# Patient Record
Sex: Male | Born: 1935 | Race: White | Hispanic: No | Marital: Single | Smoking: Former smoker
Health system: Southern US, Community
[De-identification: ages and names within clinical notes are randomized; demographics above are authoritative.]

## PROBLEM LIST (undated history)

## (undated) DIAGNOSIS — N529 Male erectile dysfunction, unspecified: Secondary | ICD-10-CM

## (undated) DIAGNOSIS — N189 Chronic kidney disease, unspecified: Secondary | ICD-10-CM

## (undated) DIAGNOSIS — I340 Nonrheumatic mitral (valve) insufficiency: Secondary | ICD-10-CM

## (undated) DIAGNOSIS — R972 Elevated prostate specific antigen [PSA]: Secondary | ICD-10-CM

## (undated) DIAGNOSIS — N289 Disorder of kidney and ureter, unspecified: Secondary | ICD-10-CM

## (undated) DIAGNOSIS — M549 Dorsalgia, unspecified: Secondary | ICD-10-CM

## (undated) DIAGNOSIS — I2510 Atherosclerotic heart disease of native coronary artery without angina pectoris: Secondary | ICD-10-CM

## (undated) DIAGNOSIS — I4891 Unspecified atrial fibrillation: Secondary | ICD-10-CM

## (undated) DIAGNOSIS — Z9889 Other specified postprocedural states: Secondary | ICD-10-CM

## (undated) DIAGNOSIS — M1990 Unspecified osteoarthritis, unspecified site: Secondary | ICD-10-CM

## (undated) DIAGNOSIS — I499 Cardiac arrhythmia, unspecified: Secondary | ICD-10-CM

## (undated) DIAGNOSIS — E291 Testicular hypofunction: Secondary | ICD-10-CM

## (undated) DIAGNOSIS — Z8679 Personal history of other diseases of the circulatory system: Secondary | ICD-10-CM

## (undated) DIAGNOSIS — I10 Essential (primary) hypertension: Secondary | ICD-10-CM

## (undated) DIAGNOSIS — R0600 Dyspnea, unspecified: Secondary | ICD-10-CM

## (undated) DIAGNOSIS — R400 Somnolence: Secondary | ICD-10-CM

## (undated) DIAGNOSIS — G629 Polyneuropathy, unspecified: Secondary | ICD-10-CM

## (undated) DIAGNOSIS — I071 Rheumatic tricuspid insufficiency: Secondary | ICD-10-CM

## (undated) DIAGNOSIS — R0602 Shortness of breath: Secondary | ICD-10-CM

## (undated) DIAGNOSIS — G8929 Other chronic pain: Secondary | ICD-10-CM

## (undated) DIAGNOSIS — I050 Rheumatic mitral stenosis: Secondary | ICD-10-CM

## (undated) DIAGNOSIS — K219 Gastro-esophageal reflux disease without esophagitis: Secondary | ICD-10-CM

## (undated) DIAGNOSIS — E785 Hyperlipidemia, unspecified: Secondary | ICD-10-CM

## (undated) DIAGNOSIS — I6529 Occlusion and stenosis of unspecified carotid artery: Secondary | ICD-10-CM

## (undated) DIAGNOSIS — J309 Allergic rhinitis, unspecified: Secondary | ICD-10-CM

## (undated) DIAGNOSIS — I517 Cardiomegaly: Secondary | ICD-10-CM

## (undated) DIAGNOSIS — J189 Pneumonia, unspecified organism: Secondary | ICD-10-CM

## (undated) DIAGNOSIS — G4700 Insomnia, unspecified: Secondary | ICD-10-CM

## (undated) DIAGNOSIS — H9190 Unspecified hearing loss, unspecified ear: Secondary | ICD-10-CM

## (undated) HISTORY — DX: Occlusion and stenosis of unspecified carotid artery: I65.29

## (undated) HISTORY — PX: HERNIA REPAIR: SHX51

## (undated) HISTORY — PX: CARDIAC CATHETERIZATION: SHX172

## (undated) HISTORY — DX: Somnolence: R40.0

## (undated) HISTORY — DX: Gastro-esophageal reflux disease without esophagitis: K21.9

## (undated) HISTORY — DX: Unspecified hearing loss, unspecified ear: H91.90

## (undated) HISTORY — DX: Rheumatic tricuspid insufficiency: I07.1

## (undated) HISTORY — DX: Male erectile dysfunction, unspecified: N52.9

## (undated) HISTORY — DX: Chronic kidney disease, unspecified: N18.9

## (undated) HISTORY — DX: Essential (primary) hypertension: I10

## (undated) HISTORY — DX: Cardiomegaly: I51.7

## (undated) HISTORY — DX: Dorsalgia, unspecified: M54.9

## (undated) HISTORY — DX: Elevated prostate specific antigen (PSA): R97.20

## (undated) HISTORY — DX: Nonrheumatic mitral (valve) insufficiency: I34.0

## (undated) HISTORY — DX: Other chronic pain: G89.29

## (undated) HISTORY — PX: LUMBAR SPINE SURGERY: SHX701

## (undated) HISTORY — PX: TONSILLECTOMY: SUR1361

## (undated) HISTORY — DX: Insomnia, unspecified: G47.00

## (undated) HISTORY — DX: Shortness of breath: R06.02

## (undated) HISTORY — DX: Unspecified atrial fibrillation: I48.91

## (undated) HISTORY — DX: Hyperlipidemia, unspecified: E78.5

## (undated) HISTORY — DX: Rheumatic mitral stenosis: I05.0

## (undated) HISTORY — DX: Testicular hypofunction: E29.1

## (undated) HISTORY — DX: Allergic rhinitis, unspecified: J30.9

---

## 1999-09-06 ENCOUNTER — Ambulatory Visit (HOSPITAL_COMMUNITY): Admission: RE | Admit: 1999-09-06 | Discharge: 1999-09-06 | Payer: Self-pay | Admitting: Gastroenterology

## 2002-07-11 ENCOUNTER — Ambulatory Visit (HOSPITAL_COMMUNITY): Admission: RE | Admit: 2002-07-11 | Discharge: 2002-07-11 | Payer: Self-pay | Admitting: Gastroenterology

## 2002-07-11 ENCOUNTER — Encounter: Payer: Self-pay | Admitting: Gastroenterology

## 2007-04-19 ENCOUNTER — Encounter: Admission: RE | Admit: 2007-04-19 | Discharge: 2007-04-19 | Payer: Self-pay | Admitting: Orthopaedic Surgery

## 2007-05-04 ENCOUNTER — Encounter: Admission: RE | Admit: 2007-05-04 | Discharge: 2007-05-04 | Payer: Self-pay | Admitting: Orthopaedic Surgery

## 2007-06-18 ENCOUNTER — Encounter: Admission: RE | Admit: 2007-06-18 | Discharge: 2007-06-18 | Payer: Self-pay | Admitting: Orthopaedic Surgery

## 2007-07-25 ENCOUNTER — Encounter: Admission: RE | Admit: 2007-07-25 | Discharge: 2007-07-25 | Payer: Self-pay | Admitting: Orthopaedic Surgery

## 2007-11-05 ENCOUNTER — Encounter: Admission: RE | Admit: 2007-11-05 | Discharge: 2007-11-05 | Payer: Self-pay | Admitting: Orthopaedic Surgery

## 2008-02-06 ENCOUNTER — Encounter: Admission: RE | Admit: 2008-02-06 | Discharge: 2008-02-06 | Payer: Self-pay | Admitting: Orthopaedic Surgery

## 2009-09-22 ENCOUNTER — Encounter: Payer: Self-pay | Admitting: Internal Medicine

## 2009-10-08 ENCOUNTER — Encounter: Payer: Self-pay | Admitting: Internal Medicine

## 2009-11-09 LAB — PULMONARY FUNCTION TEST

## 2009-12-30 ENCOUNTER — Encounter: Payer: Self-pay | Admitting: Internal Medicine

## 2010-01-22 ENCOUNTER — Encounter: Payer: Self-pay | Admitting: Internal Medicine

## 2010-01-29 ENCOUNTER — Encounter: Payer: Self-pay | Admitting: Internal Medicine

## 2010-02-02 ENCOUNTER — Encounter: Payer: Self-pay | Admitting: Internal Medicine

## 2010-02-09 ENCOUNTER — Encounter: Payer: Self-pay | Admitting: Internal Medicine

## 2010-02-17 ENCOUNTER — Ambulatory Visit: Payer: Self-pay | Admitting: Internal Medicine

## 2010-02-17 DIAGNOSIS — R609 Edema, unspecified: Secondary | ICD-10-CM

## 2010-02-17 DIAGNOSIS — I4891 Unspecified atrial fibrillation: Secondary | ICD-10-CM

## 2010-02-17 HISTORY — DX: Unspecified atrial fibrillation: I48.91

## 2010-02-23 ENCOUNTER — Telehealth: Payer: Self-pay | Admitting: Internal Medicine

## 2010-02-26 ENCOUNTER — Ambulatory Visit: Payer: Self-pay | Admitting: Internal Medicine

## 2010-02-26 ENCOUNTER — Ambulatory Visit (HOSPITAL_COMMUNITY): Admission: RE | Admit: 2010-02-26 | Discharge: 2010-02-26 | Payer: Self-pay | Admitting: Internal Medicine

## 2010-02-26 HISTORY — PX: CARDIOVERSION: SHX1299

## 2010-03-12 ENCOUNTER — Telehealth: Payer: Self-pay | Admitting: Internal Medicine

## 2010-04-13 ENCOUNTER — Ambulatory Visit: Payer: Self-pay | Admitting: Internal Medicine

## 2010-04-13 DIAGNOSIS — I440 Atrioventricular block, first degree: Secondary | ICD-10-CM

## 2010-10-28 NOTE — Assessment & Plan Note (Signed)
Summary: appt @ 4:30/nep/afib/jml   Visit Type:  Initial Consult Referring Provider:  Ruthann Cancer Primary Provider:  Beatriz Stallion  CC:  AFIB.  History of Present Illness: [name] this is a request of MIke Duran because of atrial fibrillation.  this is identified in December. He underwent Myoview scan and echo which were   normal.  with eEF 55% Blood was also obtained and that was also presumably normal. He was started on flecainide and Coumadin. He did not revert. He is now referred for consideration of cardioversion.  He has palpitations sometimes fast. There is accompanying shortness of breath. He was intolerant of it initially started beta blocker undertaken presumably for rate control. This is as with profound shortness of breath. This improved with the discontinuation initiation of Cardizem. However, this is a complicated by edema.  The patient spent a long time fussing about his medications.  Current Medications (verified): 1)  Coumadin 6 Mg Tabs (Warfarin Sodium) .... Uad 2)  Flecainide Acetate 50 Mg Tabs (Flecainide Acetate) .... 2 Tabs Two Times A Day 3)  Cardizem Cd 180 Mg Xr24h-Cap (Diltiazem Hcl Coated Beads) .... Once Daily 4)  Simvastatin 40 Mg Tabs (Simvastatin) .... At Bedtime 5)  Prilosec 20 Mg Cpdr (Omeprazole) .... Take One Capsule Two Times A Day 6)  Proscar 5 Mg Tabs (Finasteride) .... Take One Tablet Once Daily  Allergies (verified): No Known Drug Allergies  Past History:  Past Medical History: Last updated: 02/16/2010 atrial fibrillation CKD SOB erectile dysfunction left ventricular hypertrophy hypertension GERD Hyperlipidemia  Past Surgical History: hernia x2  Family History: Negative FH of Diabetes, Hypertension, or Coronary Artery Disease  Review of Systems       full review of systems was negative apart from a history of present illness and past medical history x glasses, hard of hearing,   Vital Signs:  Patient  profile:   76 year old male Height:      74 inches Weight:      250 pounds BMI:     32.21 Pulse rate:   77 / minute BP sitting:   145 / 93  (left arm) Cuff size:   regular  Vitals Entered By: Lubertha Basque, CNA (Feb 17, 2010 4:37 PM)  Physical Exam  General:  The patient was alert and oriented appearing his stated age in no acute distress.Neck veins were flat, carotids were brisk. Lungs were clear. Heart sounds were irregular with 2/6 murmurs no gallops. Abdomen was soft with active bowel sounds. There is no clubbing cyanosis or edema.affect normal skin warm and dry    EKG  Procedure date:  02/17/2010  Findings:      atiral fibrilaltion 77 LAD/ rsr   Impression & Recommendations:  Problem # 1:  ATRIAL FIBRILLATION (ICD-427.31) the patient has atrial fibrillation that has been persistent since December. Evaluation included an echo and a Myoview which were apparently normal. The patient has symptoms of shortness of breath. The plan is to undertake cardioversion with support from flecainide in hopes of restoring sinus rhythm. We have reviewed the benefits and the risks of cardioversion as well as antiarrhythmic drug therapy.  He has a CHADS VASC score of 2( age.-one, hypertension-one)  long-term Coumadin is appropriate Pradaxa would certainly be a reasonable consideration.  The following medications were removed from the medication list:    Carvedilol 6.25 Mg Tabs (Carvedilol) .Marland Kitchen... Take one tablet two times a day His updated medication list for this problem includes:  Coumadin 6 Mg Tabs (Warfarin sodium) ..... Uad    Flecainide Acetate 50 Mg Tabs (Flecainide acetate) .Marland Kitchen... 2 tabs two times a day  Problem # 2:  EDEMA (ICD-782.3) Edema may well be related to Cardizem. He probably did not tolerate a beta blocker in the past but alternative beta blocker may be a better alternative to Cardizem given the associated side effects. For right now however we will continue him on it.

## 2010-10-28 NOTE — Progress Notes (Signed)
Summary: QUESTIONS REGARDING MEDICATION  Phone Note Call from Patient Call back at Home Phone [phone]   Caller: Patient [phone] Reason for Call: Talk to Nurse Summary of Call: PT HAS QUESTIONS REGARDING FLECAINIDE 50MG  PT TAKING TWO IN AM, TWO AT NIGHT, WANTS TO GO BACK TO ONE IN AM, AND ONE AT NIGHT Initial call taken by: Lorenda Hatchet,  March 12, 2010 11:42 AM  Follow-up for Phone Call        spoke with pt he will continue 100mg  two times a day.  I also set him up for a [place] with Dr Dominic Pea, RN, BSN  March 12, 2010 12:32 PM    New/Updated Medications: FLECAINIDE ACETATE 100 MG TABS (FLECAINIDE ACETATE) one by mouth two times a day Prescriptions: FLECAINIDE ACETATE 100 MG TABS (FLECAINIDE ACETATE) one by mouth two times a day  #60 x 6   Entered by:   Janan Halter, RN, BSN   Authorized by:   Nikki Dom, MD, [facility]   Signed by:   Janan Halter, RN, BSN on 03/12/2010   Method used:   Electronically to        [facility]. [id number]* (retail)       [address]       [place], [place]  [address]       Ph: [phone] or [phone]       Fax: [phone]   RxID:   [id number]

## 2010-10-28 NOTE — Letter (Signed)
Summary: [facility] Cardiology Office Visit Note  [facility] Cardiology Office Visit Note   Imported By: Sallee Provencal 03/20/2010 09:42:43  _____________________________________________________________________  External Attachment:    Type:   Image     Comment:   External Document

## 2010-10-28 NOTE — Progress Notes (Signed)
Summary: set up cardioversion  Phone Note Call from Patient Call back at [place]: Patient Reason for Call: Talk to Nurse, Talk to Doctor Summary of Call: pt rtning call from last week to set up cardioversion. please call cell number it is the only number he can hear you on Initial call taken by: Shelda Pal,  Feb 23, 2010 9:33 AM  Follow-up for Phone Call        DCCV scheduled for 02-26-10 at [facility].  Instructions reviewed with patient. Chanetta Marshall RN BSN  Feb 23, 2010 4:10 PM

## 2010-10-28 NOTE — Progress Notes (Signed)
Summary: [facility] Med List   Imported By: Sallee Provencal 03/20/2010 09:49:15  _____________________________________________________________________  External Attachment:    Type:   Image     Comment:   External Document

## 2010-10-28 NOTE — Assessment & Plan Note (Signed)
Summary: appt @ 10:45/eph/per kelly   Visit Type:  Follow-up Referring Provider:  Ruthann Cancer Primary Provider:  [place]   History of Present Illness: [name] is seen in followup for atrial fibrillation in the state of normal left ventricular function and a nonischemic Myoview who underwent cardioversion supported by flecainide in May. He has been holding sinus rhythm.  He however continues to complain of feeling lousy. He dates this back to the onset of the medications that he is taking specifically the calcium blockers, statins, and warfarin.  He also describes a vague fleeting chest pain which he uses a fist to describe. These episodes last only seconds. They're unrelated to exertion.  He also has a headache over the last couple of days.  Current Medications (verified): 1)  Coumadin 6 Mg Tabs (Warfarin Sodium) .... Uad 2)  Flecainide Acetate 50 Mg Tabs (Flecainide Acetate) .... 2 Tabs Two Times A Day 3)  Cardizem Cd 180 Mg Xr24h-Cap (Diltiazem Hcl Coated Beads) .... Once Daily 4)  Simvastatin 40 Mg Tabs (Simvastatin) .... At Bedtime 5)  Prilosec 20 Mg Cpdr (Omeprazole) .... Take One Capsule Two Times A Day 6)  Proscar 5 Mg Tabs (Finasteride) .... Take One Tablet Once Daily 7)  Flecainide Acetate 100 Mg Tabs (Flecainide Acetate) .... One By Mouth Two Times A Day  Allergies (verified): No Known Drug Allergies  Past History:  Past Medical History: Last updated: 02/16/2010 atrial fibrillation CKD SOB erectile dysfunction left ventricular hypertrophy hypertension GERD Hyperlipidemia  Past Surgical History: Last updated: 02/17/2010 hernia x2  Family History: Last updated: 02/17/2010 Negative FH of Diabetes, Hypertension, or Coronary Artery Disease  Social History: Last updated: 02/16/2010 Tobacco Use - Former. -remote hx Alcohol Use - yes-occasional  Vital Signs:  Patient profile:   76 year old male Height:      74 inches Weight:       244 pounds BMI:     31.44 Pulse rate:   72 / minute Pulse rhythm:   regular BP sitting:   128 / 79  (left arm) Cuff size:   large  Vitals Entered By: Doug Sou CMA (April 13, 2010 10:03 AM)  Physical Exam  General:  The patient was alert and oriented in no acute distress. HEENT Normal.  Neck veins were flat, carotids were brisk.  Lungs were clear.  Heart sounds were regular without murmurs or gallops.  Abdomen was soft with active bowel sounds. There is no clubbing cyanosis or edema. Skin Warm and dry \\par   Impression & Recommendations:  Problem # 1:  FATIGUE (ICD-780.79) the patient overall feels pretty lousy. He dates this back to his medications. We will begin by eliminating his Cardizem and his statin and see how he does. We'll put him on a different rate controlling agent specifically verapamil. He'll let us know how he feels. The potential for the contributor is the fact that he has noted first degree AV block which may be have been made worse by the fleck at night. We don't have a previous electrocardiograms no what is PR interval is. We'll need to keep a track on this. I suspect that this is not a major contributor  Problem # 2:  AV BLOCK, 1ST DEGREE (ICD-426.11) as noted above this may be a consequence of the fleck tonight. Will have to follow it over time. The following medications were removed from the medication list:    Flecainide Acetate 50 Mg Tabs (Flecainide acetate) .Marland Kitchen... 2 tabs two times  a day His updated medication list for this problem includes:    Coumadin 6 Mg Tabs (Warfarin sodium) ..... Uad    Verapamil Hcl Cr 180 Mg Xr24h-cap (Verapamil hcl) .Marland Kitchen... Take one capsule by mouth daily    Flecainide Acetate 100 Mg Tabs (Flecainide acetate) ..... One by mouth two times a day    Aspirin 81 Mg Tabs (Aspirin)  Problem # 3:  ATRIAL FIBRILLATION (ICD-427.31) he is holding sinus rhythm on his fleck tonight. We'll continue it The following medications were removed  from the medication list:    Flecainide Acetate 50 Mg Tabs (Flecainide acetate) .Marland Kitchen... 2 tabs two times a day His updated medication list for this problem includes:    Coumadin 6 Mg Tabs (Warfarin sodium) ..... Uad    Flecainide Acetate 100 Mg Tabs (Flecainide acetate) ..... One by mouth two times a day    Aspirin 81 Mg Tabs (Aspirin)  Problem # 4:  HEADACHE (ICD-784.0) his headache may be a consequence of the flecainide. He is advised to let us know if it persists His updated medication list for this problem includes:    Aspirin 81 Mg Tabs (Aspirin)  Patient Instructions: 1)  Your physician has recommended you make the following change in your medication: Stop Simvastatin, Decrease Prilosec to 1 capsule daily. 2)  Your physician wants you to follow-up in: 6 months with Dr Caryl Comes.  You will receive a reminder letter in the mail two months in advance. If you don't receive a letter, please call our office to schedule the follow-up appointment. 3)  Follow up with Roque Cash as scheduled. Prescriptions: VERAPAMIL HCL CR 180 MG XR24H-CAP (VERAPAMIL HCL) Take one capsule by mouth daily  #30 x 11   Entered by:   Advertising account executive BSN   Authorized by:   Nikki Dom, MD, [facility]   Signed by:   Chanetta Marshall RN BSN on 04/13/2010   Method used:   Electronically to        [facility]. [id number]* (retail)       [address]       [place], [place]  [address]       Ph: [phone] or [phone]       Fax: [phone]   RxID:   [id number]   Appended Document: [facility]     Allergies: No Known Drug Allergies   EKG  Procedure date:  04/13/2010  Findings:      sinus rhythm at 72 Intervals 0.25/0.12/0.43 Axis is leftward at - the Nonspecific ST-T changes nextline R. prime in V1

## 2010-12-13 LAB — BASIC METABOLIC PANEL
BUN: 19 mg/dL (ref 6–23)
Chloride: 106 mEq/L (ref 96–112)
Creatinine, Ser: 1.62 mg/dL — ABNORMAL HIGH (ref 0.4–1.5)
Glucose, Bld: 91 mg/dL (ref 70–99)

## 2010-12-13 LAB — CBC
MCHC: 34.8 g/dL (ref 30.0–36.0)
MCV: 94.6 fL (ref 78.0–100.0)
Platelets: 102 10*3/uL — ABNORMAL LOW (ref 150–400)
RDW: 14.3 % (ref 11.5–15.5)
WBC: 7.7 10*3/uL (ref 4.0–10.5)

## 2010-12-13 LAB — PROTIME-INR: Prothrombin Time: 25.7 seconds — ABNORMAL HIGH (ref 11.6–15.2)

## 2011-02-11 ENCOUNTER — Other Ambulatory Visit: Payer: Self-pay | Admitting: Internal Medicine

## 2011-10-05 ENCOUNTER — Encounter: Payer: Self-pay | Admitting: Cardiovascular Disease

## 2011-10-06 ENCOUNTER — Encounter: Payer: Self-pay | Admitting: Cardiovascular Disease

## 2011-10-19 ENCOUNTER — Encounter: Payer: Self-pay | Admitting: *Deleted

## 2011-10-21 ENCOUNTER — Encounter: Payer: Self-pay | Admitting: Cardiovascular Disease

## 2011-10-21 ENCOUNTER — Ambulatory Visit (INDEPENDENT_AMBULATORY_CARE_PROVIDER_SITE_OTHER): Payer: Self-pay | Admitting: Cardiovascular Disease

## 2011-10-21 VITALS — BP 140/96 | HR 63 | Ht 74.5 in | Wt 242.0 lb

## 2011-10-21 DIAGNOSIS — I4891 Unspecified atrial fibrillation: Secondary | ICD-10-CM

## 2011-10-21 DIAGNOSIS — R0602 Shortness of breath: Secondary | ICD-10-CM

## 2011-10-21 LAB — BASIC METABOLIC PANEL
BUN: 26 mg/dL — ABNORMAL HIGH (ref 6–23)
Calcium: 9.1 mg/dL (ref 8.4–10.5)
Creatinine, Ser: 1.6 mg/dL — ABNORMAL HIGH (ref 0.4–1.5)
GFR: 44.48 mL/min — ABNORMAL LOW (ref >60.00)
Glucose, Bld: 93 mg/dL (ref 70–99)
Sodium: 139 mEq/L (ref 135–145)

## 2011-10-21 LAB — CBC WITH DIFFERENTIAL/PLATELET
Basophils Absolute: 0 10*3/uL (ref 0.0–0.1)
Eosinophils Absolute: 0.2 10*3/uL (ref 0.0–0.7)
Lymphocytes Relative: 17.5 % (ref 12.0–46.0)
MCHC: 33.9 g/dL (ref 30.0–36.0)
Monocytes Relative: 8.3 % (ref 3.0–12.0)
Neutrophils Relative %: 72 % (ref 43.0–77.0)
Platelets: 132 10*3/uL — ABNORMAL LOW (ref 150.0–400.0)
RDW: 13.7 % (ref 11.5–14.6)

## 2011-10-21 LAB — PROTIME-INR: Prothrombin Time: 21 s — ABNORMAL HIGH (ref 10.2–12.4)

## 2011-10-21 NOTE — Progress Notes (Signed)
HPI:  This is a 77 year old gentleman referred for evaluation of an abnormal echocardiogram and for consideration of cardiac catheterization. The patient has been seen in our practice for paroxysmal atrial fibrillation. He has previously been maintained on flecainide and warfarin. He is now taking Xarelto for anticoagulation.   The patient has been evident great deal of difficulty with shortness of breath, profound fatigue, abdominal fullness, and episodic chest pain. He becomes very dyspneic with bending forward. He has previously been active but has progressive dyspnea with exertion over the last 6 months. He can walk at a slow pace but is no longer able to walk up an incline or a brisk pace. He has had episodes of left-sided chest pain intermittently related to exertion but sometimes at rest. He denies leg edema but feels abdominal swelling.  The patient has undergone evaluation at [facility] with an echocardiogram showing four-chamber cardiac enlargement with an ejection fraction of 55%. There was mild hypokinesis of the apex and anteroapical extending into the lateral apical segment. There was also notation of moderate to severe left ventricular hypertrophy with at least moderate diastolic dysfunction. There was no pericardial effusion noted in the aortic root size appeared normal. There was moderate mitral regurgitation and moderate tricuspid regurgitation with report of probable elevation of his pulmonary pressures. The patient also underwent a nuclear stress test showing no ischemia. The test was reported as "subjectively positive with 2/10 chest pain."  Outpatient Encounter Prescriptions as of 10/21/2011  Medication Sig Dispense Refill  . aspirin 81 MG tablet Take 81 mg by mouth daily.      . carvedilol (COREG) 3.125 MG tablet Take 3.125 mg by mouth 2 (two) times daily with a meal.       . flecainide (TAMBOCOR) 100 MG tablet       . NITROSTAT 0.4 MG SL tablet Place 0.4 mg under the  tongue every 5 (five) minutes as needed.       Marland Kitchen omeprazole (PRILOSEC) 20 MG capsule Take 20 mg by mouth 2 (two) times daily.       . Rivaroxaban (XARELTO) 20 MG TABS Take 10 mg by mouth daily.      . Tamsulosin HCl (FLOMAX) 0.4 MG CAPS 0.4 mg at bedtime.       . verapamil (COVERA HS) 180 MG (CO) 24 hr tablet Take 120 mg by mouth at bedtime.       Marland Kitchen DISCONTD: flecainide (TAMBOCOR) 100 MG tablet TAKE 1 TABLET BY MOUTH TWICE A DAY  60 tablet  1  . DISCONTD: finasteride (PROSCAR) 5 MG tablet Take 5 mg by mouth daily.      Marland Kitchen DISCONTD: fish oil-omega-3 fatty acids 1000 MG capsule Take 1 g by mouth daily.      Marland Kitchen DISCONTD: warfarin (COUMADIN) 6 MG tablet Take 6 mg by mouth daily.        Review of patient's allergies indicates no known allergies.  Past Medical History  Diagnosis Date  . AF (atrial fibrillation)   . CKD (chronic kidney disease)   . SOB (shortness of breath)   . Erectile dysfunction   . Left ventricular hypertrophy   . HTN (hypertension)   . GERD (gastroesophageal reflux disease)   . Hyperlipidemia     No past surgical history on file.  History   Social History  . Marital Status: Married    Spouse Name: N/A    Number of Children: N/A  . Years of Education: N/A   Occupational History  .  Not on file.   Social History Main Topics  . Smoking status: Never Smoker   . Smokeless tobacco: Not on file  . Alcohol Use: Not on file  . Drug Use: Not on file  . Sexually Active: Not on file   Other Topics Concern  . Not on file   Social History Narrative  . No narrative on file    Family History  Problem Relation Age of Onset  . Diabetes    . Hypertension    . Coronary artery disease      ROS: General: no fevers/chills/night sweats, positive for generalized fatigue Eyes: no blurry vision, diplopia, or amaurosis ENT: no sore throat or hearing loss Resp: no cough, wheezing, or hemoptysis CV: no edema, positive for palpitations GI: no abdominal pain, nausea,  vomiting, diarrhea, or constipation. Positive for abdominal swelling GU: no dysuria, frequency, or hematuria Skin: no rash Neuro: no headache, numbness, tingling, or weakness of extremities Musculoskeletal: no joint pain or swelling Heme: no bleeding, DVT, or easy bruising Endo: no polydipsia or polyuria  BP 140/96  Pulse 63  Ht 6' 2.5" (1.892 m)  Wt 109.77 kg (242 lb)  BMI 30.66 kg/m2  PHYSICAL EXAM: Pt is alert and oriented, WD, WN, overweight male in no distress. HEENT: normal Neck: JVP normal. Carotid upstrokes normal without bruits. No thyromegaly. Lungs: equal expansion, clear bilaterally CV: Apex is discrete and nondisplaced, RRR without murmur or gallop Abd: soft, NT, +BS, no bruit, no hepatosplenomegaly, abdominal distention noted  Back: no CVA tenderness Ext: no C/C/E        Femoral pulses 2+= without bruits        DP/PT pulses intact and = Skin: warm and dry without rash Neuro: CNII-XII intact             Strength intact = bilaterally  EKG:  Normal sinus rhythm with first degree AV block 63 beats per minute, LVH with QRS widening, ST and T wave abnormality consider anterior ischemia.  ASSESSMENT AND PLAN:

## 2011-10-21 NOTE — Assessment & Plan Note (Signed)
I reviewed the patient's data, specifically his echocardiogram and nuclear stress test results. He has marked limitation in physical activity related to dyspnea and fatigue. He does have abdominal fullness on exam and elevation of his pulmonary pressures by echocardiogram.  Differential diagnosis includes coronary heart disease, hypertensive heart disease, and pericardial constriction. I have recommended a right and left heart catheterization for definitive evaluation. I reviewed the risks, indication, and alternatives to cardiac catheterization and possible PCI and the patient understands and agrees to proceed.

## 2011-10-21 NOTE — Patient Instructions (Signed)
Your physician has requested that you have a cardiac catheterization. Cardiac catheterization is used to diagnose and/or treat various heart conditions. Doctors may recommend this procedure for a number of different reasons. The most common reason is to evaluate chest pain. Chest pain can be a symptom of coronary artery disease (CAD), and cardiac catheterization can show whether plaque is narrowing or blocking your heart's arteries. This procedure is also used to evaluate the valves, as well as measure the blood flow and oxygen levels in different parts of your heart. For further information please visit [facility]. Please follow instruction sheet, as given.  Your physician recommends that you have lab work today: BMP, CBC, PT/INR

## 2011-10-21 NOTE — Assessment & Plan Note (Signed)
The patient is maintaining sinus rhythm on flecainide. He is anticoagulated with Xarelto. I have asked him to hold Xarelto 2 days before his cardiac catheterization.

## 2011-10-26 ENCOUNTER — Other Ambulatory Visit: Payer: Self-pay | Admitting: Cardiovascular Disease

## 2011-10-27 ENCOUNTER — Inpatient Hospital Stay (HOSPITAL_BASED_OUTPATIENT_CLINIC_OR_DEPARTMENT_OTHER)
Admission: RE | Admit: 2011-10-27 | Discharge: 2011-10-27 | Disposition: A | Discharge status: Home / Self Care | Payer: Medicare PPO | Source: Ambulatory Visit | Attending: Cardiovascular Disease | Admitting: Cardiovascular Disease

## 2011-10-27 ENCOUNTER — Encounter (HOSPITAL_BASED_OUTPATIENT_CLINIC_OR_DEPARTMENT_OTHER)
Admission: RE | Disposition: A | Discharge status: Home / Self Care | Payer: Self-pay | Source: Ambulatory Visit | Attending: Cardiovascular Disease

## 2011-10-27 DIAGNOSIS — R0989 Other specified symptoms and signs involving the circulatory and respiratory systems: Secondary | ICD-10-CM | POA: Insufficient documentation

## 2011-10-27 DIAGNOSIS — R0609 Other forms of dyspnea: Secondary | ICD-10-CM | POA: Insufficient documentation

## 2011-10-27 DIAGNOSIS — I2510 Atherosclerotic heart disease of native coronary artery without angina pectoris: Secondary | ICD-10-CM | POA: Insufficient documentation

## 2011-10-27 LAB — POCT I-STAT 3, VENOUS BLOOD GAS (G3P V)
O2 Saturation: 64 %
pCO2, Ven: 40.8 mmHg — ABNORMAL LOW (ref 45.0–50.0)
pH, Ven: 7.391 — ABNORMAL HIGH (ref 7.250–7.300)

## 2011-10-27 LAB — POCT I-STAT 3, ART BLOOD GAS (G3+)
Bicarbonate: 23.5 mEq/L (ref 20.0–24.0)
TCO2: 25 mmol/L (ref 0–100)
pCO2 arterial: 35.6 mmHg (ref 35.0–45.0)
pH, Arterial: 7.428 (ref 7.350–7.450)

## 2011-10-27 SURGERY — JV LEFT AND RIGHT HEART CATHETERIZATION WITH CORONARY ANGIOGRAM
Anesthesia: Moderate Sedation

## 2011-10-27 MED ORDER — ACETAMINOPHEN 325 MG PO TABS: 650.0000 mg | ORAL_TABLET | ORAL | Status: DC | PRN

## 2011-10-27 MED ORDER — ASPIRIN 81 MG PO CHEW
324.0000 mg | CHEWABLE_TABLET | ORAL | Status: AC
  Administered 2011-10-27: 324 mg via ORAL

## 2011-10-27 MED ORDER — ONDANSETRON HCL 4 MG/2ML IJ SOLN: 4.0000 mg | Freq: Four times a day (QID) | INTRAMUSCULAR | Status: DC | PRN

## 2011-10-27 MED ORDER — SODIUM CHLORIDE 0.9 % IV SOLN: 1.0000 mL/kg/h | INTRAVENOUS | Status: DC

## 2011-10-27 MED ORDER — SODIUM CHLORIDE 0.9 % IJ SOLN: 3.0000 mL | Freq: Two times a day (BID) | INTRAMUSCULAR | Status: DC

## 2011-10-27 MED ORDER — SODIUM CHLORIDE 0.9 % IV SOLN: 250.0000 mL | INTRAVENOUS | Status: DC | PRN

## 2011-10-27 MED ORDER — SODIUM CHLORIDE 0.9 % IV SOLN
INTRAVENOUS | Status: DC
  Administered 2011-10-27: 11:00:00 via INTRAVENOUS

## 2011-10-27 MED ORDER — SODIUM CHLORIDE 0.9 % IJ SOLN: 3.0000 mL | INTRAMUSCULAR | Status: DC | PRN

## 2011-10-27 NOTE — Interval H&P Note (Signed)
History and Physical Interval Note:  10/27/2011 12:22 PM  Daniel Mayo  has presented today for surgery, with the diagnosis of SOB  The various methods of treatment have been discussed with the patient and family. After consideration of risks, benefits and other options for treatment, the patient has consented to  Procedure(s): JV LEFT AND RIGHT HEART CATHETERIZATION WITH CORONARY ANGIOGRAM as a surgical intervention .  The patients' history has been reviewed, patient examined, no change in status, stable for surgery.  I have reviewed the patients' chart and labs.  Questions were answered to the patient's satisfaction.     Sherren Mocha

## 2011-10-27 NOTE — H&P (View-Only) (Signed)
HPI:  This is a 77 year old gentleman referred for evaluation of an abnormal echocardiogram and for consideration of cardiac catheterization. The patient has been seen in our practice for paroxysmal atrial fibrillation. He has previously been maintained on flecainide and warfarin. He is now taking Xarelto for anticoagulation.   The patient has been evident great deal of difficulty with shortness of breath, profound fatigue, abdominal fullness, and episodic chest pain. He becomes very dyspneic with bending forward. He has previously been active but has progressive dyspnea with exertion over the last 6 months. He can walk at a slow pace but is no longer able to walk up an incline or a brisk pace. He has had episodes of left-sided chest pain intermittently related to exertion but sometimes at rest. He denies leg edema but feels abdominal swelling.  The patient has undergone evaluation at [facility] with an echocardiogram showing four-chamber cardiac enlargement with an ejection fraction of 55%. There was mild hypokinesis of the apex and anteroapical extending into the lateral apical segment. There was also notation of moderate to severe left ventricular hypertrophy with at least moderate diastolic dysfunction. There was no pericardial effusion noted in the aortic root size appeared normal. There was moderate mitral regurgitation and moderate tricuspid regurgitation with report of probable elevation of his pulmonary pressures. The patient also underwent a nuclear stress test showing no ischemia. The test was reported as "subjectively positive with 2/10 chest pain."  Outpatient Encounter Prescriptions as of 10/21/2011  Medication Sig Dispense Refill  . aspirin 81 MG tablet Take 81 mg by mouth daily.      . carvedilol (COREG) 3.125 MG tablet Take 3.125 mg by mouth 2 (two) times daily with a meal.       . flecainide (TAMBOCOR) 100 MG tablet       . NITROSTAT 0.4 MG SL tablet Place 0.4 mg under the  tongue every 5 (five) minutes as needed.       Marland Kitchen omeprazole (PRILOSEC) 20 MG capsule Take 20 mg by mouth 2 (two) times daily.       . Rivaroxaban (XARELTO) 20 MG TABS Take 10 mg by mouth daily.      . Tamsulosin HCl (FLOMAX) 0.4 MG CAPS 0.4 mg at bedtime.       . verapamil (COVERA HS) 180 MG (CO) 24 hr tablet Take 120 mg by mouth at bedtime.       Marland Kitchen DISCONTD: flecainide (TAMBOCOR) 100 MG tablet TAKE 1 TABLET BY MOUTH TWICE A DAY  60 tablet  1  . DISCONTD: finasteride (PROSCAR) 5 MG tablet Take 5 mg by mouth daily.      Marland Kitchen DISCONTD: fish oil-omega-3 fatty acids 1000 MG capsule Take 1 g by mouth daily.      Marland Kitchen DISCONTD: warfarin (COUMADIN) 6 MG tablet Take 6 mg by mouth daily.        Review of patient's allergies indicates no known allergies.  Past Medical History  Diagnosis Date  . AF (atrial fibrillation)   . CKD (chronic kidney disease)   . SOB (shortness of breath)   . Erectile dysfunction   . Left ventricular hypertrophy   . HTN (hypertension)   . GERD (gastroesophageal reflux disease)   . Hyperlipidemia     No past surgical history on file.  History   Social History  . Marital Status: Married    Spouse Name: N/A    Number of Children: N/A  . Years of Education: N/A   Occupational History  .  Not on file.   Social History Main Topics  . Smoking status: Never Smoker   . Smokeless tobacco: Not on file  . Alcohol Use: Not on file  . Drug Use: Not on file  . Sexually Active: Not on file   Other Topics Concern  . Not on file   Social History Narrative  . No narrative on file    Family History  Problem Relation Age of Onset  . Diabetes    . Hypertension    . Coronary artery disease      ROS: General: no fevers/chills/night sweats, positive for generalized fatigue Eyes: no blurry vision, diplopia, or amaurosis ENT: no sore throat or hearing loss Resp: no cough, wheezing, or hemoptysis CV: no edema, positive for palpitations GI: no abdominal pain, nausea,  vomiting, diarrhea, or constipation. Positive for abdominal swelling GU: no dysuria, frequency, or hematuria Skin: no rash Neuro: no headache, numbness, tingling, or weakness of extremities Musculoskeletal: no joint pain or swelling Heme: no bleeding, DVT, or easy bruising Endo: no polydipsia or polyuria  BP 140/96  Pulse 63  Ht 6' 2.5" (1.892 m)  Wt 109.77 kg (242 lb)  BMI 30.66 kg/m2  PHYSICAL EXAM: Pt is alert and oriented, WD, WN, overweight male in no distress. HEENT: normal Neck: JVP normal. Carotid upstrokes normal without bruits. No thyromegaly. Lungs: equal expansion, clear bilaterally CV: Apex is discrete and nondisplaced, RRR without murmur or gallop Abd: soft, NT, +BS, no bruit, no hepatosplenomegaly, abdominal distention noted  Back: no CVA tenderness Ext: no C/C/E        Femoral pulses 2+= without bruits        DP/PT pulses intact and = Skin: warm and dry without rash Neuro: CNII-XII intact             Strength intact = bilaterally  EKG:  Normal sinus rhythm with first degree AV block 63 beats per minute, LVH with QRS widening, ST and T wave abnormality consider anterior ischemia.  ASSESSMENT AND PLAN:

## 2011-10-27 NOTE — Progress Notes (Signed)
Bedrest begins @ 1300.  Dr. Burt Knack in to discuss results with patient and friend.

## 2011-10-27 NOTE — Op Note (Signed)
Cardiac Catheterization Procedure Note  Name: Daniel Mayo MRN: [id number] DOB: [birth year]  Procedure: Right Heart Cath, Left Heart Cath, Selective Coronary Angiography, LV angiography  Indication: Abnormal echo and stress test, evaluation of dyspnea.   Procedural Details: The right groin was prepped, draped, and anesthetized with 1% lidocaine. Using the modified Seldinger technique a 4 French sheath was placed in the right femoral artery and a 6 French sheath was placed in the right femoral vein. A multipurpose catheter was used for the right heart catheterization. Standard protocol was followed for recording of right heart pressures and sampling of oxygen saturations. Fick cardiac output was calculated. Standard Judkins catheters were used for selective coronary angiography. Left ventriculography was deferred because of renal insufficiency. There were no immediate procedural complications. The patient was transferred to the post catheterization recovery area for further monitoring.  Procedural Findings: Hemodynamics RA 6 RV 27/10 PA 25/11 mean 17 PCWP 17 LV 142/18 AO 140/76 mean 102  Oxygen saturations: PA 64 AO 96  Cardiac Output (Fick) 4.5  Cardiac Index (Fick) 1.9   Coronary angiography: Coronary dominance: right  Left mainstem: The left main is widely patent in its proximal segment. The midportion of the left main tapers with a 30-40% stenosis and calcification along the roof of the left main.  Left anterior descending (LAD): There is heavy calcification of the proximal LAD. The vessel has minor diffuse disease with 30-40% stenoses. There is a large first diagonal branch with no significant stenosis. The vessel is widely patent.  Left circumflex (LCx): The left circumflex is patent. There is no significant obstructive disease. The first obtuse marginal branch is patent and is small in caliber. There is a moderate caliber posterolateral branch off of the distal AV  groove circumflex with no significant stenosis.  Right coronary artery (RCA): The right coronary artery is nondominant. It supplies 2 branches to the RV margin. There is no high-grade obstruction present.  Final Conclusions:   1. Diffuse nonobstructive coronary artery disease 2. Essentially normal right heart hemodynamics  Recommendations: I do not see a cardiac cause of the patient's dyspnea. He will be started back on Xarelto tomorrow for chronic anticoagulation for his paroxysmal atrial fibrillation.  Sherren Mocha 10/27/2011, 12:24 PM

## 2011-10-27 NOTE — Progress Notes (Signed)
Discharge instructions completed, ambulated to bathroom without bleeding from right groin site.  Discharged to home via wheelchair with significant other.

## 2011-10-29 ENCOUNTER — Emergency Department (HOSPITAL_COMMUNITY)
Admission: EM | Admit: 2011-10-29 | Discharge: 2011-10-29 | Disposition: A | Discharge status: Home / Self Care | Payer: Medicare PPO | Attending: Emergency Medicine | Admitting: Emergency Medicine

## 2011-10-29 ENCOUNTER — Encounter (HOSPITAL_COMMUNITY): Payer: Self-pay | Admitting: *Deleted

## 2011-10-29 DIAGNOSIS — G8918 Other acute postprocedural pain: Secondary | ICD-10-CM | POA: Insufficient documentation

## 2011-10-29 DIAGNOSIS — S301XXA Contusion of abdominal wall, initial encounter: Secondary | ICD-10-CM | POA: Insufficient documentation

## 2011-10-29 DIAGNOSIS — I4891 Unspecified atrial fibrillation: Secondary | ICD-10-CM | POA: Insufficient documentation

## 2011-10-29 DIAGNOSIS — F430 Acute stress reaction: Secondary | ICD-10-CM | POA: Insufficient documentation

## 2011-10-29 DIAGNOSIS — I2510 Atherosclerotic heart disease of native coronary artery without angina pectoris: Secondary | ICD-10-CM | POA: Insufficient documentation

## 2011-10-29 DIAGNOSIS — E785 Hyperlipidemia, unspecified: Secondary | ICD-10-CM | POA: Insufficient documentation

## 2011-10-29 DIAGNOSIS — Y840 Cardiac catheterization as the cause of abnormal reaction of the patient, or of later complication, without mention of misadventure at the time of the procedure: Secondary | ICD-10-CM | POA: Insufficient documentation

## 2011-10-29 DIAGNOSIS — N189 Chronic kidney disease, unspecified: Secondary | ICD-10-CM | POA: Insufficient documentation

## 2011-10-29 HISTORY — DX: Atherosclerotic heart disease of native coronary artery without angina pectoris: I25.10

## 2011-10-29 HISTORY — DX: Disorder of kidney and ureter, unspecified: N28.9

## 2011-10-29 NOTE — ED Provider Notes (Signed)
Medical screening examination/treatment/procedure(s) were performed by non-physician practitioner and as supervising physician I was immediately available for consultation/collaboration.  Threasa Beards, MD 10/29/11 [phone]

## 2011-10-29 NOTE — ED Provider Notes (Signed)
History     CSN: [id number]  Arrival date & time 10/29/11  0113   First MD Initiated Contact with Patient 10/29/11 0157      Chief Complaint  Patient presents with  . groin bruise     (Consider location/radiation/quality/duration/timing/severity/associated sxs/prior treatment) HPI Comments: Daniel Mayo had a cardiac catheterization in the right groin 2 days ago when he took the dressing off in the shower tonight.  He noticed some bruising above the site was told to report to the emergency room for any bruising; EMS to transport him is not having any pain is not having any pain or tenderness in his testicle, nor flank pain.  Is not weak, dizzy, feeling short of breath or tachycardic on exertion  The history is provided by the patient.    Past Medical History  Diagnosis Date  . AF (atrial fibrillation)   . CKD (chronic kidney disease)   . SOB (shortness of breath)   . Erectile dysfunction   . Left ventricular hypertrophy   . GERD (gastroesophageal reflux disease)   . Hyperlipidemia   . Coronary artery disease   . Renal disorder     History reviewed. No pertinent past surgical history.  Family History  Problem Relation Age of Onset  . Diabetes    . Hypertension    . Coronary artery disease      History  Substance Use Topics  . Smoking status: Never Smoker   . Smokeless tobacco: Not on file  . Alcohol Use: No      Review of Systems  Constitutional: Negative for fever.  Cardiovascular: Negative for chest pain, palpitations and leg swelling.  Skin: Positive for wound.    Allergies  Review of patient's allergies indicates no known allergies.  Home Medications   Current Outpatient Rx  Name Route Sig Dispense Refill  . CARVEDILOL 3.125 MG PO TABS Oral Take 3.125 mg by mouth 2 (two) times daily with a meal.     . FLECAINIDE ACETATE 100 MG PO TABS Oral Take 100 mg by mouth at bedtime.     Marland Kitchen NITROSTAT 0.4 MG SL SUBL Sublingual Place 0.4 mg under the tongue every 5  (five) minutes as needed.     Marland Kitchen OMEPRAZOLE 20 MG PO CPDR Oral Take 20 mg by mouth 2 (two) times daily.     Marland Kitchen RIVAROXABAN 20 MG PO TABS Oral Take 10 mg by mouth daily.    Marland Kitchen TAMSULOSIN HCL 0.4 MG PO CAPS  0.4 mg at bedtime.     Marland Kitchen VERAPAMIL HCL ER (CO) 180 MG PO TB24 Oral Take 120 mg by mouth at bedtime.       BP 169/95  Pulse 67  Temp(Src) 97.4 F (36.3 C) (Oral)  Resp 18  SpO2 96%  Physical Exam  Constitutional: He is oriented to person, place, and time. He appears well-developed and well-nourished.  HENT:  Head: Normocephalic.  Eyes: Pupils are equal, round, and reactive to light.  Neck: Normal range of motion.  Cardiovascular: Normal rate.   Pulmonary/Chest: Effort normal.  Genitourinary: Testes normal.  Musculoskeletal: Normal range of motion.  Neurological: He is alert and oriented to person, place, and time.  Skin: Skin is warm.  Psychiatric: He has a normal mood and affect.    ED Course  Procedures (including critical care time)  Labs Reviewed - No data to display No results found.   No diagnosis found.    MDM  Localized bruising, post cardiac catheterization with out pseudoaneurysm.  Flank pain, testicular pain, weakness, dizziness, shortness of breath, tachycardia Will contact with our cardiology to report findings and nitroglycerin.  Patient has adequate followup Patient states he is tired of waiting and will follow up with his cardiologist himself       Garald Balding, NP 10/29/11 0310

## 2011-10-29 NOTE — ED Notes (Signed)
The pt is alert no other complaints. No pain.

## 2011-10-29 NOTE — ED Notes (Signed)
The pt had a cardiac cath yesterday.  For the past 2 hours he has had  Bruises to the rt groin.  No pain and this is the fist bruise he has had

## 2013-07-12 DIAGNOSIS — I340 Nonrheumatic mitral (valve) insufficiency: Secondary | ICD-10-CM | POA: Insufficient documentation

## 2013-07-12 HISTORY — DX: Nonrheumatic mitral (valve) insufficiency: I34.0

## 2013-07-15 ENCOUNTER — Institutional Professional Consult (permissible substitution) (INDEPENDENT_AMBULATORY_CARE_PROVIDER_SITE_OTHER): Payer: Medicare PPO | Admitting: Thoracic Surgery (Cardiothoracic Vascular Surgery)

## 2013-07-15 ENCOUNTER — Encounter: Payer: Self-pay | Admitting: Thoracic Surgery (Cardiothoracic Vascular Surgery)

## 2013-07-15 VITALS — BP 142/84 | HR 72 | Resp 20 | Ht 74.0 in | Wt 235.0 lb

## 2013-07-15 DIAGNOSIS — I10 Essential (primary) hypertension: Secondary | ICD-10-CM | POA: Insufficient documentation

## 2013-07-15 DIAGNOSIS — G8929 Other chronic pain: Secondary | ICD-10-CM | POA: Insufficient documentation

## 2013-07-15 DIAGNOSIS — I059 Rheumatic mitral valve disease, unspecified: Secondary | ICD-10-CM

## 2013-07-15 DIAGNOSIS — N189 Chronic kidney disease, unspecified: Secondary | ICD-10-CM | POA: Insufficient documentation

## 2013-07-15 DIAGNOSIS — M549 Dorsalgia, unspecified: Secondary | ICD-10-CM

## 2013-07-15 DIAGNOSIS — I4891 Unspecified atrial fibrillation: Secondary | ICD-10-CM

## 2013-07-15 DIAGNOSIS — I340 Nonrheumatic mitral (valve) insufficiency: Secondary | ICD-10-CM | POA: Insufficient documentation

## 2013-07-15 DIAGNOSIS — E785 Hyperlipidemia, unspecified: Secondary | ICD-10-CM

## 2013-07-15 NOTE — H&P (Addendum)
[facility]       [place] [address]             [phone]     CARDIOTHORACIC SURGERY CONSULTATION REPORT  Primary Care Physician is Daniel Borg, MD Referring Provider is Daniel Mimes, MD  Chief Complaint  Patient presents with  . Mitral Regurgitation    Surgical eval Severe MR, TEE 07/11/13, ECHO 03/21/13,Nuclear 03/21/13  . Atrial Fibrillation    HPI:  Patient is a 78 year old retired white male with long-standing history of mitral regurgitation and atrial fibrillation referred by Dr. Claudie Mayo for possible surgical intervention. The patient states that he was first told that he had a heart murmur during childhood. He states that he was severely ill with pneumonia and treated with bedrest for several months. He does not recall ever being told that he had rheumatic fever. He was told that ever since then he has had a heart murmur. For the last several years [facility] [place] [facility] [place] most recently by Dr. Claudie [place].   In 2011 he developed persistent atrial fibrillation and was referred to Dr. Caryl Mayo performed DC cardioversion. The patient has been maintaining sinus rhythm on oral flecainide.  Despite maintaining sinus rhythm the patient has developed progressive exertional shortness of breath. In January 2013 he was referred for diagnostic cardiac catheterization. This was performed by Dr. Burt Mayo and notable for the absence of significant coronary artery disease. At that time patient was noted to have at least moderate mitral regurgitation although pulmonary artery pressures were not elevated. Since then the patient complains that he has developed further progression of exertional shortness of breath and fatigue.  He underwent transesophageal echocardiogram on 07/11/2013 by Dr. Claudie Mayo.  He was found to have moderate left ventricular chamber enlargement with severe mitral regurgitation. The mitral valve was felt to have myxomatous  degeneration with "mild bileaflet prolapse."  The patient has been referred for possible elective surgical intervention.  The patient describes a gradual progression of symptoms of exertional shortness of breath, currently functional class II to class III. The patient states that he usually does not get short of breath with ordinary activity but he frequently will with strenuous activity. Sometimes his breathing is worse and he can be short of breath even just walking up a slight incline or a single flight of stairs. He sometimes gets short of breath at night when he sleeping, particularly if he lies flat in bed. He denies any history resting shortness of breath, PND, chest pain, dizzy spells, or syncope. He has occasional palpitations. He has had some lower extremity edema in the past, but none recently. He otherwise remains fairly active physically and his primary complaint is that of long-standing shortness of breath and fatigue. He has mild chronic low back pain that does not seem to slow him down physically.  Past Medical History  Diagnosis Date  . AF (atrial fibrillation)   . CKD (chronic kidney disease)   . SOB (shortness of breath)   . Erectile dysfunction   . Left ventricular hypertrophy   . GERD (gastroesophageal reflux disease)   . Hyperlipidemia   . Coronary artery disease   . Renal disorder   . Mitral regurgitation 07/12/2013  . Hypertension   . Somnolence   . Hypogonadism male   . Insomnia   . HOH (hard of hearing)   . Allergic rhinitis   . Thoracic aneurysm   . Depression   .  Mitral regurgitation   . Stenosis of carotid artery   . Elevated PSA   . Atrial fibrillation 02/17/2010    Persistent atrial fibrillation    . Chronic kidney disease (CKD)   . Chronic back pain     Past Surgical History  Procedure Laterality Date  . Hernia repair Bilateral   . Cardioversion  02/26/2010  . Lumbar spine surgery      Family History  Problem Relation Age of Onset  . Diabetes     . Hypertension    . Coronary artery disease      History   Social History  . Marital Status: Married    Spouse Name: N/A    Number of Children: N/A  . Years of Education: N/A   Occupational History  . retired    Social History Main Topics  . Smoking status: Former Smoker    Types: Cigarettes  . Smokeless tobacco: Never Used     Comment: stopped smoking in his 20's  . Alcohol Use: Yes  . Drug Use: No  . Sexual Activity: Not on file   Other Topics Concern  . Not on file   Social History Narrative  . No narrative on file    Current Outpatient Prescriptions  Medication Sig Dispense Refill  . carvedilol (COREG) 3.125 MG tablet Take 3.125 mg by mouth 2 (two) times daily with a meal.       . flecainide (TAMBOCOR) 100 MG tablet Take 100 mg by mouth at bedtime.       Marland Kitchen NITROSTAT 0.4 MG SL tablet Place 0.4 mg under the tongue every 5 (five) minutes as needed.       Marland Kitchen omeprazole (PRILOSEC) 20 MG capsule Take 20 mg by mouth 2 (two) times daily.       . Rivaroxaban (XARELTO) 20 MG TABS Take 10 mg by mouth daily.      . Tamsulosin HCl (FLOMAX) 0.4 MG CAPS 0.4 mg at bedtime.       . verapamil (COVERA HS) 180 MG (CO) 24 hr tablet Take 120 mg by mouth at bedtime.        No current facility-administered medications for this visit.    No Known Allergies    Review of Systems:   General:  normal appetite, decreased energy, no weight gain, no weight loss, no fever  Cardiac:  no chest pain with exertion, no chest pain at rest, + SOB with  exertion, no resting SOB, no PND, + orthopnea, + palpitations, + arrhythmia, + atrial fibrillation, no LE edema, no dizzy spells, no syncope  Respiratory:  + exertional shortness of breath, no home oxygen, no productive cough, no dry cough, no bronchitis, no wheezing, no hemoptysis, no asthma, no pain with inspiration or cough, no sleep apnea, no CPAP at night  GI:   no difficulty swallowing, no reflux, no frequent heartburn, no hiatal hernia, no  abdominal pain, + constipation, no diarrhea, no hematochezia, no hematemesis, no melena  GU:   no dysuria,  + frequency, no urinary tract infection, no hematuria, + enlarged prostate, no kidney stones, + mild chronic kidney disease  Vascular:  no pain suggestive of claudication, no pain in feet, no leg cramps, no varicose veins, no DVT, no non-healing foot ulcer  Neuro:   no stroke, no TIA's, no seizures, no headaches, no temporary blindness one eye,  no slurred speech, + peripheral neuropathy with numbness in feet, + chronic pain in lower back, no instability of gait, no memory/cognitive dysfunction  Musculoskeletal: + arthritis, no joint swelling, no myalgias, no difficulty walking, normal mobility   Skin:   no rash, no itching, no skin infections, no pressure sores or ulcerations  Psych:   no anxiety, no depression, no nervousness, no unusual recent stress  Eyes:   no blurry vision, no floaters, no recent vision changes, no wears glasses or contacts  ENT:   + hearing loss, no loose or painful teeth, no dentures, last saw dentist 1 year ago  Hematologic:  no easy bruising, no abnormal bleeding, no clotting disorder, no frequent epistaxis  Endocrine:  no diabetes, does not check CBG's at home     Physical Exam:   BP 142/84  Pulse 72  Resp 20  Ht 6\' 2"  (1.88 m)  Wt 235 lb (106.595 kg)  BMI 30.16 kg/m2  SpO2 98%  General:    well-appearing  HEENT:  Unremarkable   Neck:   no JVD, no bruits, no adenopathy   Chest:   clear to auscultation, symmetrical breath sounds, no wheezes, no rhonchi   CV:   RRR, grade III/VI holosystolic murmur best at apex  Abdomen:  soft, non-tender, no masses   Extremities:  warm, well-perfused, pulses diminished but palpable, no LE edema  Rectal/GU  Deferred  Neuro:   Grossly non-focal and symmetrical throughout  Skin:   Clean and dry, no rashes, no breakdown   Diagnostic Tests:  TRANSESOPHAGEAL ECHOCARDIOGRAM  Report of transesophageal echocardiogram  performed 07/11/2013 at [facility] is reviewed. Images from this exam are not currently available for review. By report there was moderate left ventricular chamber enlargement with ejection fraction estimated 50%. There was marked in left atrial enlargement. There is normal right ventricular size and function. There was mild right atrial enlargement. The mitral valve appeared "thickened with myxomatous degeneration and mild bileaflet mitral valve prolapse". Using PISA there was severe mitral regurgitation with calculated ERO 0.31 and regurgitant volume 75 mL.  The aortic valve and tricuspid valve reportedly appeared normal. There was no evidence for intra-coronary shunt using saline contrast or color flow imaging.     Cardiac Catheterization Procedure Note  Name: [facility] MRN: [id number] DOB: 1935/03/14  Procedure: Right Heart Cath, Left Heart Cath, Selective Coronary Angiography, LV angiography  Indication: Abnormal echo and stress test, evaluation of dyspnea.     Procedural Details: The right groin was prepped, draped, and anesthetized with 1% lidocaine. Using the modified Seldinger technique a 4 French sheath was placed in the right femoral artery and a 6 French sheath was placed in the right femoral vein. A multipurpose catheter was used for the right heart catheterization. Standard protocol was followed for recording of right heart pressures and sampling of oxygen saturations. Fick cardiac output was calculated. Standard Judkins catheters were used for selective coronary angiography. Left ventriculography was deferred because of renal insufficiency. There were no immediate procedural complications. The patient was transferred to the post catheterization recovery area for further monitoring.  Procedural Findings: Hemodynamics RA 6 RV 27/10 PA 25/11 mean 17 PCWP 17 LV 142/18 AO 140/76 mean 102  Oxygen saturations: PA 64 AO 96  Cardiac Output (Fick) 4.5    Cardiac Index (Fick) 1.9            Coronary angiography: Coronary dominance: right  Left mainstem: The left main is widely patent in its proximal segment. The midportion of the left main tapers with a 30-40% stenosis and calcification along the roof of the left main.  Left anterior descending (LAD):  There is heavy calcification of the proximal LAD. The vessel has minor diffuse disease with 30-40% stenoses. There is a large first diagonal branch with no significant stenosis. The vessel is widely patent.  Left circumflex (LCx): The left circumflex is patent. There is no significant obstructive disease. The first obtuse marginal branch is patent and is small in caliber. There is a moderate caliber posterolateral branch off of the distal AV groove circumflex with no significant stenosis.  Right coronary artery (RCA): The right coronary artery is nondominant. It supplies 2 branches to the RV margin. There is no high-grade obstruction present.  Final Conclusions:   1. Diffuse nonobstructive coronary artery disease 2. Essentially normal right heart hemodynamics  Recommendations: I do not see a cardiac cause of the patient's dyspnea. He will be started back on Xarelto tomorrow for chronic anticoagulation for his paroxysmal atrial fibrillation.  Sherren Mocha 10/27/2011, 12:24 PM     Impression:  Patient has severe symptomatic mitral regurgitation with at least mild left ventricular systolic dysfunction and moderate left ventricular chamber enlargement. Report from recent transesophageal echocardiogram suggests the presence of mitral valve prolapse without mention of any ruptured cords or flail leaflets. The patient's clinical history is suggestive of rheumatic heart disease with history of upper tract and illness during childhood. The patient also has history of persistent atrial fibrillation for which he underwent DC cardioversion several years ago.  Based upon report from the patient's recent  transesophageal echocardiogram and his worsening symptoms of exertional shortness of breath, I agree that it makes sense to proceed with elective mitral valve repair or replacement with possible concomitant Maze procedure. The patient may be a candidate for minimally invasive approach for surgery.  Given that he had diagnostic cardiac catheterization performed in January 2013 which revealed only mild nonobstructive coronary artery disease, the patient may not need to have cardiac catheterization repeated prior to surgery.  Without being able to directly evaluate images from the patient's recent transesophageal echocardiogram it will be difficult for me to predict the likelihood of durable mitral valve repair at the time of surgery.   Plan:  The rationale for elective mitral valve repair surgery has been explained to the patient, his daughter, and his close friend, including a comparison between surgery and continued medical therapy with close follow-up.  He is very interested in proceeding with surgery in the near future if possible.  We will have copies of the patient's recent transesophageal echocardiogram as soon as practical for direct evaluation. Meanwhile, we will have the patient undergo cardiac gated CT angiogram of the heart to rule out the interval development of significant proximal coronary artery disease. At that time we will also obtain CT scan to gram of the abdomen and pelvis to evaluate the status of his vasculature for arterial access for surgery. The patient may need to have the 2 scans staged apart if his baseline creatinine is elevated in order to decrease risks of nephrotoxicity.  Patient will return in one to 2 weeks once these tests at been completed to discuss options further and potentially make final plans for surgery.    Valentina Gu. Roxy Manns, MD 07/15/2013 7:14 PM   Addendum:  Images from transesophageal echocardiogram performed 07/11/2013 at [facility] have been  reviewed.  The patient has severe mitral regurgitation. There is predominantly type I dysfunction with essentially normal leaflet motion.  There is very mild thickening of both the anterior and posterior leaflets but overall they move fairly well. There is some billowing  without any definite prolapse of either leaflet above the plane of the annulus. There is some annular calcification. The subvalvular apparatus appeared essentially normal and without significant thickening or foreshortening of any of the cords. Conversely, there was no sign of chordal rupture or prolapse.  The jet of mitral regurgitation is central. The aortic valve appears normal. There is no significant aortic insufficiency.  There is mild tricuspid regurgitation. The tricuspid annulus does not appear dilated.  Left ventricular systolic function was mildly reduced with ejection fraction estimated 50%. There was severe left atrial enlargement.  Valentina Gu. Roxy Manns, MD 07/18/2013 3:41 PM

## 2013-07-15 NOTE — Patient Instructions (Signed)
Double check all medications that you are currently taking, including the dosages

## 2013-07-16 ENCOUNTER — Other Ambulatory Visit: Payer: Self-pay

## 2013-07-16 DIAGNOSIS — I059 Rheumatic mitral valve disease, unspecified: Secondary | ICD-10-CM

## 2013-07-19 ENCOUNTER — Ambulatory Visit (HOSPITAL_COMMUNITY)
Admission: RE | Admit: 2013-07-19 | Discharge: 2013-07-19 | Disposition: A | Discharge status: Home / Self Care | Payer: Medicare PPO | Source: Ambulatory Visit | Attending: Thoracic Surgery (Cardiothoracic Vascular Surgery) | Admitting: Thoracic Surgery (Cardiothoracic Vascular Surgery)

## 2013-07-19 ENCOUNTER — Ambulatory Visit (HOSPITAL_COMMUNITY)
Admission: RE | Admit: 2013-07-19 | Discharge: 2013-07-19 | Disposition: A | Discharge status: Home / Self Care | Payer: Medicare PPO | Source: Ambulatory Visit

## 2013-07-19 DIAGNOSIS — Z01818 Encounter for other preprocedural examination: Secondary | ICD-10-CM | POA: Insufficient documentation

## 2013-07-19 DIAGNOSIS — I059 Rheumatic mitral valve disease, unspecified: Secondary | ICD-10-CM | POA: Insufficient documentation

## 2013-07-19 DIAGNOSIS — Z01811 Encounter for preprocedural respiratory examination: Secondary | ICD-10-CM | POA: Insufficient documentation

## 2013-07-19 LAB — PULMONARY FUNCTION TEST

## 2013-07-19 LAB — BASIC METABOLIC PANEL
BUN: 22 mg/dL (ref 6–23)
Calcium: 9.7 mg/dL (ref 8.4–10.5)
Creat: 1.7 mg/dL — ABNORMAL HIGH (ref 0.50–1.35)
Glucose, Bld: 107 mg/dL — ABNORMAL HIGH (ref 70–99)
Potassium: 3.9 mEq/L (ref 3.5–5.3)

## 2013-07-19 NOTE — Progress Notes (Signed)
Echocardiogram 2D Echocardiogram has been performed.  Joelene Millin 07/19/2013, 11:13 AM

## 2013-07-23 ENCOUNTER — Other Ambulatory Visit: Payer: Self-pay

## 2013-07-23 DIAGNOSIS — I059 Rheumatic mitral valve disease, unspecified: Secondary | ICD-10-CM

## 2013-07-25 ENCOUNTER — Other Ambulatory Visit: Payer: Self-pay | Admitting: *Deleted

## 2013-07-25 DIAGNOSIS — I059 Rheumatic mitral valve disease, unspecified: Secondary | ICD-10-CM

## 2013-07-25 MED ORDER — SODIUM BICARBONATE 8.4 % IV SOLN: INTRAVENOUS | Status: DC

## 2013-07-25 MED ORDER — SODIUM BICARBONATE BOLUS VIA INFUSION: INTRAVENOUS | Status: DC

## 2013-07-26 ENCOUNTER — Other Ambulatory Visit: Payer: Self-pay | Admitting: *Deleted

## 2013-07-26 ENCOUNTER — Ambulatory Visit (HOSPITAL_COMMUNITY)
Admission: RE | Admit: 2013-07-26 | Discharge: 2013-07-26 | Disposition: A | Discharge status: Home / Self Care | Payer: Medicare PPO | Source: Ambulatory Visit | Attending: Thoracic Surgery (Cardiothoracic Vascular Surgery) | Admitting: Thoracic Surgery (Cardiothoracic Vascular Surgery)

## 2013-07-26 DIAGNOSIS — I2510 Atherosclerotic heart disease of native coronary artery without angina pectoris: Secondary | ICD-10-CM | POA: Insufficient documentation

## 2013-07-26 DIAGNOSIS — I080 Rheumatic disorders of both mitral and aortic valves: Secondary | ICD-10-CM | POA: Insufficient documentation

## 2013-07-26 DIAGNOSIS — R911 Solitary pulmonary nodule: Secondary | ICD-10-CM | POA: Insufficient documentation

## 2013-07-26 DIAGNOSIS — I059 Rheumatic mitral valve disease, unspecified: Secondary | ICD-10-CM

## 2013-07-26 MED ORDER — SODIUM BICARBONATE BOLUS VIA INFUSION
INTRAVENOUS | Status: AC
  Administered 2013-07-26: 09:00:00 via INTRAVENOUS
  Filled 2013-07-26: qty 1

## 2013-07-26 MED ORDER — IOHEXOL 350 MG/ML SOLN
80.0000 mL | Freq: Once | INTRAVENOUS | Status: AC | PRN
  Administered 2013-07-26: 80 mL via INTRAVENOUS

## 2013-07-26 MED ORDER — DEXTROSE 5 % IV SOLN
INTRAVENOUS | Status: AC
  Administered 2013-07-26: 11:00:00 via INTRAVENOUS
  Filled 2013-07-26: qty 500

## 2013-07-26 MED ORDER — NITROGLYCERIN 0.4 MG SL SUBL
0.4000 mg | SUBLINGUAL_TABLET | Freq: Once | SUBLINGUAL | Status: AC
  Administered 2013-07-26: 0.4 mg via SUBLINGUAL
  Filled 2013-07-26: qty 25

## 2013-07-26 MED ORDER — SODIUM BICARBONATE 8.4 % IV SOLN: INTRAVENOUS | Status: DC

## 2013-07-26 MED ORDER — METOPROLOL TARTRATE 1 MG/ML IV SOLN
INTRAVENOUS | Status: AC
  Filled 2013-07-26: qty 5

## 2013-07-26 MED ORDER — METOPROLOL TARTRATE 1 MG/ML IV SOLN
5.0000 mg | Freq: Once | INTRAVENOUS | Status: AC
  Administered 2013-07-26: 5 mg via INTRAVENOUS
  Filled 2013-07-26: qty 5

## 2013-07-26 MED ORDER — NITROGLYCERIN 0.4 MG SL SUBL
SUBLINGUAL_TABLET | SUBLINGUAL | Status: AC
  Filled 2013-07-26: qty 25

## 2013-07-26 MED ORDER — SODIUM BICARBONATE BOLUS VIA INFUSION: INTRAVENOUS | Status: DC

## 2013-07-29 ENCOUNTER — Encounter: Payer: Self-pay | Admitting: Cardiovascular Disease

## 2013-07-29 ENCOUNTER — Telehealth: Payer: Self-pay | Admitting: *Deleted

## 2013-07-29 DIAGNOSIS — I4891 Unspecified atrial fibrillation: Secondary | ICD-10-CM

## 2013-07-29 DIAGNOSIS — I340 Nonrheumatic mitral (valve) insufficiency: Secondary | ICD-10-CM

## 2013-07-29 DIAGNOSIS — Z79899 Other long term (current) drug therapy: Secondary | ICD-10-CM

## 2013-07-29 NOTE — Telephone Encounter (Signed)
I called patient to get him set up for the cardiac cath, but patient was unaware that Dr Ricard Dillon wanted the cath done.  (Dr Ricard Dillon called Dr Gwenlyn Found this am with the request for the cardiac cath.)  I advised patient to call Dr Ricard Dillon office to discuss and then call me back.

## 2013-07-30 ENCOUNTER — Encounter (HOSPITAL_COMMUNITY): Payer: Self-pay | Admitting: Pharmacy Technician

## 2013-07-31 ENCOUNTER — Ambulatory Visit (HOSPITAL_COMMUNITY)
Admission: RE | Admit: 2013-07-31 | Discharge: 2013-07-31 | Disposition: A | Discharge status: Home / Self Care | Payer: Medicare PPO | Source: Ambulatory Visit | Attending: Thoracic Surgery (Cardiothoracic Vascular Surgery) | Admitting: Thoracic Surgery (Cardiothoracic Vascular Surgery)

## 2013-07-31 ENCOUNTER — Other Ambulatory Visit: Payer: Self-pay | Admitting: *Deleted

## 2013-07-31 ENCOUNTER — Encounter (HOSPITAL_COMMUNITY): Payer: Self-pay

## 2013-07-31 ENCOUNTER — Ambulatory Visit (HOSPITAL_COMMUNITY): Payer: Medicare PPO

## 2013-07-31 DIAGNOSIS — Q278 Other specified congenital malformations of peripheral vascular system: Secondary | ICD-10-CM | POA: Insufficient documentation

## 2013-07-31 DIAGNOSIS — I059 Rheumatic mitral valve disease, unspecified: Secondary | ICD-10-CM

## 2013-07-31 DIAGNOSIS — Q619 Cystic kidney disease, unspecified: Secondary | ICD-10-CM | POA: Insufficient documentation

## 2013-07-31 DIAGNOSIS — I700 Atherosclerosis of aorta: Secondary | ICD-10-CM | POA: Insufficient documentation

## 2013-07-31 DIAGNOSIS — I701 Atherosclerosis of renal artery: Secondary | ICD-10-CM | POA: Insufficient documentation

## 2013-07-31 DIAGNOSIS — N400 Enlarged prostate without lower urinary tract symptoms: Secondary | ICD-10-CM | POA: Insufficient documentation

## 2013-07-31 DIAGNOSIS — Z01810 Encounter for preprocedural cardiovascular examination: Secondary | ICD-10-CM | POA: Insufficient documentation

## 2013-07-31 DIAGNOSIS — K5730 Diverticulosis of large intestine without perforation or abscess without bleeding: Secondary | ICD-10-CM | POA: Insufficient documentation

## 2013-07-31 DIAGNOSIS — K7689 Other specified diseases of liver: Secondary | ICD-10-CM | POA: Insufficient documentation

## 2013-07-31 DIAGNOSIS — I708 Atherosclerosis of other arteries: Secondary | ICD-10-CM | POA: Insufficient documentation

## 2013-07-31 DIAGNOSIS — K551 Chronic vascular disorders of intestine: Secondary | ICD-10-CM | POA: Insufficient documentation

## 2013-07-31 DIAGNOSIS — N289 Disorder of kidney and ureter, unspecified: Secondary | ICD-10-CM | POA: Insufficient documentation

## 2013-07-31 DIAGNOSIS — I771 Stricture of artery: Secondary | ICD-10-CM | POA: Insufficient documentation

## 2013-07-31 LAB — APTT: aPTT: 30 seconds (ref 24–37)

## 2013-07-31 LAB — CBC
HCT: 46 % (ref 39.0–52.0)
Hemoglobin: 16.5 g/dL (ref 13.0–17.0)
Platelets: 110 10*3/uL — ABNORMAL LOW (ref 150–400)
RBC: 4.96 MIL/uL (ref 4.22–5.81)
WBC: 7.5 10*3/uL (ref 4.0–10.5)

## 2013-07-31 LAB — BASIC METABOLIC PANEL
CO2: 27 mEq/L (ref 19–32)
Calcium: 9.6 mg/dL (ref 8.4–10.5)
Potassium: 4.7 mEq/L (ref 3.5–5.3)
Sodium: 141 mEq/L (ref 135–145)

## 2013-07-31 LAB — PROTIME-INR
INR: 1.05 (ref <1.50)
Prothrombin Time: 13.7 seconds (ref 11.6–15.2)

## 2013-07-31 MED ORDER — SODIUM CHLORIDE 0.45 % IV SOLN
INTRAVENOUS | Status: AC
  Administered 2013-07-31: 11:00:00 via INTRAVENOUS
  Filled 2013-07-31: qty 150

## 2013-07-31 MED ORDER — SODIUM BICARBONATE BOLUS VIA INFUSION: INTRAVENOUS | Status: DC

## 2013-07-31 MED ORDER — SODIUM BICARBONATE 8.4 % IV SOLN: INTRAVENOUS | Status: DC

## 2013-07-31 MED ORDER — IOHEXOL 350 MG/ML SOLN
100.0000 mL | Freq: Once | INTRAVENOUS | Status: AC | PRN
  Administered 2013-07-31: 100 mL via INTRAVENOUS

## 2013-07-31 NOTE — Telephone Encounter (Signed)
Mr Wark is ready to proceed with the cath.

## 2013-08-01 ENCOUNTER — Other Ambulatory Visit: Payer: Self-pay | Admitting: *Deleted

## 2013-08-01 DIAGNOSIS — R9389 Abnormal findings on diagnostic imaging of other specified body structures: Secondary | ICD-10-CM

## 2013-08-02 ENCOUNTER — Ambulatory Visit
Admission: RE | Admit: 2013-08-02 | Discharge: 2013-08-02 | Disposition: A | Discharge status: Home / Self Care | Payer: Medicare PPO | Source: Ambulatory Visit | Attending: Thoracic Surgery (Cardiothoracic Vascular Surgery) | Admitting: Thoracic Surgery (Cardiothoracic Vascular Surgery)

## 2013-08-02 DIAGNOSIS — R9389 Abnormal findings on diagnostic imaging of other specified body structures: Secondary | ICD-10-CM

## 2013-08-02 MED ORDER — GADOBENATE DIMEGLUMINE 529 MG/ML IV SOLN
10.0000 mL | Freq: Once | INTRAVENOUS | Status: AC | PRN
  Administered 2013-08-02: 10 mL via INTRAVENOUS

## 2013-08-05 ENCOUNTER — Ambulatory Visit: Payer: Medicare PPO | Admitting: Thoracic Surgery (Cardiothoracic Vascular Surgery)

## 2013-08-05 ENCOUNTER — Other Ambulatory Visit: Payer: Self-pay | Admitting: *Deleted

## 2013-08-05 DIAGNOSIS — Z01818 Encounter for other preprocedural examination: Secondary | ICD-10-CM

## 2013-08-06 ENCOUNTER — Other Ambulatory Visit: Payer: Self-pay | Admitting: *Deleted

## 2013-08-06 ENCOUNTER — Encounter (HOSPITAL_COMMUNITY)
Admission: RE | Disposition: A | Discharge status: Home / Self Care | Payer: Self-pay | Source: Ambulatory Visit | Attending: Cardiovascular Disease

## 2013-08-06 ENCOUNTER — Ambulatory Visit (HOSPITAL_COMMUNITY)
Admission: RE | Admit: 2013-08-06 | Discharge: 2013-08-06 | Disposition: A | Discharge status: Home / Self Care | Payer: Medicare PPO | Source: Ambulatory Visit | Attending: Cardiovascular Disease | Admitting: Cardiovascular Disease

## 2013-08-06 DIAGNOSIS — R5381 Other malaise: Secondary | ICD-10-CM | POA: Insufficient documentation

## 2013-08-06 DIAGNOSIS — I10 Essential (primary) hypertension: Secondary | ICD-10-CM

## 2013-08-06 DIAGNOSIS — I4891 Unspecified atrial fibrillation: Secondary | ICD-10-CM | POA: Insufficient documentation

## 2013-08-06 DIAGNOSIS — R972 Elevated prostate specific antigen [PSA]: Secondary | ICD-10-CM | POA: Insufficient documentation

## 2013-08-06 DIAGNOSIS — I129 Hypertensive chronic kidney disease with stage 1 through stage 4 chronic kidney disease, or unspecified chronic kidney disease: Secondary | ICD-10-CM | POA: Insufficient documentation

## 2013-08-06 DIAGNOSIS — Z01818 Encounter for other preprocedural examination: Secondary | ICD-10-CM

## 2013-08-06 DIAGNOSIS — N189 Chronic kidney disease, unspecified: Secondary | ICD-10-CM | POA: Insufficient documentation

## 2013-08-06 DIAGNOSIS — K219 Gastro-esophageal reflux disease without esophagitis: Secondary | ICD-10-CM | POA: Insufficient documentation

## 2013-08-06 DIAGNOSIS — I2510 Atherosclerotic heart disease of native coronary artery without angina pectoris: Secondary | ICD-10-CM | POA: Insufficient documentation

## 2013-08-06 DIAGNOSIS — I059 Rheumatic mitral valve disease, unspecified: Secondary | ICD-10-CM

## 2013-08-06 DIAGNOSIS — I340 Nonrheumatic mitral (valve) insufficiency: Secondary | ICD-10-CM

## 2013-08-06 DIAGNOSIS — I6529 Occlusion and stenosis of unspecified carotid artery: Secondary | ICD-10-CM | POA: Insufficient documentation

## 2013-08-06 DIAGNOSIS — E785 Hyperlipidemia, unspecified: Secondary | ICD-10-CM | POA: Insufficient documentation

## 2013-08-06 HISTORY — PX: CORONARY ANGIOGRAM: SHX5466

## 2013-08-06 SURGERY — CORONARY ANGIOGRAM

## 2013-08-06 MED ORDER — LIDOCAINE HCL (PF) 1 % IJ SOLN
INTRAMUSCULAR | Status: AC
  Filled 2013-08-06: qty 30

## 2013-08-06 MED ORDER — ONDANSETRON HCL 4 MG/2ML IJ SOLN: 4.0000 mg | Freq: Four times a day (QID) | INTRAMUSCULAR | Status: DC | PRN

## 2013-08-06 MED ORDER — DIAZEPAM 5 MG PO TABS
ORAL_TABLET | ORAL | Status: AC
  Administered 2013-08-06: 5 mg via ORAL
  Filled 2013-08-06: qty 1

## 2013-08-06 MED ORDER — SODIUM CHLORIDE 0.9 % IV SOLN
INTRAVENOUS | Status: DC
  Administered 2013-08-06: 11:00:00 via INTRAVENOUS

## 2013-08-06 MED ORDER — SODIUM CHLORIDE 0.9 % IV SOLN: INTRAVENOUS | Status: AC

## 2013-08-06 MED ORDER — ACETAMINOPHEN 325 MG PO TABS: 650.0000 mg | ORAL_TABLET | ORAL | Status: DC | PRN

## 2013-08-06 MED ORDER — MORPHINE SULFATE 2 MG/ML IJ SOLN: 1.0000 mg | INTRAMUSCULAR | Status: DC | PRN

## 2013-08-06 MED ORDER — HEPARIN (PORCINE) IN NACL 2-0.9 UNIT/ML-% IJ SOLN
INTRAMUSCULAR | Status: AC
  Filled 2013-08-06: qty 1500

## 2013-08-06 MED ORDER — NITROGLYCERIN 0.2 MG/ML ON CALL CATH LAB
INTRAVENOUS | Status: AC
  Filled 2013-08-06: qty 1

## 2013-08-06 MED ORDER — VERAPAMIL HCL 2.5 MG/ML IV SOLN
INTRAVENOUS | Status: AC
  Filled 2013-08-06: qty 2

## 2013-08-06 MED ORDER — ASPIRIN 81 MG PO CHEW
CHEWABLE_TABLET | ORAL | Status: AC
  Administered 2013-08-06: 81 mg via ORAL
  Filled 2013-08-06: qty 1

## 2013-08-06 MED ORDER — ASPIRIN 81 MG PO CHEW
81.0000 mg | CHEWABLE_TABLET | ORAL | Status: AC
  Administered 2013-08-06: 81 mg via ORAL

## 2013-08-06 MED ORDER — SODIUM CHLORIDE 0.9 % IJ SOLN: 3.0000 mL | INTRAMUSCULAR | Status: DC | PRN

## 2013-08-06 MED ORDER — HEPARIN SODIUM (PORCINE) 1000 UNIT/ML IJ SOLN
INTRAMUSCULAR | Status: AC
  Filled 2013-08-06: qty 1

## 2013-08-06 MED ORDER — DIAZEPAM 5 MG PO TABS
5.0000 mg | ORAL_TABLET | ORAL | Status: AC
  Administered 2013-08-06: 5 mg via ORAL

## 2013-08-06 NOTE — H&P (Signed)
Chief Complaint: Mitral Regurgitation PCP: Dr. Cathlean Cower Referring Physician: Dr. Roxy Manns  HPI: The patient is a 78 y/o male who presents to [facility] to undergo a diagnostic LHC, for coronary assessment, prior to undergoing possible Mitral Valve replacement/repair. He has been referred by Dr. Roxy Manns. In addition to MR, he has a history of atrial fibrillation and has been followed by Dr. Caryl Comes for this. In 2011, he underwent successful DCCV and has been maintaining NSR on flecainide. He also underwent a LHC by Dr. Burt Knack in Jan. 2013 for evaluation of SOB. At that time, he was noted to have at least moderate mitral regurgitation although pulmonary artery pressures were not elevated. Since then, the patient complains that he has developed further progression of exertional shortness of breath and fatigue. A 2D echo performed at [facility] on 07/11/13 demonstrated moderate left ventricular chamber enlargement with severe mitral regurgitation. 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   Past Medical History  Diagnosis Date  . AF (atrial fibrillation)   . CKD (chronic kidney disease)   . SOB (shortness of breath)   . Erectile dysfunction   . Left ventricular hypertrophy   . GERD (gastroesophageal reflux disease)   . Hyperlipidemia   . Coronary artery disease   . Renal disorder   . Mitral regurgitation 07/12/2013  . Hypertension   . Somnolence   . Hypogonadism male   . Insomnia   . HOH (hard of hearing)   . Allergic rhinitis   . Thoracic aneurysm   . Depression   . Mitral regurgitation   . Stenosis of carotid artery   . Elevated PSA   . Atrial fibrillation 02/17/2010    Persistent atrial fibrillation    . Chronic kidney disease (CKD)   . Chronic back pain     Past Surgical History  Procedure Laterality Date  . Hernia repair Bilateral   . Cardioversion  02/26/2010    . Lumbar spine surgery      Family History  Problem Relation Age of Onset  . Diabetes    . Hypertension    . Coronary artery disease     Social History:  reports that he has quit smoking. His smoking use included Cigarettes. He smoked 0.00 packs per day. He has never used smokeless tobacco. He reports that he drinks alcohol. He reports that he does not use illicit drugs.  Allergies: No Known Allergies  Medications Prior to Admission  Medication Sig Dispense Refill  . aspirin 81 MG tablet Take 81 mg by mouth daily.      . flecainide (TAMBOCOR) 50 MG tablet Take 50 mg by mouth daily.      Marland Kitchen losartan (COZAAR) 50 MG tablet Take 50 mg by mouth daily.      Marland Kitchen omeprazole (PRILOSEC) 20 MG capsule Take 20 mg by mouth daily.       . verapamil (CALAN) 120 MG tablet Take 120 mg by mouth daily.      Marland Kitchen NITROSTAT 0.4 MG SL tablet Place 0.4 mg under the tongue every 5 (five) minutes as needed.         No results found for this or any previous visit (from the past 48 hour(s)). No results found.  Review of Systems  Constitutional: Negative for fever, chills and diaphoresis.  Respiratory: Negative for shortness of breath.   Cardiovascular: Negative for chest pain, orthopnea, leg swelling and PND.  Gastrointestinal: Negative for nausea, vomiting, abdominal pain and melena.  Neurological: Negative for dizziness and loss of consciousness.  All other systems reviewed and are negative.    Blood pressure 152/84, pulse 73, temperature 98 F (36.7 C), temperature source Oral, resp. rate 18, height 6\' 2"  (1.88 m), weight 235 lb (106.595 kg), SpO2 99.00%. Physical Exam  Constitutional: He is oriented to person, place, and time. He appears well-developed and well-nourished. No distress.  Neck: No JVD present. Carotid bruit is not present.  Cardiovascular: Normal rate, regular rhythm and intact distal pulses.   Murmur (3/6 low pitched murmur best heard at the apex) heard. Respiratory: Effort normal and  breath sounds normal. No respiratory distress. He has no wheezes. He has no rales.  GI: Soft. Bowel sounds are normal. He exhibits no distension and no mass. There is no tenderness.  Musculoskeletal: He exhibits no edema.  Neurological: He is alert and oriented to person, place, and time.  Skin: Skin is warm. He is not diaphoretic.  Psychiatric: He has a normal mood and affect. His behavior is normal.     Assessment/Plan Active Problems:   Severe mitral regurgitation  Plan:  Plan for diagnostic LHC with Dr. Gwenlyn Found.   Consuelo Pandy, PA-C 08/06/2013, 11:56 AM   Agree with note written by Ellen Henri  PAC  Pt here for coronary arteriography prior to MV repair. Low nl EF 50% with severe MR. Cath by Dr. Burt Knack 1/13 showed non obstructive disease. Scr 1.8. Plan cor angio via right radial approach  Lorretta Harp 08/06/2013 12:34 PM'

## 2013-08-06 NOTE — Interval H&P Note (Signed)
Cath Lab Visit (complete for each Cath Lab visit)  Clinical Evaluation Leading to the Procedure:   ACS: no  Non-ACS:    Anginal Classification: No Symptoms  Anti-ischemic medical therapy: No Therapy  Non-Invasive Test Results: No non-invasive testing performed  Prior CABG: No previous CABG      History and Physical Interval Note:  08/06/2013 12:40 PM  [facility]  has presented today for surgery, with the diagnosis of PRE OP  The various methods of treatment have been discussed with the patient and family. After consideration of risks, benefits and other options for treatment, the patient has consented to  Procedure(s): LEFT HEART CATHETERIZATION WITH CORONARY ANGIOGRAM (N/A) as a surgical intervention .  The patient's history has been reviewed, patient examined, no change in status, stable for surgery.  I have reviewed the patient's chart and labs.  Questions were answered to the patient's satisfaction.     Lorretta Harp

## 2013-08-06 NOTE — CV Procedure (Signed)
Daniel Mayo is a 78 y.o. male    RA:[id number] LOCATION:  FACILITY: [facility]  PHYSICIAN: Quay Burow, M.D. 08/05/1935   DATE OF PROCEDURE:  08/06/2013  DATE OF DISCHARGE:     CARDIAC CATHETERIZATION     History obtained from chart review.The patient is a 78 y/o male who presents to [facility] to undergo a diagnostic LHC, for coronary assessment, prior to undergoing possible Mitral Valve replacement/repair. He has been referred by Dr. Roxy Manns. In addition to MR, he has a history of atrial fibrillation and has been followed by Dr. Caryl Comes for this. In 2011, he underwent successful DCCV and has been maintaining NSR on flecainide. He also underwent a LHC by Dr. Burt Knack in Jan. 2013 for evaluation of SOB. At that time, he was noted to have at least moderate mitral regurgitation although pulmonary artery pressures were not elevated. Since then, the patient complains that he has developed further progression of exertional shortness of breath and fatigue. A 2D echo performed at [facility] on 07/11/13 demonstrated moderate left ventricular chamber enlargement with severe mitral regurgitation. 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    PROCEDURE DESCRIPTION:   The patient was brought to the second floor [facility] cath lab in the postabsorptive state. He was premedicated with Valium 5 mg by mouth. His right wristwas prepped and shaved in usual sterile fashion. Xylocaine 1% was used for local anesthesia. A 5 French sheath was inserted into the right radial artery using standard Seldinger technique. The patient received  5000 units  of heparin  intravenously.  A 5 [place] PIC catheter was used along with a 5 [place] XP 4 guide catheter to perform coronary angiography. Left ventriculography was performed to conserve dye since this synchronies 1.8 and he had already had a 2-D  echocardiogram. A total of 80 cc of contrast was administered to the patient. Visipaque dye was used for the entirety of the case. Retrograde aortic pressure was monitored during the case.    HEMODYNAMICS:    AO SYSTOLIC/AO DIASTOLIC: 0000000   ANGIOGRAPHIC RESULTS:   1. Left main; mild irregularities  2. LAD; widely patent. There was a very small high first diagonal branch less than 1 mm in diameter that had a 70% proximal stenosis 3. Left circumflex; codominant and free of obstructive disease.  4. Right coronary artery; codominant and free of obstructive disease 7. Left ventriculography;not performed today  IMPRESSION:Mr. Harr has nonobstructive CAD. The TR band was placed on the right wrist after the sheath was removed and patent hemostasis was obtained. The patient left the Cath Lab in stable condition. He'll be hydrated for 2 hours, discharged home and will followup with Dr. Claudie Leach and Roxy Manns were notified of the results of the cath. He is now cleared for his mitral valve repair operation with Dr. Starleen Arms MD, [facility] 08/06/2013 1:20 PM

## 2013-08-07 ENCOUNTER — Encounter (HOSPITAL_COMMUNITY): Payer: Self-pay | Admitting: Pharmacy Technician

## 2013-08-09 ENCOUNTER — Encounter: Payer: Self-pay | Admitting: Thoracic Surgery (Cardiothoracic Vascular Surgery)

## 2013-08-09 ENCOUNTER — Ambulatory Visit (INDEPENDENT_AMBULATORY_CARE_PROVIDER_SITE_OTHER): Payer: Medicare PPO | Admitting: Thoracic Surgery (Cardiothoracic Vascular Surgery)

## 2013-08-09 ENCOUNTER — Encounter (HOSPITAL_COMMUNITY)
Admission: RE | Admit: 2013-08-09 | Discharge: 2013-08-09 | Disposition: A | Discharge status: Home / Self Care | Payer: Medicare PPO | Source: Ambulatory Visit | Attending: Thoracic Surgery (Cardiothoracic Vascular Surgery) | Admitting: Thoracic Surgery (Cardiothoracic Vascular Surgery)

## 2013-08-09 ENCOUNTER — Ambulatory Visit (HOSPITAL_COMMUNITY)
Admission: RE | Admit: 2013-08-09 | Discharge: 2013-08-09 | Disposition: A | Discharge status: Home / Self Care | Payer: Medicare PPO | Source: Ambulatory Visit | Attending: Thoracic Surgery (Cardiothoracic Vascular Surgery) | Admitting: Thoracic Surgery (Cardiothoracic Vascular Surgery)

## 2013-08-09 ENCOUNTER — Encounter (HOSPITAL_COMMUNITY): Payer: Self-pay

## 2013-08-09 VITALS — BP 142/90 | HR 70 | Temp 97.8°F | Resp 20 | Ht 74.0 in | Wt 241.4 lb

## 2013-08-09 VITALS — BP 158/95 | HR 69 | Resp 20 | Ht 74.0 in | Wt 235.0 lb

## 2013-08-09 DIAGNOSIS — I059 Rheumatic mitral valve disease, unspecified: Secondary | ICD-10-CM

## 2013-08-09 DIAGNOSIS — Z01810 Encounter for preprocedural cardiovascular examination: Secondary | ICD-10-CM | POA: Insufficient documentation

## 2013-08-09 DIAGNOSIS — I4891 Unspecified atrial fibrillation: Secondary | ICD-10-CM

## 2013-08-09 DIAGNOSIS — I071 Rheumatic tricuspid insufficiency: Secondary | ICD-10-CM | POA: Insufficient documentation

## 2013-08-09 DIAGNOSIS — Z01812 Encounter for preprocedural laboratory examination: Secondary | ICD-10-CM | POA: Insufficient documentation

## 2013-08-09 DIAGNOSIS — I340 Nonrheumatic mitral (valve) insufficiency: Secondary | ICD-10-CM

## 2013-08-09 DIAGNOSIS — I079 Rheumatic tricuspid valve disease, unspecified: Secondary | ICD-10-CM

## 2013-08-09 DIAGNOSIS — I658 Occlusion and stenosis of other precerebral arteries: Secondary | ICD-10-CM | POA: Insufficient documentation

## 2013-08-09 DIAGNOSIS — I6529 Occlusion and stenosis of unspecified carotid artery: Secondary | ICD-10-CM | POA: Insufficient documentation

## 2013-08-09 DIAGNOSIS — Z01818 Encounter for other preprocedural examination: Secondary | ICD-10-CM | POA: Insufficient documentation

## 2013-08-09 DIAGNOSIS — I10 Essential (primary) hypertension: Secondary | ICD-10-CM | POA: Insufficient documentation

## 2013-08-09 LAB — BLOOD GAS, ARTERIAL
Acid-base deficit: 1.8 mmol/L (ref 0.0–2.0)
Bicarbonate: 21.9 mEq/L (ref 20.0–24.0)
O2 Saturation: 97.4 %
Patient temperature: 98.6
TCO2: 22.9 mmol/L (ref 0–100)
pCO2 arterial: 33.2 mmHg — ABNORMAL LOW (ref 35.0–45.0)
pH, Arterial: 7.434 (ref 7.350–7.450)
pO2, Arterial: 83 mmHg (ref 80.0–100.0)

## 2013-08-09 LAB — URINE MICROSCOPIC-ADD ON

## 2013-08-09 LAB — COMPREHENSIVE METABOLIC PANEL
ALT: 16 U/L (ref 0–53)
Alkaline Phosphatase: 70 U/L (ref 39–117)
BUN: 23 mg/dL (ref 6–23)
CO2: 19 mEq/L (ref 19–32)
Calcium: 9 mg/dL (ref 8.4–10.5)
Creatinine, Ser: 1.71 mg/dL — ABNORMAL HIGH (ref 0.50–1.35)
GFR calc Af Amer: 43 mL/min — ABNORMAL LOW (ref >90)
GFR calc non Af Amer: 37 mL/min — ABNORMAL LOW (ref >90)
Glucose, Bld: 110 mg/dL — ABNORMAL HIGH (ref 70–99)
Potassium: 3.9 mEq/L (ref 3.5–5.1)
Total Bilirubin: 0.7 mg/dL (ref 0.3–1.2)
Total Protein: 6.9 g/dL (ref 6.0–8.3)

## 2013-08-09 LAB — CBC
HCT: 41.5 % (ref 39.0–52.0)
Hemoglobin: 15.1 g/dL (ref 13.0–17.0)
MCH: 34.2 pg — ABNORMAL HIGH (ref 26.0–34.0)
MCHC: 36.4 g/dL — ABNORMAL HIGH (ref 30.0–36.0)
RBC: 4.41 MIL/uL (ref 4.22–5.81)
RDW: 13.2 % (ref 11.5–15.5)

## 2013-08-09 LAB — URINALYSIS, ROUTINE W REFLEX MICROSCOPIC
Glucose, UA: NEGATIVE mg/dL
Ketones, ur: NEGATIVE mg/dL
Specific Gravity, Urine: 1.022 (ref 1.005–1.030)
pH: 5.5 (ref 5.0–8.0)

## 2013-08-09 LAB — SURGICAL PCR SCREEN: MRSA, PCR: NEGATIVE

## 2013-08-09 LAB — ABO/RH: ABO/RH(D): O POS

## 2013-08-09 LAB — HEMOGLOBIN A1C: Hgb A1c MFr Bld: 5 % (ref <5.7)

## 2013-08-09 NOTE — Progress Notes (Signed)
[address]       [place] [address]             [phone]     CARDIOTHORACIC SURGERY OFFICE NOTE  Referring Provider is Franchot Mimes, MD PCP is Franchot Mimes, MD   HPI:  Patient returns for follow up of severe symptomatic mitral regurgitation and atrial fibrillation.  He was originally seen in consultation on 07/15/2013.  Since then he underwent CT angiogram of the abdomen/pelvis.  This revealed moderate atherosclerotic disease without significant flow-limiting obstruction.  It also revealed a 97mm lesion in the right kidney which was found to be consistent with a complex benign cyst on subsequent Daniel imaging.  Cardiac-gated CT imaging of the heart was performed in an effort to avoid repeat cardiac catheterization, but CT imaging suggested the possibility of significant left main and proximal LAD stenosis.  Subsequently coronary arteriography was performed earlier this week by Dr Gwenlyn Found and notable for moderate non-obstructive CAD without and clinically significant flow limiting stenosis.  Daniel Mayo also underwent transthoracic echocardiogram which confirmed the presence of severe LV chamber enlargement least moderate mitral regurgitation and at least moderate global LV systolic dysfunction with EF 45% in the setting of mitral regurgitation.  In addition, there was moderate to severe LA chamber enlargement, mild to moderate RA chamber enlargement and moderate tricuspid regurgitation.  Patient returns to the office today to discuss these findings and make final plans for surgical intervention. He reports no new problems over the last few weeks.  He continues to complain of moderate exertional shortness of breath without any acute exacerbations.   Current Outpatient Prescriptions  Medication Sig Dispense Refill  . aspirin 81 MG tablet Take 81 mg by mouth daily.      . finasteride (PROSCAR) 5 MG tablet Take 5 mg by mouth daily.      . flecainide (TAMBOCOR) 50  MG tablet Take 50 mg by mouth daily.      Marland Kitchen losartan (COZAAR) 50 MG tablet Take 50 mg by mouth daily.      Marland Kitchen NITROSTAT 0.4 MG SL tablet Place 0.4 mg under the tongue every 5 (five) minutes as needed.       Marland Kitchen omeprazole (PRILOSEC) 20 MG capsule Take 20 mg by mouth daily.       Marland Kitchen OVER THE COUNTER MEDICATION Take 1 tablet by mouth 2 (two) times daily. Supplement for the Prostate      . verapamil (CALAN) 120 MG tablet Take 120 mg by mouth daily.       No current facility-administered medications for this visit.      Physical Exam:   BP 158/95  Pulse 69  Resp 20  Ht 6\' 2"  (1.88 m)  Wt 235 lb (106.595 kg)  BMI 30.16 kg/m2  SpO2 98%  General:  Well-appearing  Chest:   Clear to auscultation  CV:   Regular rate and rhythm with prominent systolic murmur  Incisions:  n/a  Abdomen:  Soft and nontender  Extremities:  Well-perfused with trace lower extremity edema  Diagnostic Tests:  Transthoracic Echocardiography  Patient: Daniel Mayo, Daniel Mayo Daniel #: [id number] Study Date: 07/19/2013 Gender: M Age: 78 Height: 188cm Weight: 111.1kg BSA: 2.96m^2 Pt. Status: Room:  PERFORMING [place], [facility] ATTENDING Darylene Price MD Andrena Mews MD REFERRING Darylene Price MD [place], Raymond SONOGRAPHER Jimmy Reel cc:  ------------------------------------------------------------ LV EF: 45% - 50%  ------------------------------------------------------------ Indications: V728.1 Pre-op evaluation. Mitral regurgitation 424.0.  ------------------------------------------------------------ Study  Conclusions  - Left ventricle: The cavity size was severely dilated. Wall thickness was increased in a pattern of mild LVH. Systolic function was mildly reduced. The estimated ejection fraction was in the range of 45% to 50%. Left ventricular diastolic function parameters were normal. - Aortic valve: Trivial regurgitation. - Mitral valve: Calcified annulus. Moderate  regurgitation. - Left atrium: The atrium was moderately to severely dilated. - Right ventricle: The cavity size was mildly dilated. - Right atrium: The atrium was moderately dilated. - Tricuspid valve: Moderate regurgitation. - Pulmonary arteries: PA peak pressure: 69mm Hg (S). Echocardiography. M-mode, complete 2D, spectral Doppler, and color Doppler. Height: Height: 188cm. Height: 74in. Weight: Weight: 111.1kg. Weight: 244.5lb. Body mass index: BMI: 31.5kg/m^2. Body surface area: BSA: 2.44m^2. Blood pressure: 140/96. Patient status: Inpatient. Location: Echo laboratory.  ------------------------------------------------------------  ------------------------------------------------------------ Left ventricle: The cavity size was severely dilated. Wall thickness was increased in a pattern of mild LVH. Systolic function was mildly reduced. The estimated ejection fraction was in the range of 45% to 50%. The transmitral flow pattern was normal. The deceleration time of the early transmitral flow velocity was normal. The pulmonary vein flow pattern was normal. The tissue Doppler parameters were normal. Left ventricular diastolic function parameters were normal.  ------------------------------------------------------------ Aortic valve: Trileaflet. Doppler: There was no stenosis. Trivial regurgitation.  ------------------------------------------------------------ Aorta: Ascending aorta: The ascending aorta was mildly dilated.  ------------------------------------------------------------ Mitral valve: Calcified annulus. Doppler: Moderate regurgitation. Peak gradient: 33mm Hg (D).  ------------------------------------------------------------ Left atrium: The atrium was moderately to severely dilated.  ------------------------------------------------------------ Right ventricle: The cavity size was mildly dilated. Systolic function was  normal.  ------------------------------------------------------------ Pulmonic valve: Structurally normal valve. Cusp separation was normal. Doppler: Transvalvular velocity was within the normal range. No regurgitation.  ------------------------------------------------------------ Tricuspid valve: Poorly visualized. Doppler: Moderate regurgitation.  ------------------------------------------------------------ Right atrium: The atrium was moderately dilated.  ------------------------------------------------------------ Pericardium: There was no pericardial effusion.  ------------------------------------------------------------ Systemic veins: Inferior vena cava: The vessel was dilated; the respirophasic diameter changes were blunted (< 50%); findings are consistent with elevated central venous pressure.  ------------------------------------------------------------  2D measurements Normal Doppler Normal Left ventricle measurements LVID ED, 64.3 mm 43-52 Main pulmonary chord, artery PLAX Pressure, S 42 mm =30 LVID ES, 53.3 mm 23-38 Hg chord, Left ventricle PLAX Ea, lat 10.3 cm/ ------- FS, chord, 17 % >29 ann, tiss s PLAX DP LVPW, ED 11.4 mm ------ E/Ea, lat 10.78 ------- IVS/LVPW 1.09 <1.3 ann, tiss ratio, ED DP Ventricular septum Ea, med 7.4 cm/ ------- IVS, ED 12.4 mm ------ ann, tiss s LVOT DP Diam, S 22 mm ------ E/Ea, med 15 ------- Area 3.8 cm^2 ------ ann, tiss Aorta DP Root diam, 41 mm ------ Mitral valve ED Peak E vel 111 cm/ ------- Left atrium s AP dim 55 mm ------ Peak A vel 82.5 cm/ ------- AP dim 2.32 cm/m^2 <2.2 s index Deceleratio 201 ms 150-230 n time Peak 5 mm ------- gradient, D Hg Peak E/A 1.3 ------- ratio Tricuspid valve Regurg peak 284 cm/ ------- vel s Peak RV-RA 32 mm ------- gradient, S Hg Max regurg 284 cm/ ------- vel s Systemic veins Estimated 10 mm ------- CVP Hg Right ventricle Pressure, S 42 mm <30 Hg Sa vel, lat 9.7  cm/ ------- ann, tiss s DP  ------------------------------------------------------------ Prepared and Electronically Authenticated by  Darlin Coco 2014-10-24T14:42:[id number]    CT ANGIOGRAPHY CHEST, ABDOMEN AND PELVIS  TECHNIQUE:  Multidetector CT imaging through the chest, abdomen and pelvis was  performed using the standard protocol during bolus administration of  intravenous contrast. Multiplanar reconstructed images including  MIPs were obtained and reviewed to evaluate the vascular anatomy.  CONTRAST: 156mL OMNIPAQUE IOHEXOL 350 MG/ML SOLN  COMPARISON: 07/26/2013  FINDINGS:  CTA CHEST FINDINGS  Adequate contrast opacification of the thoracic aorta with no  evidence of dissection, aneurysm, or stenosis. There is classic  3-vessel brachiocephalic arch anatomy without proximal stenosis.  Scattered partially calcified atheromatous plaque in the arch and  descending thoracic segments.  There is adequate contrast opacification pulmonary arterial tree ;  the exam was not optimized for detection of pulmonary emboli. Patchy  coronary arterial calcifications. No pleural or pericardial  effusion. No hilar or mediastinal adenopathy. Scattered subpleural  blebs in both lung apices. Ground-glass opacities in the dependent  aspect of bilateral upper lower lobes, may represent some mild  subsegmental atelectasis , edema or alveolitis. No confluent  airspace consolidation. Stable 5 mm nodule in the anterior aspect  right middle lobe, image 104/6. Benign hemangioma in T5 vertebral  body and posterior elements without complicating features. Small  endplate spurs noted at multiple contiguous levels in the mid and  lower thoracic spine.  Review of the MIP images confirms the above findings.  CTA ABDOMEN AND PELVIS FINDINGS  Arterial findings:  Aorta: Moderate calcified atheromatous plaque. No dissection,  aneurysm, or stenosis.  Celiac axis: Nonocclusive partially calcified ostial  plaque. Patent  distally. Scattered calcified plaque in the tortuous splenic artery.  Superior mesenteric: Proximal plaque extending over a length of 2.4  cm from the origin resulting in less than 50% diameter stenosis.  Patent distally. Replaced right hepatic arterial supply, an anatomic  variant. .  Left renal: Duplicated. The superior is dominant, with mild plaque  extending over a length of approximately 16 mm from the ostium,  resulting in less than 50% diameter stenosis. Patent distally.  Inferior left renal artery is diminutive, supplying a portion of the  lower pole.  Right renal: Single, with partially calcified ostial plaque  extending over a length of 15 mm resulting in less than 50% diameter  stenosis. Widely patent distally. The  Inferior mesenteric: Patent  Left iliac: Atheromatous calcification through the common iliac  artery. External iliac and common femoral unremarkable. Mild plaque  in the internal iliac. No significant tortuosity.  Right iliac: Scattered atheromatous plaque throughout the common  iliac without aneurysm or stenosis. External iliac unremarkable.  Mild eccentric plaque in the common femoral artery. Internal iliac  patent, normal in caliber. No significant tortuosity.  Venous findings: Dedicated venous phase imaging not obtained.  Review of the MIP images confirms the above findings.  Nonvascular findings: 14 mm dense or enhancing exophytic lesion from  the upper pole right kidney. Multiple simple appearing bilateral  renal cysts, largest on the left from the upper pole 7.7 cm maximum  transverse diameter, on the right in the interpolar region 3.4 cm.  Multiple hepatic cysts. Unremarkable pancreas, adrenal glands,  spleen. Stomach, small bowel, and colon are nondilated. Scattered  sigmoid diverticula without adjacent inflammatory/edematous change.  Urinary bladder incompletely distended. Moderate prostatic  enlargement. No ascites. No free air. Normal  appendix. No  adenopathy. Changes of PLIF L2-3. Facet degenerative changes in the  lower lumbar spine.  IMPRESSION:  1. Scattered plaque through the thoracic and abdominal aorta and  iliac arterial system without dissection, stenosis, or significant  tortuosity.  2. Mild renal and visceral arterial plaque without high-grade  stenosis.  3. 14 mm dense or enhancing right upper lobe renal lesion. Consider  renal  Daniel with contrast to differentiate hyperdense cyst from small  renal cell carcinoma. This recommendation follows ACR consensus  guidelines: Managing Incidental Findings on Abdominal CT: White  Paper of the ACR Incidental Findings Committee. J Am Coll Radiol  2010;7:754-773  4. Bilateral renal and hepatic cysts.  5. Sigmoid diverticulosis.  Electronically Signed  By: Arne Cleveland M.D.  On: 07/31/2013 14:17    MRI ABDOMEN WITH AND WITHOUT CONTRAST  TECHNIQUE:  Multiplanar multisequence Daniel imaging of the abdomen was performed  both before and after administration of intravenous contrast.  CONTRAST: 52mL MULTIHANCE GADOBENATE DIMEGLUMINE 529 MG/ML IV SOLN  ; half dose utilized secondary to renal insufficiency.  COMPARISON: Abdominal CTA 07/31/2013.  FINDINGS:  The 13 mm lesion of concern in the upper interpolar region of the  right kidney demonstrates low T2 signal and intrinsic T1 shortening.  This lesion shows no enhancement following contrast and is  consistent with a hemorrhagic cyst. As demonstrated on the CT, there  are multiple simple cysts bilaterally, the largest on the left  involving the upper pole and measuring 8.1 cm and the largest on the  right located posteriorly and measuring 3.3 cm. No enhancing renal  masses are identified. Both kidneys demonstrate cortical thinning.  Multiple hepatic cysts are noted. The gallbladder, biliary system  and pancreas appear normal. The spleen and adrenal glands appear  normal.  Prominent retroperitoneal and mesenteric fat is  noted. Patient is  status post lumbar fusion. Atherosclerotic changes appear stable.  IMPRESSION:  1. The small complex lesion in the upper interpolar region of the  right kidney is a complex cyst.  2. Multiple bilateral renal and hepatic simple cysts are also noted.  3. No enhancing mass or suspicious finding demonstrated.  Electronically Signed  By: Camie Patience M.D.  On: 08/02/2013 12:07   CT ANGIOGRAPHY OF THE HEART, CORONARY ARTERY, STRUCTURE, AND  MORPHOLOGY  MEDICATIONS:  Lopressor 5 mg, IV  Nitroglycerin 400 mcg, sublingual.  TECHNIQUE:  CT angiography of the coronary vessels was performed on a 256  channel system using prospective ECG gating. A scout and ECG-gated  noncontrast exam (for calcium scoring) were performed. Appropriate  delay was determined by bolus tracking after injection of iodinated  contrast, and an ECG-gated coronary CTA was performed with sub-mm  slice collimation during late diastole. Imaging post processing was  performed on an independent work station creating multiplanar and  3-D images, allowing for quantitative analysis of the heart and  coronary arteries. Note that this exam targets the heart and the  chest was not imaged in its entirety.  CONTRAST: 46mL OMNIPAQUE IOHEXOL 350 MG/ML SOLN  COMPARISON: No priors.  FINDINGS:  Technical quality: Excellent.  Heart rate: 60-63.  CORONARY ARTERIES:  Left Main: Secondary to the small caliber of this vessel distally  and extensive calcification, accurate assessment for stenosis is not  possible on today's examination. However, there is likely a stenosis  in excess of 50%.  LAD: Secondary to the small caliber of this vessel proximally and  extensive calcification, accurate assessment for stenosis is not  possible on today's examination. However, there is likely a stenosis  in excess of 50%.  Diagonals: Mildly diseased with mixed calcified and noncalcified  atherosclerotic plaque resulting in areas of  stenosis between 0-25%.  LCx: Mildly disease with mixed calcified noncalcified  atherosclerotic plaque resulting in stenosis from 0-25%.  OMs: Small caliber OM1 and OM2 with calcified plaque in the proximal  OM2 which appears to result in 25-50% stenosis (  although this is  difficult to accurately measure given the small caliber of the  vessel and extensive calcification in this region).  RCA: Small caliber non dominant vessel with mild disease resulting  in only 0-25% stenosis.  PDA: Patent.  Dominance: Left.  AORTA AND PULMONARY MEASUREMENTS:  Aortic root (21 - 54mm): 32mm at the annulus  70mm at the sinuses of Valsalva  2mm at the sinotubular junction  Ascending aorta ( < 40 mm): 44 mm  Descending aorta ( < 40 mm): 33 mm  Main pulmonaryartery: ( < 30 mm): 29 mm  LEFT VENTRICULAR FUNCTION -  Ejection fraction: 45% (estimated)  WALL MOTION ASSESSMENT -  Base: Mild hypokinesis.  Midventricle: Mild hypokinesis.  Apex: Mild hypokinesis.  CARDIAC MEASUREMENTS -  Left ventricular end-diastolic diameter: 58 mm  Left ventricular end-systolic diameter: 45 mm  Septal thickness: 11 mm  Posterior wall thickness: 76mm  Left atrial diameter: 63 mm  VALVES -  Aortic valve: Mildly thickened and calcified, without evidence of  significant stenosis or definite region or malcoaptation to suggest  significant insufficiency.  Mitral valve: Calcifications of the mitral annulus. Mitral leaflets  do not appear elongated or thickened (i.e., this does not appear to  be a myxomatous valve). Dynamic multiplanar reconstructions through  the valve demonstrate billowing (A1, P1 and P3), but no frank  prolapse. During systole there does appear to be a tiny a central  region of malcoaptation between A2 and P2.  OTHER FINDINGS:  5 mm nodule in the right middle lobe (image 53 of series 4). Small  calcified granuloma in the posterior aspect of the right lower lobe.  Some dependent areas of interstitial  prominence and subpleural  reticulation in the lung bases bilaterally. No frank honeycombing or  traction bronchiectasis. Incompletely visualized low-attenuation  lesion in the left lobe of the liver measuring at least 2.3 cm in  diameter. No aggressive appearing lytic or blastic lesions are noted  in the visualized portions of the skeleton.  IMPRESSION:  1. The mitral valve does not appear myxomatous. Dynamic images  demonstrate some billowing of portions of the valve (A1, P1 and P3),  but no frank prolapse. During systole, there does appear to be a  tiny central region of mild coarctation between A2 and P2, which may  account for the central regurgitation noted on echocardiography.  Mitral annular calcifications are present.  2. Multivessel coronary artery disease, as discussed above. This  appears generally nonobstructive, however, secondary to the high  degree of calcification in the distal left main and proximal left  anterior descending coronary arteries in particular, some portions  of the coronary arteries cannot be accurately evaluated on today's  exam.  3. Mild left ventricular dilatation with mildly reduced resting left  ventricular systolic function (mild global hypokinesis with  estimated ejection fraction of 45%), but no regional wall motion  abnormalities.  4. Severe left atrial dilatation.  5. Aortic valve sclerosis, without evidence to suggest stenosis or  significant insufficiency.  6. 5 mm right middle lobe nodule. If the patient is at high risk for  bronchogenic carcinoma, follow-up chest CT at 6-12 months is  recommended. If the patient is at low risk for bronchogenic  carcinoma, follow-up chest CT at 12 months is recommended. This  recommendation follows the consensus statement: Guidelines for  Management of Small Pulmonary Nodules Detected on CT Scans: A  Statement from the [place] as published in Radiology  2005;237:395-400.  Electronically Signed   By: Quillian Quince  Entrikin M.D.  On: 07/26/2013 13:42     CARDIAC CATHETERIZATION   History obtained from chart review.The patient is a 78 y/o male who presents to [facility] to undergo a diagnostic LHC, for coronary assessment, prior to undergoing possible Mitral Valve replacement/repair. He has been referred by Dr. Roxy Manns. In addition to Daniel, he has a history of atrial fibrillation and has been followed by Dr. Caryl Comes for this. In 2011, he underwent successful DCCV and has been maintaining NSR on flecainide. He also underwent a LHC by Dr. Burt Knack in Jan. 2013 for evaluation of SOB. At that time, he was noted to have at least moderate mitral regurgitation although pulmonary artery pressures were not elevated. Since then, the patient complains that he has developed further progression of exertional shortness of breath and fatigue. A 2D echo performed at [facility] on 07/11/13 demonstrated moderate left ventricular chamber enlargement with severe mitral regurgitation. 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  PROCEDURE DESCRIPTION:  The patient was brought to the second floor [facility] cath lab in the postabsorptive state. He was premedicated with Valium 5 mg by mouth. His right wristwas prepped and shaved in usual sterile fashion. Xylocaine 1% was used for local anesthesia. A 5 French sheath was inserted into the right radial artery using standard Seldinger technique. The patient received  5000 units of heparin intravenously. A 5 [place] PIC catheter was used along with a 5 [place] XP 4 guide catheter to perform coronary angiography. Left ventriculography was performed to conserve dye since this synchronies 1.8 and he had already had a 2-D echocardiogram. A total of 80 cc of contrast was administered to the patient. Visipaque dye was used for the entirety of the case. Retrograde aortic  pressure was monitored during the case.  HEMODYNAMICS:  AO SYSTOLIC/AO DIASTOLIC: 0000000  ANGIOGRAPHIC RESULTS:  1. Left main; mild irregularities  2. LAD; widely patent. There was a very small high first diagonal branch less than 1 mm in diameter that had a 70% proximal stenosis  3. Left circumflex; codominant and free of obstructive disease.  4. Right coronary artery; codominant and free of obstructive disease  7. Left ventriculography;not performed today  IMPRESSION:Daniel Mayo has nonobstructive CAD. The TR band was placed on the right wrist after the sheath was removed and patent hemostasis was obtained. The patient left the Cath Lab in stable condition. He'll be hydrated for 2 hours, discharged home and will followup with Dr. Claudie Leach and Roxy Manns were notified of the results of the cath. He is now cleared for his mitral valve repair operation with Dr. Starleen Arms. MD, [facility]  08/06/2013  1:20 PM      Impression:  Patient has severe symptomatic mitral regurgitation with moderate left ventricular systolic dysfunction and moderate-severe left ventricular chamber enlargement.  Review of the anatomy of the patient's previous transthoracic and transesophageal echocardiograms demonstrate what may be primarily type I mitral valve dysfunction. There is some calcification in the mitral annulus. There is moderate to severe left atrial enlargement and mild to moderate right atrial enlargement with moderate tricuspid regurgitation. Cardiac catheterization is notable for nonobstructive coronary artery disease.   Plan:  The rationale for elective mitral valve repair surgery has been explained, including a comparison between surgery and continued medical therapy with close follow-up.  The likelihood of successful and durable valve repair has been discussed with particular reference to the findings of  their recent echocardiogram.  Based upon these findings and previous experience, I have quoted  them a greater than 90 percent likelihood of successful valve repair.  In the unlikely event that their valve cannot be successfully repaired, we discussed the possibility of replacing the mitral valve using a mechanical prosthesis with the attendant need for long-term anticoagulation versus the alternative of replacing it using a bioprosthetic tissue valve with its potential for late structural valve deterioration and failure, depending upon the patient's longevity.  The patient specifically requests that if the mitral valve must be replaced that it be done using a bioprosthetic tissue valve.  We also discussed the possibility that concomitant tricuspid valve repair may be indicated and we reviewed the relative risks and benefits of concomitant Maze procedure.  Alternative surgical approaches have been discussed including a comparison between conventional sternotomy and minimally-invasive techniques.  The relative risks and benefits of each have been reviewed as they pertain to the patient's specific circumstances, and all of their questions have been addressed.  Specific risks potentially related to the minimally-invasive approach were discussed at length, including but not limited to risk of conversion to full or partial sternotomy, aortic dissection or other major vascular complication, unilateral acute lung injury or pulmonary edema, phrenic nerve dysfunction or paralysis, rib fracture, chronic pain, lung hernia, or lymphocele.   The patient understands and accepts all potential associated risks of surgery including but not limited to risk of death, stroke, myocardial infarction, congestive heart failure, respiratory failure, renal failure, bleeding requiring blood transfusion and/or reexploration, arrhythmia, heart block or bradycardia requiring permanent pacemaker, pneumonia, pleural effusion, wound infection, pulmonary embolus or other thromboembolic complication, chronic pain or other delayed complications  related to valve repair or replacement.  All questions answered.  We plan to proceed with surgery on Tuesday, 08/13/2013.      Valentina Gu. Roxy Manns, MD 08/09/2013 11:10 AM

## 2013-08-09 NOTE — Patient Instructions (Addendum)
Stop taking aspirin now.  Stop taking Cozaar (losartan) on Monday.  Continue all other medications without change.  On the morning of surgery take only Prilosec (omeprazole) and Calan (verapamil) with a sip of water.

## 2013-08-09 NOTE — Progress Notes (Signed)
VASCULAR LAB PRELIMINARY  PRELIMINARY  PRELIMINARY  PRELIMINARY  Pre-op Cardiac Surgery  Carotid Findings:  Bilateral:  1-39% ICA stenosis.  Vertebral artery flow is antegrade.     Upper Extremity Right Left  Brachial Pressures 170 triphasic 170 triphasic  Radial Waveforms monophasic biphasic  Ulnar Waveforms monophasic monophasic  Palmar Arch (Allen's Test) WNL WNL   Findings:  Doppler waveforms remain normal with ulnar and radial compressions bilaterally.    Ilanna Deihl, RVT 08/09/2013, 2:13 PM

## 2013-08-09 NOTE — Pre-Procedure Instructions (Signed)
JUSTN ENTERLINE  08/09/2013   Your procedure is scheduled on:  November 18  Report to [facility] Entrance "A" [address] at [employer] AM.  Call this number if you have problems the morning of surgery: [phone]   Remember:   Do not eat food or drink liquids after midnight.   Take these medicines the morning of surgery with A SIP OF WATER: Flecainide, Prilosec, Verapamil, Finasteride   STOP Prostate supplement and Aspirin today   STOP Aspirin, Aleve, Naproxen, Advil, Ibuprofen, Vitamin, Herbs, or Supplements starting today   Do not wear jewelry, make-up or nail polish.  Do not wear lotions, powders, or perfumes. You may wear deodorant.  Do not shave 48 hours prior to surgery. Men may shave face and neck.  Do not bring valuables to the hospital.  [facility] is not responsible                  for any belongings or valuables.               Contacts, dentures or bridgework may not be worn into surgery.  Leave suitcase in the car. After surgery it may be brought to your room.  For patients admitted to the hospital, discharge time is determined by your                treatment team.               Special Instructions: Shower using CHG 2 nights before surgery and the night before surgery.  If you shower the day of surgery use CHG.  Use special wash - you have one bottle of CHG for all showers.  You should use approximately 1/3 of the bottle for each shower.   Please read over the following fact sheets that you were given: Pain Booklet, Coughing and Deep Breathing, Blood Transfusion Information, Open Heart Packet and Surgical Site Infection Prevention

## 2013-08-09 NOTE — Progress Notes (Signed)
08/09/13 1435  OBSTRUCTIVE SLEEP APNEA  Have you ever been diagnosed with sleep apnea through a sleep study? No  Do you snore loudly (loud enough to be heard through closed doors)?  0  Do you often feel tired, fatigued, or sleepy during the daytime? 1 (recently reports d/t heart)  Has anyone observed you stop breathing during your sleep? 0  Do you have, or are you being treated for high blood pressure? 1  BMI more than 35 kg/m2? 0  Age over 76 years old? 1  Neck circumference greater than 40 cm/18 inches? 0  Gender: 1  Obstructive Sleep Apnea Score 4  Score 4 or greater  Results sent to PCP

## 2013-08-10 NOTE — H&P (Signed)
[address]       [place] [address]             [phone]          CARDIOTHORACIC SURGERY HISTORY AND PHYSICAL EXAM  Primary Care Physician is Biagio Borg, MD Referring Provider is Franchot Mimes, MD    Chief Complaint   Patient presents with   .  Mitral Regurgitation       Surgical eval Severe MR, TEE 07/11/13, ECHO 03/21/13,Nuclear 03/21/13   .  Atrial Fibrillation     HPI:  Patient is a 78 year old retired white male with long-standing history of mitral regurgitation and atrial fibrillation referred by Dr. Claudie Leach for possible surgical intervention. The patient states that he was first told that he had a heart murmur during childhood. He states that he was severely ill with pneumonia and treated with bedrest for several months. He does not recall ever being told that he had rheumatic fever. He was told that ever since then he has had a heart murmur. For the last several years he has been followed at [facility] in [facility], most recently by Dr. Claudie Leach.   In 2011 he developed persistent atrial fibrillation and was referred to Dr. Caryl Comes performed DC cardioversion. The patient has been maintaining sinus rhythm on oral flecainide.  Despite maintaining sinus rhythm the patient has developed progressive exertional shortness of breath. In January 2013 he was referred for diagnostic cardiac catheterization. This was performed by Dr. Burt Knack and notable for the absence of significant coronary artery disease. At that time patient was noted to have at least moderate mitral regurgitation although pulmonary artery pressures were not elevated. Since then the patient complains that he has developed further progression of exertional shortness of breath and fatigue.  He underwent transesophageal echocardiogram on 07/11/2013 by Dr. Claudie Leach.  He was found to have moderate left ventricular chamber enlargement with severe mitral regurgitation. The mitral valve was  felt to have myxomatous degeneration with "mild bileaflet prolapse."  The patient was referred for possible elective surgical intervention.  Patient returns for follow up of severe symptomatic mitral regurgitation and atrial fibrillation.  He was originally seen in consultation on 07/15/2013.  Since then he underwent CT angiogram of the abdomen/pelvis.  This revealed moderate atherosclerotic disease without significant flow-limiting obstruction.  It also revealed a 69mm lesion in the right kidney which was found to be consistent with a complex benign cyst on subsequent MR imaging.  Cardiac-gated CT imaging of the heart was performed in an effort to avoid repeat cardiac catheterization, but CT imaging suggested the possibility of significant left main and proximal LAD stenosis.  Subsequently coronary arteriography was performed earlier this week by Dr Gwenlyn Found and notable for moderate non-obstructive CAD without and clinically significant flow limiting stenosis.  Mr Barbati also underwent transthoracic echocardiogram which confirmed the presence of severe LV chamber enlargement least moderate mitral regurgitation and at least moderate global LV systolic dysfunction with EF 45% in the setting of mitral regurgitation.  In addition, there was moderate to severe LA chamber enlargement, mild to moderate RA chamber enlargement and moderate tricuspid regurgitation.  Patient returns to the office today to discuss these findings and make final plans for surgical intervention.  The patient describes a gradual progression of symptoms of exertional shortness of breath, currently functional class II to class III. The patient states that he usually does not get short of breath with ordinary activity but  he frequently will with strenuous activity. Sometimes his breathing is worse and he can be short of breath even just walking up a slight incline or a single flight of stairs. He sometimes gets short of breath at night when he  sleeping, particularly if he lies flat in bed. He denies any history resting shortness of breath, PND, chest pain, dizzy spells, or syncope. He has occasional palpitations. He has had some lower extremity edema in the past, but none recently. He otherwise remains fairly active physically and his primary complaint is that of long-standing shortness of breath and fatigue. He has mild chronic low back pain that does not seem to slow him down physically.   Past Medical History  Diagnosis Date  . AF (atrial fibrillation)   . CKD (chronic kidney disease)   . SOB (shortness of breath)   . Erectile dysfunction   . Left ventricular hypertrophy   . GERD (gastroesophageal reflux disease)   . Hyperlipidemia   . Coronary artery disease   . Renal disorder   . Mitral regurgitation 07/12/2013  . Hypertension   . Somnolence   . Hypogonadism male   . Insomnia   . HOH (hard of hearing)   . Allergic rhinitis   . Mitral regurgitation   . Elevated PSA   . Atrial fibrillation 02/17/2010    Persistent atrial fibrillation    . Chronic kidney disease (CKD)   . Chronic back pain   . Tricuspid regurgitation   . Stenosis of carotid artery     Past Surgical History  Procedure Laterality Date  . Hernia repair Bilateral   . Cardioversion  02/26/2010  . Lumbar spine surgery    . Cardiac catheterization  x 2  . Tonsillectomy  age 50    Family History  Problem Relation Age of Onset  . Diabetes    . Hypertension    . Coronary artery disease      Social History History  Substance Use Topics  . Smoking status: Former Research scientist (life sciences)  . Smokeless tobacco: Never Used     Comment: stopped smoking in his 20's  . Alcohol Use: 8.4 oz/week    14 Glasses of wine per week     Comment: 1- 2 glasses of wine a night    Prior to Admission medications   Medication Sig Start Date End Date Taking? Authorizing Provider  aspirin 81 MG tablet Take 81 mg by mouth daily.   Yes Historical Provider, MD  finasteride (PROSCAR)  5 MG tablet Take 5 mg by mouth daily.   Yes Historical Provider, MD  flecainide (TAMBOCOR) 50 MG tablet Take 50 mg by mouth daily.   Yes Historical Provider, MD  losartan (COZAAR) 50 MG tablet Take 50 mg by mouth daily.   Yes Historical Provider, MD  NITROSTAT 0.4 MG SL tablet Place 0.4 mg under the tongue every 5 (five) minutes as needed for chest pain.  09/28/11  Yes Historical Provider, MD  omeprazole (PRILOSEC) 20 MG capsule Take 20 mg by mouth daily.    Yes Historical Provider, MD  OVER THE COUNTER MEDICATION Take 1 tablet by mouth 2 (two) times daily. Supplement for the Prostate - Prosvent   Yes Historical Provider, MD  verapamil (CALAN) 120 MG tablet Take 120 mg by mouth daily.   Yes Historical Provider, MD  OVER THE COUNTER MEDICATION Place 1 drop into both eyes 2 (two) times daily as needed (itching/allergies). Over the counter eye drops    Historical Provider, MD  No Known Allergies   Review of Systems:              General:                      normal appetite, decreased energy, no weight gain, no weight loss, no fever             Cardiac:                      no chest pain with exertion, no chest pain at rest, + SOB with  exertion, no resting SOB, no PND, + orthopnea, + palpitations, + arrhythmia, + atrial fibrillation, no LE edema, no dizzy spells, no syncope             Respiratory:                + exertional shortness of breath, no home oxygen, no productive cough, no dry cough, no bronchitis, no wheezing, no hemoptysis, no asthma, no pain with inspiration or cough, no sleep apnea, no CPAP at night             GI:                                no difficulty swallowing, no reflux, no frequent heartburn, no hiatal hernia, no abdominal pain, + constipation, no diarrhea, no hematochezia, no hematemesis, no melena             GU:                              no dysuria,  + frequency, no urinary tract infection, no hematuria, + enlarged prostate, no kidney stones, + mild chronic kidney  disease             Vascular:                     no pain suggestive of claudication, no pain in feet, no leg cramps, no varicose veins, no DVT, no non-healing foot ulcer             Neuro:                         no stroke, no TIA's, no seizures, no headaches, no temporary blindness one eye,  no slurred speech, + peripheral neuropathy with numbness in feet, + chronic pain in lower back, no instability of gait, no memory/cognitive dysfunction             Musculoskeletal:         + arthritis, no joint swelling, no myalgias, no difficulty walking, normal mobility               Skin:                            no rash, no itching, no skin infections, no pressure sores or ulcerations             Psych:                         no anxiety, no depression, no nervousness, no unusual recent stress             Eyes:  no blurry vision, no floaters, no recent vision changes, no wears glasses or contacts             ENT:                            + hearing loss, no loose or painful teeth, no dentures, last saw dentist 1 year ago             Hematologic:               no easy bruising, no abnormal bleeding, no clotting disorder, no frequent epistaxis             Endocrine:                   no diabetes, does not check CBG's at home                           Physical Exam:              BP 142/84  Pulse 72  Resp 20  Ht 6\' 2"  (1.88 m)  Wt 235 lb (106.595 kg)  BMI 30.16 kg/m2  SpO2 98%             General:                        well-appearing             HEENT:                       Unremarkable               Neck:                           no JVD, no bruits, no adenopathy               Chest:                         clear to auscultation, symmetrical breath sounds, no wheezes, no rhonchi               CV:                              RRR, grade III/VI holosystolic murmur best at apex             Abdomen:                    soft, non-tender, no masses               Extremities:                  warm, well-perfused, pulses diminished but palpable, no LE edema             Rectal/GU                   Deferred             Neuro:                         Grossly non-focal and symmetrical throughout  Skin:                            Clean and dry, no rashes, no breakdown   Diagnostic Tests:  TRANSESOPHAGEAL ECHOCARDIOGRAM  Report of transesophageal echocardiogram performed 07/11/2013 at [facility] is reviewed. By report there was moderate left ventricular chamber enlargement with ejection fraction estimated 50%. There was marked in left atrial enlargement. There is normal right ventricular size and function. There was mild right atrial enlargement. The mitral valve appeared "thickened with myxomatous degeneration and mild bileaflet mitral valve prolapse". Using PISA there was severe mitral regurgitation with calculated ERO 0.31 and regurgitant volume 75 mL.  The aortic valve and tricuspid valve reportedly appeared normal. There was no evidence for intra-coronary shunt using saline contrast or color flow imaging.  Images from transesophageal echocardiogram performed 07/11/2013 at [facility] have been reviewed. The patient has severe mitral regurgitation. There is predominantly type I dysfunction with essentially normal leaflet motion. There is very mild thickening of both the anterior and posterior leaflets but overall they move fairly well. There is some billowing without any definite prolapse of either leaflet above the plane of the annulus. There is some annular calcification. The subvalvular apparatus appeared essentially normal and without significant thickening or foreshortening of any of the cords. Conversely, there was no sign of chordal rupture or prolapse. The jet of mitral regurgitation is central. The aortic valve appears normal. There is no significant aortic insufficiency. There is mild tricuspid regurgitation. The tricuspid annulus does not  appear dilated. Left ventricular systolic function was mildly reduced with ejection fraction estimated 50%. There was severe left atrial enlargement.     Transthoracic Echocardiography  Patient: Felder, Criscione MR #: [id number] Study Date: 07/19/2013 Gender: M Age: 71 Height: 188cm Weight: 111.1kg BSA: 2.84m^2 Pt. Status: Room:  PERFORMING [place], [facility] ATTENDING Darylene Price MD Andrena Mews MD REFERRING Darylene Price MD [place], Raymond SONOGRAPHER Jimmy Reel cc:  ------------------------------------------------------------ LV EF: 45% - 50%  ------------------------------------------------------------ Indications: V728.1 Pre-op evaluation. Mitral regurgitation 424.0.  ------------------------------------------------------------ Study Conclusions  - Left ventricle: The cavity size was severely dilated. Wall thickness was increased in a pattern of mild LVH. Systolic function was mildly reduced. The estimated ejection fraction was in the range of 45% to 50%. Left ventricular diastolic function parameters were normal. - Aortic valve: Trivial regurgitation. - Mitral valve: Calcified annulus. Moderate regurgitation. - Left atrium: The atrium was moderately to severely dilated. - Right ventricle: The cavity size was mildly dilated. - Right atrium: The atrium was moderately dilated. - Tricuspid valve: Moderate regurgitation. - Pulmonary arteries: PA peak pressure: 32mm Hg (S). Echocardiography. M-mode, complete 2D, spectral Doppler, and color Doppler. Height: Height: 188cm. Height: 74in. Weight: Weight: 111.1kg. Weight: 244.5lb. Body mass index: BMI: 31.5kg/m^2. Body surface area: BSA: 2.16m^2. Blood pressure: 140/96. Patient status: Inpatient. Location: Echo laboratory.  ------------------------------------------------------------  ------------------------------------------------------------ Left ventricle: The cavity size was  severely dilated. Wall thickness was increased in a pattern of mild LVH. Systolic function was mildly reduced. The estimated ejection fraction was in the range of 45% to 50%. The transmitral flow pattern was normal. The deceleration time of the early transmitral flow velocity was normal. The pulmonary vein flow pattern was normal. The tissue Doppler parameters were normal. Left ventricular diastolic function parameters were normal.  ------------------------------------------------------------ Aortic valve: Trileaflet. Doppler: There was no stenosis. Trivial regurgitation.  ------------------------------------------------------------ Aorta: Ascending aorta: The ascending aorta was mildly dilated.  ------------------------------------------------------------  Mitral valve: Calcified annulus. Doppler: Moderate regurgitation. Peak gradient: 74mm Hg (D).  ------------------------------------------------------------ Left atrium: The atrium was moderately to severely dilated.  ------------------------------------------------------------ Right ventricle: The cavity size was mildly dilated. Systolic function was normal.  ------------------------------------------------------------ Pulmonic valve: Structurally normal valve. Cusp separation was normal. Doppler: Transvalvular velocity was within the normal range. No regurgitation.  ------------------------------------------------------------ Tricuspid valve: Poorly visualized. Doppler: Moderate regurgitation.  ------------------------------------------------------------ Right atrium: The atrium was moderately dilated.  ------------------------------------------------------------ Pericardium: There was no pericardial effusion.  ------------------------------------------------------------ Systemic veins: Inferior vena cava: The vessel was dilated; the respirophasic diameter changes were blunted (< 50%); findings are consistent with  elevated central venous pressure.  ------------------------------------------------------------  2D measurements Normal Doppler Normal Left ventricle measurements LVID ED, 64.3 mm 43-52 Main pulmonary chord, artery PLAX Pressure, S 42 mm =30 LVID ES, 53.3 mm 23-38 Hg chord, Left ventricle PLAX Ea, lat 10.3 cm/ ------- FS, chord, 17 % >29 ann, tiss s PLAX DP LVPW, ED 11.4 mm ------ E/Ea, lat 10.78 ------- IVS/LVPW 1.09 <1.3 ann, tiss ratio, ED DP Ventricular septum Ea, med 7.4 cm/ ------- IVS, ED 12.4 mm ------ ann, tiss s LVOT DP Diam, S 22 mm ------ E/Ea, med 15 ------- Area 3.8 cm^2 ------ ann, tiss Aorta DP Root diam, 41 mm ------ Mitral valve ED Peak E vel 111 cm/ ------- Left atrium s AP dim 55 mm ------ Peak A vel 82.5 cm/ ------- AP dim 2.32 cm/m^2 <2.2 s index Deceleratio 201 ms 150-230 n time Peak 5 mm ------- gradient, D Hg Peak E/A 1.3 ------- ratio Tricuspid valve Regurg peak 284 cm/ ------- vel s Peak RV-RA 32 mm ------- gradient, S Hg Max regurg 284 cm/ ------- vel s Systemic veins Estimated 10 mm ------- CVP Hg Right ventricle Pressure, S 42 mm <30 Hg Sa vel, lat 9.7 cm/ ------- ann, tiss s DP  ------------------------------------------------------------ Prepared and Electronically Authenticated by  Darlin Coco 2014-10-24T14:42:[id number]    CT ANGIOGRAPHY CHEST, ABDOMEN AND PELVIS  TECHNIQUE:   Multidetector CT imaging through the chest, abdomen and pelvis was   performed using the standard protocol during bolus administration of   intravenous contrast. Multiplanar reconstructed images including   MIPs were obtained and reviewed to evaluate the vascular anatomy.   CONTRAST: 151mL OMNIPAQUE IOHEXOL 350 MG/ML SOLN   COMPARISON: 07/26/2013   FINDINGS:   CTA CHEST FINDINGS   Adequate contrast opacification of the thoracic aorta with no   evidence of dissection, aneurysm, or stenosis. There is classic   3-vessel brachiocephalic arch  anatomy without proximal stenosis.   Scattered partially calcified atheromatous plaque in the arch and   descending thoracic segments.   There is adequate contrast opacification pulmonary arterial tree ;   the exam was not optimized for detection of pulmonary emboli. Patchy   coronary arterial calcifications. No pleural or pericardial   effusion. No hilar or mediastinal adenopathy. Scattered subpleural   blebs in both lung apices. Ground-glass opacities in the dependent   aspect of bilateral upper lower lobes, may represent some mild   subsegmental atelectasis , edema or alveolitis. No confluent   airspace consolidation. Stable 5 mm nodule in the anterior aspect   right middle lobe, image 104/6. Benign hemangioma in T5 vertebral   body and posterior elements without complicating features. Small   endplate spurs noted at multiple contiguous levels in the mid and   lower thoracic spine.   Review of the MIP images confirms the above findings.   CTA ABDOMEN AND PELVIS FINDINGS  Arterial findings:   Aorta: Moderate calcified atheromatous plaque. No dissection,   aneurysm, or stenosis.   Celiac axis: Nonocclusive partially calcified ostial plaque. Patent   distally. Scattered calcified plaque in the tortuous splenic artery.   Superior mesenteric: Proximal plaque extending over a length of 2.4   cm from the origin resulting in less than 50% diameter stenosis.   Patent distally. Replaced right hepatic arterial supply, an anatomic   variant. .   Left renal: Duplicated. The superior is dominant, with mild plaque   extending over a length of approximately 16 mm from the ostium,   resulting in less than 50% diameter stenosis. Patent distally.   Inferior left renal artery is diminutive, supplying a portion of the   lower pole.   Right renal: Single, with partially calcified ostial plaque   extending over a length of 15 mm resulting in less than 50% diameter   stenosis. Widely patent distally.  The   Inferior mesenteric: Patent   Left iliac: Atheromatous calcification through the common iliac   artery. External iliac and common femoral unremarkable. Mild plaque   in the internal iliac. No significant tortuosity.   Right iliac: Scattered atheromatous plaque throughout the common   iliac without aneurysm or stenosis. External iliac unremarkable.   Mild eccentric plaque in the common femoral artery. Internal iliac   patent, normal in caliber. No significant tortuosity.   Venous findings: Dedicated venous phase imaging not obtained.   Review of the MIP images confirms the above findings.   Nonvascular findings: 14 mm dense or enhancing exophytic lesion from   the upper pole right kidney. Multiple simple appearing bilateral   renal cysts, largest on the left from the upper pole 7.7 cm maximum   transverse diameter, on the right in the interpolar region 3.4 cm.   Multiple hepatic cysts. Unremarkable pancreas, adrenal glands,   spleen. Stomach, small bowel, and colon are nondilated. Scattered   sigmoid diverticula without adjacent inflammatory/edematous change.   Urinary bladder incompletely distended. Moderate prostatic   enlargement. No ascites. No free air. Normal appendix. No   adenopathy. Changes of PLIF L2-3. Facet degenerative changes in the   lower lumbar spine.   IMPRESSION:   1. Scattered plaque through the thoracic and abdominal aorta and   iliac arterial system without dissection, stenosis, or significant   tortuosity.   2. Mild renal and visceral arterial plaque without high-grade   stenosis.   3. 14 mm dense or enhancing right upper lobe renal lesion. Consider   renal MR with contrast to differentiate hyperdense cyst from small   renal cell carcinoma. This recommendation follows ACR consensus   guidelines: Managing Incidental Findings on Abdominal CT: White   Paper of the ACR Incidental Findings Committee. J Am Coll Radiol   2010;7:754-773   4. Bilateral renal and  hepatic cysts.   5. Sigmoid diverticulosis.   Electronically Signed   By: Arne Cleveland M.D.   On: 07/31/2013 14:17     MRI ABDOMEN WITH AND WITHOUT CONTRAST  TECHNIQUE:   Multiplanar multisequence MR imaging of the abdomen was performed   both before and after administration of intravenous contrast.   CONTRAST: 40mL MULTIHANCE GADOBENATE DIMEGLUMINE 529 MG/ML IV SOLN   ; half dose utilized secondary to renal insufficiency.   COMPARISON: Abdominal CTA 07/31/2013.   FINDINGS:   The 13 mm lesion of concern in the upper interpolar region of the   right kidney demonstrates low T2 signal and intrinsic T1 shortening.  This lesion shows no enhancement following contrast and is   consistent with a hemorrhagic cyst. As demonstrated on the CT, there   are multiple simple cysts bilaterally, the largest on the left   involving the upper pole and measuring 8.1 cm and the largest on the   right located posteriorly and measuring 3.3 cm. No enhancing renal   masses are identified. Both kidneys demonstrate cortical thinning.   Multiple hepatic cysts are noted. The gallbladder, biliary system   and pancreas appear normal. The spleen and adrenal glands appear   normal.   Prominent retroperitoneal and mesenteric fat is noted. Patient is   status post lumbar fusion. Atherosclerotic changes appear stable.   IMPRESSION:   1. The small complex lesion in the upper interpolar region of the   right kidney is a complex cyst.   2. Multiple bilateral renal and hepatic simple cysts are also noted.   3. No enhancing mass or suspicious finding demonstrated.   Electronically Signed   By: Camie Patience M.D.   On: 08/02/2013 12:07   CT ANGIOGRAPHY OF THE HEART, CORONARY ARTERY, STRUCTURE, AND   MORPHOLOGY   MEDICATIONS:   Lopressor 5 mg, IV   Nitroglycerin 400 mcg, sublingual.   TECHNIQUE:   CT angiography of the coronary vessels was performed on a 256   channel system using prospective ECG gating. A scout  and ECG-gated   noncontrast exam (for calcium scoring) were performed. Appropriate   delay was determined by bolus tracking after injection of iodinated   contrast, and an ECG-gated coronary CTA was performed with sub-mm   slice collimation during late diastole. Imaging post processing was   performed on an independent work station creating multiplanar and   3-D images, allowing for quantitative analysis of the heart and   coronary arteries. Note that this exam targets the heart and the   chest was not imaged in its entirety.   CONTRAST: 66mL OMNIPAQUE IOHEXOL 350 MG/ML SOLN   COMPARISON: No priors.   FINDINGS:   Technical quality: Excellent.   Heart rate: 60-63.   CORONARY ARTERIES:   Left Main: Secondary to the small caliber of this vessel distally   and extensive calcification, accurate assessment for stenosis is not   possible on today's examination. However, there is likely a stenosis   in excess of 50%.   LAD: Secondary to the small caliber of this vessel proximally and   extensive calcification, accurate assessment for stenosis is not   possible on today's examination. However, there is likely a stenosis   in excess of 50%.   Diagonals: Mildly diseased with mixed calcified and noncalcified   atherosclerotic plaque resulting in areas of stenosis between 0-25%.   LCx: Mildly disease with mixed calcified noncalcified   atherosclerotic plaque resulting in stenosis from 0-25%.   OMs: Small caliber OM1 and OM2 with calcified plaque in the proximal   OM2 which appears to result in 25-50% stenosis (although this is   difficult to accurately measure given the small caliber of the   vessel and extensive calcification in this region).   RCA: Small caliber non dominant vessel with mild disease resulting   in only 0-25% stenosis.   PDA: Patent.   Dominance: Left.   AORTA AND PULMONARY MEASUREMENTS:   Aortic root (21 - 26mm): 90mm at the annulus   40mm at the sinuses of Valsalva   53mm  at the sinotubular junction   Ascending aorta ( < 40 mm): 44 mm  Descending aorta ( < 40 mm): 33 mm   Main pulmonaryartery: ( < 30 mm): 29 mm   LEFT VENTRICULAR FUNCTION -   Ejection fraction: 45% (estimated)   WALL MOTION ASSESSMENT -   Base: Mild hypokinesis.   Midventricle: Mild hypokinesis.   Apex: Mild hypokinesis.   CARDIAC MEASUREMENTS -   Left ventricular end-diastolic diameter: 58 mm   Left ventricular end-systolic diameter: 45 mm   Septal thickness: 11 mm   Posterior wall thickness: 90mm   Left atrial diameter: 63 mm   VALVES -   Aortic valve: Mildly thickened and calcified, without evidence of   significant stenosis or definite region or malcoaptation to suggest   significant insufficiency.   Mitral valve: Calcifications of the mitral annulus. Mitral leaflets   do not appear elongated or thickened (i.e., this does not appear to   be a myxomatous valve). Dynamic multiplanar reconstructions through   the valve demonstrate billowing (A1, P1 and P3), but no frank   prolapse. During systole there does appear to be a tiny a central   region of malcoaptation between A2 and P2.   OTHER FINDINGS:   5 mm nodule in the right middle lobe (image 53 of series 4). Small   calcified granuloma in the posterior aspect of the right lower lobe.   Some dependent areas of interstitial prominence and subpleural   reticulation in the lung bases bilaterally. No frank honeycombing or   traction bronchiectasis. Incompletely visualized low-attenuation   lesion in the left lobe of the liver measuring at least 2.3 cm in   diameter. No aggressive appearing lytic or blastic lesions are noted   in the visualized portions of the skeleton.   IMPRESSION:   1. The mitral valve does not appear myxomatous. Dynamic images   demonstrate some billowing of portions of the valve (A1, P1 and P3),   but no frank prolapse. During systole, there does appear to be a   tiny central region of mild coarctation between  A2 and P2, which may   account for the central regurgitation noted on echocardiography.   Mitral annular calcifications are present.   2. Multivessel coronary artery disease, as discussed above. This   appears generally nonobstructive, however, secondary to the high   degree of calcification in the distal left main and proximal left   anterior descending coronary arteries in particular, some portions   of the coronary arteries cannot be accurately evaluated on today's   exam.   3. Mild left ventricular dilatation with mildly reduced resting left   ventricular systolic function (mild global hypokinesis with   estimated ejection fraction of 45%), but no regional wall motion   abnormalities.   4. Severe left atrial dilatation.   5. Aortic valve sclerosis, without evidence to suggest stenosis or   significant insufficiency.   6. 5 mm right middle lobe nodule. If the patient is at high risk for   bronchogenic carcinoma, follow-up chest CT at 6-12 months is   recommended. If the patient is at low risk for bronchogenic   carcinoma, follow-up chest CT at 12 months is recommended. This   recommendation follows the consensus statement: Guidelines for   Management of Small Pulmonary Nodules Detected on CT Scans: A   Statement from the [employer] as published in Radiology   2005;237:395-400.   Electronically Signed   By: Vinnie Langton M.D.   On: 07/26/2013 13:42     CARDIAC CATHETERIZATION   History obtained from chart review.The patient  is a 78 y/o male who presents to [facility] to undergo a diagnostic LHC, for coronary assessment, prior to undergoing possible Mitral Valve replacement/repair. He has been referred by Dr. Roxy Manns. In addition to MR, he has a history of atrial fibrillation and has been followed by Dr. Caryl Comes for this. In 2011, he underwent successful DCCV and has been maintaining NSR on flecainide. He also underwent a LHC by Dr. Burt Knack in Jan. 2013 for evaluation of SOB. At that  time, he was noted to have at least moderate mitral regurgitation although pulmonary artery pressures were not elevated. Since then, the patient complains that he has developed further progression of exertional shortness of breath and fatigue. A 2D echo performed at [facility] on 07/11/13 demonstrated moderate left ventricular chamber enlargement with severe mitral regurgitation. 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   PROCEDURE DESCRIPTION:   The patient was brought to the second floor [facility] cath lab in the postabsorptive state. He was premedicated with Valium 5 mg by mouth. His right wristwas prepped and shaved in usual sterile fashion. Xylocaine 1% was used for local anesthesia. A 5 French sheath was inserted into the right radial artery using standard Seldinger technique. The patient received   5000 units of heparin intravenously. A 5 [place] PIC catheter was used along with a 5 [place] XP 4 guide catheter to perform coronary angiography. Left ventriculography was performed to conserve dye since this synchronies 1.8 and he had already had a 2-D echocardiogram. A total of 80 cc of contrast was administered to the patient. Visipaque dye was used for the entirety of the case. Retrograde aortic pressure was monitored during the case.   HEMODYNAMICS:  AO SYSTOLIC/AO DIASTOLIC: 0000000   ANGIOGRAPHIC RESULTS:  1. Left main; mild irregularities   2. LAD; widely patent. There was a very small high first diagonal branch less than 1 mm in diameter that had a 70% proximal stenosis   3. Left circumflex; codominant and free of obstructive disease.   4. Right coronary artery; codominant and free of obstructive disease   7. 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 The TR band was placed on the right wrist after the sheath was  removed and patent hemostasis was obtained. The patient left the Cath Lab in stable condition. He'll be hydrated for 2 hours, discharged home and will followup with Dr. Claudie Leach and Roxy Manns were notified of the results of the cath. He is now cleared for his mitral valve repair operation with Dr. Starleen Arms. MD, [facility]   08/06/2013   1:20 PM       Impression:  Patient has severe symptomatic mitral regurgitation with moderate left ventricular systolic dysfunction and moderate-severe left ventricular chamber enlargement.  Review of the anatomy of the patient's previous transthoracic and transesophageal echocardiograms demonstrate what may be primarily type I mitral valve dysfunction. There is some calcification in the mitral annulus. There is moderate to severe left atrial enlargement and mild to moderate right atrial enlargement with moderate tricuspid regurgitation. Cardiac catheterization is notable for nonobstructive coronary artery disease.   Plan:  The rationale for elective mitral valve repair surgery has been explained, including a comparison between surgery and continued medical therapy with close follow-up.  The likelihood of successful and durable valve repair has been discussed with particular reference to the findings of their recent echocardiogram.  Based  upon these findings and previous experience, I have quoted them a greater than 90 percent likelihood of successful valve repair.  In the unlikely event that their valve cannot be successfully repaired, we discussed the possibility of replacing the mitral valve using a mechanical prosthesis with the attendant need for long-term anticoagulation versus the alternative of replacing it using a bioprosthetic tissue valve with its potential for late structural valve deterioration and failure, depending upon the patient's longevity.  The patient specifically requests that if the mitral valve must be replaced that it be done using a  bioprosthetic tissue valve.  We also discussed the possibility that concomitant tricuspid valve repair may be indicated and we reviewed the relative risks and benefits of concomitant Maze procedure.  Alternative surgical approaches have been discussed including a comparison between conventional sternotomy and minimally-invasive techniques.  The relative risks and benefits of each have been reviewed as they pertain to the patient's specific circumstances, and all of their questions have been addressed.  Specific risks potentially related to the minimally-invasive approach were discussed at length, including but not limited to risk of conversion to full or partial sternotomy, aortic dissection or other major vascular complication, unilateral acute lung injury or pulmonary edema, phrenic nerve dysfunction or paralysis, rib fracture, chronic pain, lung hernia, or lymphocele.   The patient understands and accepts all potential associated risks of surgery including but not limited to risk of death, stroke, myocardial infarction, congestive heart failure, respiratory failure, renal failure, bleeding requiring blood transfusion and/or reexploration, arrhythmia, heart block or bradycardia requiring permanent pacemaker, pneumonia, pleural effusion, wound infection, pulmonary embolus or other thromboembolic complication, chronic pain or other delayed complications related to valve repair or replacement.  All questions answered.  We plan to proceed with surgery on Tuesday, 08/13/2013.      Valentina Gu. Roxy Manns, MD 08/09/2013 11:10 AM

## 2013-08-12 ENCOUNTER — Other Ambulatory Visit: Payer: Self-pay | Admitting: *Deleted

## 2013-08-12 ENCOUNTER — Ambulatory Visit: Payer: Medicare PPO | Admitting: Thoracic Surgery (Cardiothoracic Vascular Surgery)

## 2013-08-12 DIAGNOSIS — B958 Unspecified staphylococcus as the cause of diseases classified elsewhere: Secondary | ICD-10-CM

## 2013-08-12 MED ORDER — MUPIROCIN 2 % EX OINT: 1.0000 "application " | TOPICAL_OINTMENT | Freq: Two times a day (BID) | CUTANEOUS | Status: DC

## 2013-08-13 MED ORDER — VANCOMYCIN HCL 1000 MG IV SOLR
INTRAVENOUS | Status: DC
  Filled 2013-08-13: qty 1000

## 2013-08-13 MED ORDER — MAGNESIUM SULFATE 50 % IJ SOLN
40.0000 meq | INTRAMUSCULAR | Status: DC
  Filled 2013-08-13: qty 10

## 2013-08-13 MED ORDER — SODIUM CHLORIDE 0.9 % IV SOLN
INTRAVENOUS | Status: AC
  Administered 2013-08-14: 1.4 [IU]/h via INTRAVENOUS
  Administered 2013-08-14: 1 [IU]/h via INTRAVENOUS
  Filled 2013-08-13: qty 1

## 2013-08-13 MED ORDER — SODIUM CHLORIDE 0.9 % IV SOLN
INTRAVENOUS | Status: DC
  Filled 2013-08-13: qty 1

## 2013-08-13 MED ORDER — SODIUM CHLORIDE 0.9 % IV SOLN
1250.0000 mg | INTRAVENOUS | Status: AC
  Administered 2013-08-14: 1250 mg via INTRAVENOUS
  Filled 2013-08-13: qty 1250

## 2013-08-13 MED ORDER — POTASSIUM CHLORIDE 2 MEQ/ML IV SOLN
80.0000 meq | INTRAVENOUS | Status: DC
  Filled 2013-08-13: qty 40

## 2013-08-13 MED ORDER — PHENYLEPHRINE HCL 10 MG/ML IJ SOLN
30.0000 ug/min | INTRAVENOUS | Status: AC
  Administered 2013-08-14: 25 ug/min via INTRAVENOUS
  Filled 2013-08-13: qty 2

## 2013-08-13 MED ORDER — VANCOMYCIN HCL 10 G IV SOLR
1250.0000 mg | INTRAVENOUS | Status: DC
  Filled 2013-08-13: qty 1250

## 2013-08-13 MED ORDER — VANCOMYCIN HCL 1000 MG IV SOLR
INTRAVENOUS | Status: AC
  Administered 2013-08-14: 09:00:00
  Filled 2013-08-13: qty 1000

## 2013-08-13 MED ORDER — NITROGLYCERIN IN D5W 200-5 MCG/ML-% IV SOLN
2.0000 ug/min | INTRAVENOUS | Status: DC
  Filled 2013-08-13: qty 250

## 2013-08-13 MED ORDER — SODIUM CHLORIDE 0.9 % IV SOLN
INTRAVENOUS | Status: DC
  Filled 2013-08-13: qty 40

## 2013-08-13 MED ORDER — DEXTROSE 5 % IV SOLN
1.5000 g | INTRAVENOUS | Status: DC
  Filled 2013-08-13: qty 1.5

## 2013-08-13 MED ORDER — DEXMEDETOMIDINE HCL IN NACL 400 MCG/100ML IV SOLN
0.1000 ug/kg/h | INTRAVENOUS | Status: DC
  Filled 2013-08-13: qty 100

## 2013-08-13 MED ORDER — DOPAMINE-DEXTROSE 3.2-5 MG/ML-% IV SOLN: 2.0000 ug/kg/min | INTRAVENOUS | Status: DC

## 2013-08-13 MED ORDER — PLASMA-LYTE 148 IV SOLN
INTRAVENOUS | Status: AC
  Administered 2013-08-14: 09:00:00
  Filled 2013-08-13: qty 2.5

## 2013-08-13 MED ORDER — HEPARIN SODIUM (PORCINE) 1000 UNIT/ML IJ SOLN
INTRAMUSCULAR | Status: DC
  Filled 2013-08-13: qty 30

## 2013-08-13 MED ORDER — DEXTROSE 5 % IV SOLN
750.0000 mg | INTRAVENOUS | Status: DC
  Filled 2013-08-13: qty 750

## 2013-08-13 MED ORDER — PHENYLEPHRINE HCL 10 MG/ML IJ SOLN
30.0000 ug/min | INTRAVENOUS | Status: DC
  Filled 2013-08-13: qty 2

## 2013-08-13 MED ORDER — CEFUROXIME SODIUM 1.5 G IJ SOLR
1.5000 g | INTRAMUSCULAR | Status: AC
  Administered 2013-08-14: .75 g via INTRAVENOUS
  Administered 2013-08-14: 1.5 g via INTRAVENOUS
  Filled 2013-08-13 (×2): qty 1.5

## 2013-08-13 MED ORDER — PLASMA-LYTE 148 IV SOLN
INTRAVENOUS | Status: DC
  Filled 2013-08-13: qty 2.5

## 2013-08-13 MED ORDER — SODIUM CHLORIDE 0.9 % IV SOLN
INTRAVENOUS | Status: DC
  Filled 2013-08-13: qty 30

## 2013-08-13 MED ORDER — EPINEPHRINE HCL 1 MG/ML IJ SOLN
0.5000 ug/min | INTRAMUSCULAR | Status: DC
  Filled 2013-08-13: qty 4

## 2013-08-13 MED ORDER — DOPAMINE-DEXTROSE 3.2-5 MG/ML-% IV SOLN
2.0000 ug/kg/min | INTRAVENOUS | Status: AC
  Administered 2013-08-14: 3 ug/kg/min via INTRAVENOUS
  Filled 2013-08-13: qty 250

## 2013-08-13 MED ORDER — SODIUM CHLORIDE 0.9 % IV SOLN
INTRAVENOUS | Status: AC
  Administered 2013-08-14: 69.8 mL/h via INTRAVENOUS
  Filled 2013-08-13: qty 40

## 2013-08-13 MED ORDER — DEXMEDETOMIDINE HCL IN NACL 400 MCG/100ML IV SOLN
0.1000 ug/kg/h | INTRAVENOUS | Status: AC
  Administered 2013-08-14: 0.2 ug/kg/h via INTRAVENOUS
  Filled 2013-08-13: qty 100

## 2013-08-13 MED ORDER — EPINEPHRINE HCL 1 MG/ML IJ SOLN
0.5000 ug/min | INTRAVENOUS | Status: DC
  Filled 2013-08-13: qty 4

## 2013-08-13 NOTE — Progress Notes (Signed)
I spoke with Daniel Mayo and confirmed arrival time of 75 on 08/14/13.

## 2013-08-14 ENCOUNTER — Inpatient Hospital Stay (HOSPITAL_COMMUNITY)
Admission: RE | Admit: 2013-08-14 | Discharge: 2013-08-19 | DRG: 220 | Disposition: A | Discharge status: Home / Self Care | Payer: Medicare PPO | Source: Ambulatory Visit | Attending: Thoracic Surgery (Cardiothoracic Vascular Surgery) | Admitting: Thoracic Surgery (Cardiothoracic Vascular Surgery)

## 2013-08-14 ENCOUNTER — Encounter (HOSPITAL_COMMUNITY): Payer: Self-pay | Admitting: *Deleted

## 2013-08-14 ENCOUNTER — Inpatient Hospital Stay (HOSPITAL_COMMUNITY): Payer: Medicare PPO | Admitting: Certified Registered"

## 2013-08-14 ENCOUNTER — Inpatient Hospital Stay (HOSPITAL_COMMUNITY): Payer: Medicare PPO

## 2013-08-14 ENCOUNTER — Encounter (HOSPITAL_COMMUNITY): Payer: Medicare PPO | Admitting: Certified Registered"

## 2013-08-14 ENCOUNTER — Encounter (HOSPITAL_COMMUNITY)
Admission: RE | Disposition: A | Discharge status: Home / Self Care | Payer: Medicare PPO | Source: Ambulatory Visit | Attending: Thoracic Surgery (Cardiothoracic Vascular Surgery)

## 2013-08-14 DIAGNOSIS — E785 Hyperlipidemia, unspecified: Secondary | ICD-10-CM | POA: Diagnosis present

## 2013-08-14 DIAGNOSIS — I2510 Atherosclerotic heart disease of native coronary artery without angina pectoris: Secondary | ICD-10-CM | POA: Diagnosis present

## 2013-08-14 DIAGNOSIS — Z8679 Personal history of other diseases of the circulatory system: Secondary | ICD-10-CM

## 2013-08-14 DIAGNOSIS — Z981 Arthrodesis status: Secondary | ICD-10-CM

## 2013-08-14 DIAGNOSIS — G8929 Other chronic pain: Secondary | ICD-10-CM | POA: Diagnosis present

## 2013-08-14 DIAGNOSIS — Z833 Family history of diabetes mellitus: Secondary | ICD-10-CM

## 2013-08-14 DIAGNOSIS — D62 Acute posthemorrhagic anemia: Secondary | ICD-10-CM | POA: Diagnosis not present

## 2013-08-14 DIAGNOSIS — G4700 Insomnia, unspecified: Secondary | ICD-10-CM | POA: Diagnosis present

## 2013-08-14 DIAGNOSIS — I071 Rheumatic tricuspid insufficiency: Secondary | ICD-10-CM | POA: Diagnosis present

## 2013-08-14 DIAGNOSIS — N189 Chronic kidney disease, unspecified: Secondary | ICD-10-CM | POA: Diagnosis present

## 2013-08-14 DIAGNOSIS — K219 Gastro-esophageal reflux disease without esophagitis: Secondary | ICD-10-CM | POA: Diagnosis present

## 2013-08-14 DIAGNOSIS — I4891 Unspecified atrial fibrillation: Secondary | ICD-10-CM | POA: Diagnosis present

## 2013-08-14 DIAGNOSIS — R110 Nausea: Secondary | ICD-10-CM | POA: Diagnosis not present

## 2013-08-14 DIAGNOSIS — N281 Cyst of kidney, acquired: Secondary | ICD-10-CM | POA: Diagnosis present

## 2013-08-14 DIAGNOSIS — K5900 Constipation, unspecified: Secondary | ICD-10-CM | POA: Diagnosis not present

## 2013-08-14 DIAGNOSIS — I059 Rheumatic mitral valve disease, unspecified: Principal | ICD-10-CM | POA: Diagnosis present

## 2013-08-14 DIAGNOSIS — Z8249 Family history of ischemic heart disease and other diseases of the circulatory system: Secondary | ICD-10-CM

## 2013-08-14 DIAGNOSIS — I129 Hypertensive chronic kidney disease with stage 1 through stage 4 chronic kidney disease, or unspecified chronic kidney disease: Secondary | ICD-10-CM | POA: Diagnosis present

## 2013-08-14 DIAGNOSIS — E8779 Other fluid overload: Secondary | ICD-10-CM | POA: Diagnosis not present

## 2013-08-14 DIAGNOSIS — I440 Atrioventricular block, first degree: Secondary | ICD-10-CM | POA: Diagnosis present

## 2013-08-14 DIAGNOSIS — Z23 Encounter for immunization: Secondary | ICD-10-CM

## 2013-08-14 DIAGNOSIS — I079 Rheumatic tricuspid valve disease, unspecified: Secondary | ICD-10-CM | POA: Diagnosis present

## 2013-08-14 DIAGNOSIS — I10 Essential (primary) hypertension: Secondary | ICD-10-CM | POA: Diagnosis present

## 2013-08-14 DIAGNOSIS — K5730 Diverticulosis of large intestine without perforation or abscess without bleeding: Secondary | ICD-10-CM | POA: Diagnosis present

## 2013-08-14 DIAGNOSIS — R0602 Shortness of breath: Secondary | ICD-10-CM | POA: Diagnosis present

## 2013-08-14 DIAGNOSIS — Z7982 Long term (current) use of aspirin: Secondary | ICD-10-CM

## 2013-08-14 DIAGNOSIS — Z7901 Long term (current) use of anticoagulants: Secondary | ICD-10-CM

## 2013-08-14 DIAGNOSIS — Z87891 Personal history of nicotine dependence: Secondary | ICD-10-CM

## 2013-08-14 DIAGNOSIS — Z79899 Other long term (current) drug therapy: Secondary | ICD-10-CM

## 2013-08-14 DIAGNOSIS — I340 Nonrheumatic mitral (valve) insufficiency: Secondary | ICD-10-CM | POA: Diagnosis present

## 2013-08-14 DIAGNOSIS — H9190 Unspecified hearing loss, unspecified ear: Secondary | ICD-10-CM | POA: Diagnosis present

## 2013-08-14 DIAGNOSIS — D696 Thrombocytopenia, unspecified: Secondary | ICD-10-CM | POA: Diagnosis not present

## 2013-08-14 DIAGNOSIS — I498 Other specified cardiac arrhythmias: Secondary | ICD-10-CM | POA: Diagnosis not present

## 2013-08-14 DIAGNOSIS — Z9889 Other specified postprocedural states: Secondary | ICD-10-CM

## 2013-08-14 HISTORY — DX: Other specified postprocedural states: Z98.890

## 2013-08-14 HISTORY — DX: Personal history of other diseases of the circulatory system: Z86.79

## 2013-08-14 HISTORY — PX: MITRAL VALVE REPLACEMENT: SHX147

## 2013-08-14 HISTORY — PX: INTRAOPERATIVE TRANSESOPHAGEAL ECHOCARDIOGRAM: SHX5062

## 2013-08-14 HISTORY — PX: MINIMALLY INVASIVE MAZE PROCEDURE: SHX6244

## 2013-08-14 LAB — POCT I-STAT 4, (NA,K, GLUC, HGB,HCT)
Glucose, Bld: 106 mg/dL — ABNORMAL HIGH (ref 70–99)
Glucose, Bld: 117 mg/dL — ABNORMAL HIGH (ref 70–99)
Glucose, Bld: 111 mg/dL — ABNORMAL HIGH (ref 70–99)
Glucose, Bld: 113 mg/dL — ABNORMAL HIGH (ref 70–99)
Glucose, Bld: 155 mg/dL — ABNORMAL HIGH (ref 70–99)
HCT: 36 % — ABNORMAL LOW (ref 39.0–52.0)
HCT: 36 % — ABNORMAL LOW (ref 39.0–52.0)
HCT: 28 % — ABNORMAL LOW (ref 39.0–52.0)
HCT: 26 % — ABNORMAL LOW (ref 39.0–52.0)
HCT: 27 % — ABNORMAL LOW (ref 39.0–52.0)
HCT: 34 % — ABNORMAL LOW (ref 39.0–52.0)
Hemoglobin: 12.2 g/dL — ABNORMAL LOW (ref 13.0–17.0)
Hemoglobin: 12.2 g/dL — ABNORMAL LOW (ref 13.0–17.0)
Hemoglobin: 9.9 g/dL — ABNORMAL LOW (ref 13.0–17.0)
Hemoglobin: 9.5 g/dL — ABNORMAL LOW (ref 13.0–17.0)
Hemoglobin: 8.8 g/dL — ABNORMAL LOW (ref 13.0–17.0)
Hemoglobin: 9.2 g/dL — ABNORMAL LOW (ref 13.0–17.0)
Potassium: 4.2 meq/L (ref 3.5–5.1)
Potassium: 4.4 meq/L (ref 3.5–5.1)
Potassium: 5.8 meq/L — ABNORMAL HIGH (ref 3.5–5.1)
Potassium: 6 meq/L — ABNORMAL HIGH (ref 3.5–5.1)
Potassium: 4.4 meq/L (ref 3.5–5.1)
Sodium: 142 meq/L (ref 135–145)
Sodium: 141 meq/L (ref 135–145)
Sodium: 137 meq/L (ref 135–145)
Sodium: 138 meq/L (ref 135–145)
Sodium: 142 meq/L (ref 135–145)

## 2013-08-14 LAB — POCT I-STAT 3, ART BLOOD GAS (G3+)
Acid-base deficit: 2 mmol/L (ref 0.0–2.0)
Acid-base deficit: 5 mmol/L — ABNORMAL HIGH (ref 0.0–2.0)
Acid-base deficit: 4 mmol/L — ABNORMAL HIGH (ref 0.0–2.0)
Bicarbonate: 23.8 meq/L (ref 20.0–24.0)
O2 Saturation: 100 %
O2 Saturation: 100 %
O2 Saturation: 90 %
TCO2: 25 mmol/L (ref 0–100)
TCO2: 24 mmol/L (ref 0–100)
pCO2 arterial: 46.2 mmHg — ABNORMAL HIGH (ref 35.0–45.0)
pCO2 arterial: 37.7 mmHg (ref 35.0–45.0)
pCO2 arterial: 41.2 mmHg (ref 35.0–45.0)
pH, Arterial: 7.319 — ABNORMAL LOW (ref 7.350–7.450)
pH, Arterial: 7.334 — ABNORMAL LOW (ref 7.350–7.450)
pH, Arterial: 7.335 — ABNORMAL LOW (ref 7.350–7.450)
pO2, Arterial: 394 mmHg — ABNORMAL HIGH (ref 80.0–100.0)
pO2, Arterial: 182 mmHg — ABNORMAL HIGH (ref 80.0–100.0)
pO2, Arterial: 59 mmHg — ABNORMAL LOW (ref 80.0–100.0)

## 2013-08-14 LAB — CREATININE, SERUM
Creatinine, Ser: 1.28 mg/dL (ref 0.50–1.35)
GFR calc Af Amer: 61 mL/min — ABNORMAL LOW (ref >90)
GFR calc non Af Amer: 53 mL/min — ABNORMAL LOW (ref >90)

## 2013-08-14 LAB — CBC
HCT: 36.2 % — ABNORMAL LOW (ref 39.0–52.0)
HCT: 34 % — ABNORMAL LOW (ref 39.0–52.0)
Hemoglobin: 12.9 g/dL — ABNORMAL LOW (ref 13.0–17.0)
Hemoglobin: 12.2 g/dL — ABNORMAL LOW (ref 13.0–17.0)
MCV: 93.9 fL (ref 78.0–100.0)
Platelets: 113 10*3/uL — ABNORMAL LOW (ref 150–400)
RBC: 3.85 MIL/uL — ABNORMAL LOW (ref 4.22–5.81)
RBC: 3.62 MIL/uL — ABNORMAL LOW (ref 4.22–5.81)
WBC: 11.8 10*3/uL — ABNORMAL HIGH (ref 4.0–10.5)
WBC: 11.8 10*3/uL — ABNORMAL HIGH (ref 4.0–10.5)

## 2013-08-14 LAB — HEMOGLOBIN AND HEMATOCRIT, BLOOD
HCT: 27.5 % — ABNORMAL LOW (ref 39.0–52.0)
Hemoglobin: 9.9 g/dL — ABNORMAL LOW (ref 13.0–17.0)

## 2013-08-14 LAB — APTT: aPTT: 31 seconds (ref 24–37)

## 2013-08-14 LAB — POCT I-STAT GLUCOSE
Glucose, Bld: 113 mg/dL — ABNORMAL HIGH (ref 70–99)
Operator id: 284731

## 2013-08-14 LAB — PROTIME-INR: Prothrombin Time: 16.3 seconds — ABNORMAL HIGH (ref 11.6–15.2)

## 2013-08-14 LAB — PLATELET COUNT: Platelets: 53 10*3/uL — ABNORMAL LOW (ref 150–400)

## 2013-08-14 SURGERY — REPLACEMENT, MITRAL VALVE, MINIMALLY INVASIVE
Anesthesia: General | Site: Chest | Laterality: Right | Wound class: Clean

## 2013-08-14 MED ORDER — MORPHINE SULFATE 2 MG/ML IJ SOLN
2.0000 mg | INTRAMUSCULAR | Status: DC | PRN
  Administered 2013-08-15: 2 mg via INTRAVENOUS
  Administered 2013-08-15: 4 mg via INTRAVENOUS
  Administered 2013-08-15 (×2): 2 mg via INTRAVENOUS
  Administered 2013-08-15 – 2013-08-16 (×3): 4 mg via INTRAVENOUS
  Administered 2013-08-16 (×2): 2 mg via INTRAVENOUS
  Filled 2013-08-14 (×3): qty 1
  Filled 2013-08-14: qty 2
  Filled 2013-08-14: qty 1
  Filled 2013-08-14 (×2): qty 2
  Filled 2013-08-14 (×2): qty 1
  Filled 2013-08-14 (×2): qty 2

## 2013-08-14 MED ORDER — SODIUM CHLORIDE 0.9 % IV SOLN
INTRAVENOUS | Status: DC
  Administered 2013-08-14: 23:00:00 via INTRAVENOUS
  Administered 2013-08-15: 2 [IU]/h via INTRAVENOUS
  Filled 2013-08-14 (×2): qty 1

## 2013-08-14 MED ORDER — MAGNESIUM SULFATE 40 MG/ML IJ SOLN
4.0000 g | Freq: Once | INTRAMUSCULAR | Status: AC
  Administered 2013-08-14: 4 g via INTRAVENOUS
  Filled 2013-08-14: qty 100

## 2013-08-14 MED ORDER — PANTOPRAZOLE SODIUM 40 MG PO TBEC
40.0000 mg | DELAYED_RELEASE_TABLET | Freq: Every day | ORAL | Status: DC
  Administered 2013-08-17: 40 mg via ORAL
  Filled 2013-08-14 (×3): qty 1

## 2013-08-14 MED ORDER — METOPROLOL TARTRATE 12.5 MG HALF TABLET
ORAL_TABLET | ORAL | Status: AC
  Filled 2013-08-14: qty 1

## 2013-08-14 MED ORDER — BISACODYL 5 MG PO TBEC
10.0000 mg | DELAYED_RELEASE_TABLET | Freq: Every day | ORAL | Status: DC
  Administered 2013-08-15 – 2013-08-17 (×3): 10 mg via ORAL
  Filled 2013-08-14 (×3): qty 2

## 2013-08-14 MED ORDER — SODIUM CHLORIDE 0.9 % IV SOLN
250.0000 mL | INTRAVENOUS | Status: DC
Start: 2013-08-15 — End: 2013-08-17

## 2013-08-14 MED ORDER — LACTATED RINGERS IV SOLN: 500.0000 mL | Freq: Once | INTRAVENOUS | Status: AC | PRN

## 2013-08-14 MED ORDER — METOPROLOL TARTRATE 1 MG/ML IV SOLN: 2.5000 mg | INTRAVENOUS | Status: DC | PRN

## 2013-08-14 MED ORDER — ONDANSETRON HCL 4 MG/2ML IJ SOLN
4.0000 mg | Freq: Four times a day (QID) | INTRAMUSCULAR | Status: DC | PRN
  Administered 2013-08-15 (×2): 4 mg via INTRAVENOUS
  Filled 2013-08-14 (×3): qty 2

## 2013-08-14 MED ORDER — SODIUM CHLORIDE 0.9 % IV SOLN
INTRAVENOUS | Status: AC
  Administered 2013-08-14: 100 mL/h via INTRAVENOUS

## 2013-08-14 MED ORDER — DOCUSATE SODIUM 100 MG PO CAPS
200.0000 mg | ORAL_CAPSULE | Freq: Every day | ORAL | Status: DC
  Administered 2013-08-15 – 2013-08-17 (×3): 200 mg via ORAL
  Filled 2013-08-14 (×3): qty 2

## 2013-08-14 MED ORDER — PHENYLEPHRINE HCL 10 MG/ML IJ SOLN
0.0000 ug/min | INTRAVENOUS | Status: DC
  Filled 2013-08-14: qty 2

## 2013-08-14 MED ORDER — POTASSIUM CHLORIDE 10 MEQ/50ML IV SOLN
10.0000 meq | INTRAVENOUS | Status: AC
  Administered 2013-08-14 (×2): 10 meq via INTRAVENOUS

## 2013-08-14 MED ORDER — ASPIRIN 81 MG PO CHEW: 324.0000 mg | CHEWABLE_TABLET | Freq: Every day | ORAL | Status: DC

## 2013-08-14 MED ORDER — ACETAMINOPHEN 160 MG/5ML PO SOLN: 650.0000 mg | Freq: Once | ORAL | Status: AC

## 2013-08-14 MED ORDER — PROTAMINE SULFATE 10 MG/ML IV SOLN
INTRAVENOUS | Status: DC | PRN
  Administered 2013-08-14 (×6): 50 mg via INTRAVENOUS

## 2013-08-14 MED ORDER — MIDAZOLAM HCL 5 MG/5ML IJ SOLN
INTRAMUSCULAR | Status: DC | PRN
  Administered 2013-08-14: 2 mg via INTRAVENOUS
  Administered 2013-08-14 (×2): 3 mg via INTRAVENOUS
  Administered 2013-08-14: 1 mg via INTRAVENOUS
  Administered 2013-08-14: 2 mg via INTRAVENOUS
  Administered 2013-08-14: 1 mg via INTRAVENOUS
  Administered 2013-08-14: 2 mg via INTRAVENOUS

## 2013-08-14 MED ORDER — METOPROLOL TARTRATE 12.5 MG HALF TABLET
12.5000 mg | ORAL_TABLET | Freq: Once | ORAL | Status: AC
  Administered 2013-08-14: 12.5 mg via ORAL

## 2013-08-14 MED ORDER — ARTIFICIAL TEARS OP OINT
TOPICAL_OINTMENT | OPHTHALMIC | Status: DC | PRN
  Administered 2013-08-14: 1 via OPHTHALMIC

## 2013-08-14 MED ORDER — SODIUM CHLORIDE 0.9 % IR SOLN
Status: DC | PRN
  Administered 2013-08-14: 6000 mL

## 2013-08-14 MED ORDER — ASPIRIN EC 325 MG PO TBEC
325.0000 mg | DELAYED_RELEASE_TABLET | Freq: Every day | ORAL | Status: DC
  Administered 2013-08-15: 325 mg via ORAL
  Filled 2013-08-14 (×2): qty 1

## 2013-08-14 MED ORDER — CHLORHEXIDINE GLUCONATE CLOTH 2 % EX PADS
6.0000 | MEDICATED_PAD | Freq: Every day | CUTANEOUS | Status: DC
  Administered 2013-08-15 – 2013-08-17 (×3): 6 via TOPICAL

## 2013-08-14 MED ORDER — DEXMEDETOMIDINE HCL IN NACL 200 MCG/50ML IV SOLN
0.1000 ug/kg/h | INTRAVENOUS | Status: DC
  Administered 2013-08-14: 0.4 ug/kg/h via INTRAVENOUS
  Filled 2013-08-14: qty 50

## 2013-08-14 MED ORDER — SODIUM CHLORIDE 0.9 % IJ SOLN
3.0000 mL | Freq: Two times a day (BID) | INTRAMUSCULAR | Status: DC
  Administered 2013-08-15 – 2013-08-17 (×5): 3 mL via INTRAVENOUS

## 2013-08-14 MED ORDER — SODIUM BICARBONATE 8.4 % IV SOLN
INTRAVENOUS | Status: DC | PRN
  Administered 2013-08-14: 50 meq via INTRAVENOUS

## 2013-08-14 MED ORDER — SODIUM CHLORIDE 0.45 % IV SOLN
INTRAVENOUS | Status: DC
  Administered 2013-08-14: 20 mL/h via INTRAVENOUS

## 2013-08-14 MED ORDER — ACETAMINOPHEN 650 MG RE SUPP
650.0000 mg | Freq: Once | RECTAL | Status: AC
  Administered 2013-08-14: 650 mg via RECTAL

## 2013-08-14 MED ORDER — INSULIN REGULAR BOLUS VIA INFUSION
0.0000 [IU] | Freq: Three times a day (TID) | INTRAVENOUS | Status: DC
  Administered 2013-08-15: 4 [IU] via INTRAVENOUS
  Filled 2013-08-14: qty 10

## 2013-08-14 MED ORDER — SODIUM CHLORIDE 0.9 % IJ SOLN
3.0000 mL | INTRAMUSCULAR | Status: DC | PRN
  Administered 2013-08-14 – 2013-08-15 (×2): 3 mL via INTRAVENOUS

## 2013-08-14 MED ORDER — DOPAMINE-DEXTROSE 3.2-5 MG/ML-% IV SOLN
3.0000 ug/kg/min | INTRAVENOUS | Status: DC
  Administered 2013-08-16: 3 ug/kg/min via INTRAVENOUS
  Filled 2013-08-14: qty 250

## 2013-08-14 MED ORDER — OXYCODONE HCL 5 MG PO TABS
5.0000 mg | ORAL_TABLET | ORAL | Status: DC | PRN
  Administered 2013-08-15: 5 mg via ORAL
  Administered 2013-08-15 – 2013-08-16 (×3): 10 mg via ORAL
  Filled 2013-08-14: qty 2
  Filled 2013-08-14: qty 1
  Filled 2013-08-14: qty 2
  Filled 2013-08-14: qty 1
  Filled 2013-08-14: qty 2

## 2013-08-14 MED ORDER — VANCOMYCIN HCL IN DEXTROSE 1-5 GM/200ML-% IV SOLN
1000.0000 mg | Freq: Once | INTRAVENOUS | Status: AC
  Administered 2013-08-14: 1000 mg via INTRAVENOUS
  Filled 2013-08-14: qty 200

## 2013-08-14 MED ORDER — DEXTROSE 5 % IV SOLN
1.5000 g | Freq: Two times a day (BID) | INTRAVENOUS | Status: AC
  Administered 2013-08-14 – 2013-08-16 (×4): 1.5 g via INTRAVENOUS
  Filled 2013-08-14 (×4): qty 1.5

## 2013-08-14 MED ORDER — MUPIROCIN 2 % EX OINT
1.0000 "application " | TOPICAL_OINTMENT | Freq: Two times a day (BID) | CUTANEOUS | Status: DC
  Administered 2013-08-14 – 2013-08-19 (×9): 1 via NASAL
  Filled 2013-08-14 (×2): qty 22

## 2013-08-14 MED ORDER — ACETAMINOPHEN 500 MG PO TABS
1000.0000 mg | ORAL_TABLET | Freq: Four times a day (QID) | ORAL | Status: DC
  Administered 2013-08-15 – 2013-08-17 (×7): 1000 mg via ORAL
  Filled 2013-08-14 (×13): qty 2

## 2013-08-14 MED ORDER — FENTANYL CITRATE 0.05 MG/ML IJ SOLN
INTRAMUSCULAR | Status: DC | PRN
  Administered 2013-08-14: 150 ug via INTRAVENOUS
  Administered 2013-08-14: 50 ug via INTRAVENOUS
  Administered 2013-08-14: 150 ug via INTRAVENOUS
  Administered 2013-08-14: 250 ug via INTRAVENOUS
  Administered 2013-08-14 (×2): 100 ug via INTRAVENOUS
  Administered 2013-08-14: 350 ug via INTRAVENOUS
  Administered 2013-08-14: 100 ug via INTRAVENOUS

## 2013-08-14 MED ORDER — NITROGLYCERIN IN D5W 200-5 MCG/ML-% IV SOLN: 0.0000 ug/min | INTRAVENOUS | Status: DC

## 2013-08-14 MED ORDER — BISACODYL 10 MG RE SUPP: 10.0000 mg | Freq: Every day | RECTAL | Status: DC

## 2013-08-14 MED ORDER — LACTATED RINGERS IV SOLN
INTRAVENOUS | Status: DC | PRN
  Administered 2013-08-14 (×4): via INTRAVENOUS

## 2013-08-14 MED ORDER — METOPROLOL TARTRATE 25 MG/10 ML ORAL SUSPENSION
12.5000 mg | Freq: Two times a day (BID) | ORAL | Status: DC
  Administered 2013-08-14: 12.5 mg
  Filled 2013-08-14 (×3): qty 5

## 2013-08-14 MED ORDER — PROPOFOL 10 MG/ML IV BOLUS
INTRAVENOUS | Status: DC | PRN
  Administered 2013-08-14: 80 mg via INTRAVENOUS

## 2013-08-14 MED ORDER — FAMOTIDINE IN NACL 20-0.9 MG/50ML-% IV SOLN
20.0000 mg | Freq: Two times a day (BID) | INTRAVENOUS | Status: AC
  Administered 2013-08-14 (×2): 20 mg via INTRAVENOUS
  Filled 2013-08-14: qty 50

## 2013-08-14 MED ORDER — VECURONIUM BROMIDE 10 MG IV SOLR
INTRAVENOUS | Status: DC | PRN
  Administered 2013-08-14 (×2): 5 mg via INTRAVENOUS
  Administered 2013-08-14 (×2): 3 mg via INTRAVENOUS

## 2013-08-14 MED ORDER — MIDAZOLAM HCL 2 MG/2ML IJ SOLN: 2.0000 mg | INTRAMUSCULAR | Status: DC | PRN

## 2013-08-14 MED ORDER — MORPHINE SULFATE 2 MG/ML IJ SOLN: 1.0000 mg | INTRAMUSCULAR | Status: AC | PRN

## 2013-08-14 MED ORDER — SODIUM CHLORIDE 0.9 % IV SOLN
INTRAVENOUS | Status: DC | PRN
  Administered 2013-08-14: 14:00:00 via INTRAVENOUS

## 2013-08-14 MED ORDER — MUPIROCIN 2 % EX OINT: TOPICAL_OINTMENT | Freq: Two times a day (BID) | CUTANEOUS | Status: DC

## 2013-08-14 MED ORDER — LACTATED RINGERS IV SOLN: INTRAVENOUS | Status: DC

## 2013-08-14 MED ORDER — SODIUM CHLORIDE 0.9 % IV SOLN: INTRAVENOUS | Status: DC

## 2013-08-14 MED ORDER — CHLORHEXIDINE GLUCONATE 4 % EX LIQD: 30.0000 mL | CUTANEOUS | Status: DC

## 2013-08-14 MED ORDER — SODIUM CHLORIDE 0.9 % IV SOLN
1.0000 g/h | INTRAVENOUS | Status: DC
  Filled 2013-08-14: qty 20

## 2013-08-14 MED ORDER — HEPARIN SODIUM (PORCINE) 1000 UNIT/ML IJ SOLN
INTRAMUSCULAR | Status: DC | PRN
  Administered 2013-08-14: 33000 [IU] via INTRAVENOUS

## 2013-08-14 MED ORDER — FLECAINIDE ACETATE 50 MG PO TABS
50.0000 mg | ORAL_TABLET | Freq: Every day | ORAL | Status: DC
  Administered 2013-08-15 – 2013-08-19 (×5): 50 mg via ORAL
  Filled 2013-08-14 (×6): qty 1

## 2013-08-14 MED ORDER — ACETAMINOPHEN 160 MG/5ML PO SOLN: 1000.0000 mg | Freq: Four times a day (QID) | ORAL | Status: DC

## 2013-08-14 MED ORDER — METOPROLOL TARTRATE 12.5 MG HALF TABLET
12.5000 mg | ORAL_TABLET | Freq: Two times a day (BID) | ORAL | Status: DC
  Filled 2013-08-14 (×3): qty 1

## 2013-08-14 MED ORDER — ROCURONIUM BROMIDE 100 MG/10ML IV SOLN
INTRAVENOUS | Status: DC | PRN
  Administered 2013-08-14 (×2): 50 mg via INTRAVENOUS

## 2013-08-14 MED ORDER — ALBUMIN HUMAN 5 % IV SOLN
250.0000 mL | INTRAVENOUS | Status: AC | PRN
  Administered 2013-08-14: 250 mL via INTRAVENOUS

## 2013-08-14 MED ORDER — MILRINONE IN DEXTROSE 20 MG/100ML IV SOLN
0.2500 ug/kg/min | INTRAVENOUS | Status: AC
  Administered 2013-08-14: .3 ug/kg/min via INTRAVENOUS
  Filled 2013-08-14: qty 100

## 2013-08-14 MED ORDER — MILRINONE IN DEXTROSE 20 MG/100ML IV SOLN
0.3000 ug/kg/min | INTRAVENOUS | Status: DC
  Administered 2013-08-14: 0.3 ug/kg/min via INTRAVENOUS
  Filled 2013-08-14: qty 100

## 2013-08-14 MED ORDER — SODIUM CHLORIDE 0.9 % IV SOLN: 1250.0000 mg | INTRAVENOUS | Status: AC

## 2013-08-14 SURGICAL SUPPLY — 146 items
ADAPTER CARDIO PERF ANTE/RETRO (ADAPTER) ×4 IMPLANT
APPLICATOR COTTON TIP 6IN STRL (MISCELLANEOUS) IMPLANT
ATTRACTOMAT 16X20 MAGNETIC DRP (DRAPES) ×4 IMPLANT
BAG DECANTER FOR FLEXI CONT (MISCELLANEOUS) ×8 IMPLANT
BENZOIN TINCTURE PRP APPL 2/3 (GAUZE/BANDAGES/DRESSINGS) ×4 IMPLANT
BLADE STERNUM SYSTEM 6 (BLADE) IMPLANT
BLADE SURG 11 STRL SS (BLADE) ×4 IMPLANT
CANISTER SUCTION 2500CC (MISCELLANEOUS) ×8 IMPLANT
CANN PRFSN 3/8X14X24FR PCFC (MISCELLANEOUS)
CANN PRFSN 3/8XCNCT ST RT ANG (MISCELLANEOUS)
CANNULA FEM VENOUS REMOTE 22FR (CANNULA) ×4 IMPLANT
CANNULA FEMORAL ART 14 SM (MISCELLANEOUS) ×4 IMPLANT
CANNULA GUNDRY RCSP 15FR (MISCELLANEOUS) ×4 IMPLANT
CANNULA OPTISITE PERFUSION 16F (CANNULA) IMPLANT
CANNULA OPTISITE PERFUSION 18F (CANNULA) ×4 IMPLANT
CANNULA PRFSN 3/8X14X24FR PCFC (MISCELLANEOUS) IMPLANT
CANNULA PRFSN 3/8XCNCT RT ANG (MISCELLANEOUS) IMPLANT
CANNULA VEN MTL TIP RT (MISCELLANEOUS)
CARDIOBLATE CARDIAC ABLATION (MISCELLANEOUS) ×4
CATH ROBINSON RED A/P 18FR (CATHETERS) IMPLANT
CATH THORACIC 28FR RT ANG (CATHETERS) IMPLANT
CATH THORACIC 36FR (CATHETERS) IMPLANT
CLAMP ISOLATOR SYNERGY LG (MISCELLANEOUS) ×4 IMPLANT
CLIP FOGARTY SPRING 6M (CLIP) IMPLANT
CONN 1/2X1/2X1/2  BEN (MISCELLANEOUS) ×1
CONN 1/2X1/2X1/2 BEN (MISCELLANEOUS) ×3 IMPLANT
CONN 3/8X1/2 ST GISH (MISCELLANEOUS) ×8 IMPLANT
CONN ST 1/4X3/8  BEN (MISCELLANEOUS) ×2
CONN ST 1/4X3/8 BEN (MISCELLANEOUS) ×6 IMPLANT
CONT SPEC STER OR (MISCELLANEOUS) ×4 IMPLANT
COVER BACK TABLE 24X17X13 BIG (DRAPES) ×4 IMPLANT
COVER MAYO STAND STRL (DRAPES) ×4 IMPLANT
COVER PROBE W GEL 5X96 (DRAPES) ×4 IMPLANT
COVER SURGICAL LIGHT HANDLE (MISCELLANEOUS) ×4 IMPLANT
CRADLE DONUT ADULT HEAD (MISCELLANEOUS) ×4 IMPLANT
DERMABOND ADVANCED (GAUZE/BANDAGES/DRESSINGS) ×2
DERMABOND ADVANCED .7 DNX12 (GAUZE/BANDAGES/DRESSINGS) ×6 IMPLANT
DEVICE CARDIOBLATE CARDIAC ABL (MISCELLANEOUS) ×3 IMPLANT
DEVICE SUT CK QUICK LOAD MINI (Prosthesis & Implant Heart) ×16 IMPLANT
DEVICE TROCAR PUNCTURE CLOSURE (ENDOMECHANICALS) ×8 IMPLANT
DRAIN CHANNEL 28F RND 3/8 FF (WOUND CARE) ×8 IMPLANT
DRAIN CHANNEL 32F RND 10.7 FF (WOUND CARE) IMPLANT
DRAPE BILATERAL SPLIT (DRAPES) ×4 IMPLANT
DRAPE C-ARM 42X72 X-RAY (DRAPES) ×4 IMPLANT
DRAPE CARDIOVASCULAR INCISE (DRAPES)
DRAPE CV SPLIT W-CLR ANES SCRN (DRAPES) ×4 IMPLANT
DRAPE INCISE IOBAN 66X45 STRL (DRAPES) ×8 IMPLANT
DRAPE SLUSH/WARMER DISC (DRAPES) ×4 IMPLANT
DRAPE SRG 135X102X78XABS (DRAPES) IMPLANT
DRSG COVADERM 4X14 (GAUZE/BANDAGES/DRESSINGS) IMPLANT
DRSG COVADERM 4X8 (GAUZE/BANDAGES/DRESSINGS) ×4 IMPLANT
ELECT BLADE 6.5 EXT (BLADE) ×4 IMPLANT
ELECT REM PT RETURN 9FT ADLT (ELECTROSURGICAL) ×8
ELECTRODE REM PT RTRN 9FT ADLT (ELECTROSURGICAL) ×6 IMPLANT
FEMORAL VENOUS CANN RAP (CANNULA) IMPLANT
GLOVE BIO SURGEON STRL SZ 6 (GLOVE) ×16 IMPLANT
GLOVE BIO SURGEON STRL SZ 6.5 (GLOVE) ×20 IMPLANT
GLOVE BIO SURGEON STRL SZ7 (GLOVE) ×8 IMPLANT
GLOVE BIO SURGEON STRL SZ7.5 (GLOVE) IMPLANT
GLOVE BIOGEL PI IND STRL 6 (GLOVE) ×9 IMPLANT
GLOVE BIOGEL PI IND STRL 6.5 (GLOVE) ×15 IMPLANT
GLOVE BIOGEL PI IND STRL 7.0 (GLOVE) ×12 IMPLANT
GLOVE BIOGEL PI INDICATOR 6 (GLOVE) ×3
GLOVE BIOGEL PI INDICATOR 6.5 (GLOVE) ×5
GLOVE BIOGEL PI INDICATOR 7.0 (GLOVE) ×4
GLOVE ORTHO TXT STRL SZ7.5 (GLOVE) ×12 IMPLANT
GOWN STRL NON-REIN LRG LVL3 (GOWN DISPOSABLE) ×32 IMPLANT
GUIDEWIRE ANG ZIPWIRE 038X150 (WIRE) IMPLANT
HEMOSTAT POWDER SURGIFOAM 1G (HEMOSTASIS) IMPLANT
INSERT CONFORM CROSS CLAMP 66M (MISCELLANEOUS) IMPLANT
INSERT CONFORM CROSS CLAMP 86M (MISCELLANEOUS) IMPLANT
INSERT FOGARTY XLG (MISCELLANEOUS) IMPLANT
KIT BASIN OR (CUSTOM PROCEDURE TRAY) ×4 IMPLANT
KIT DEVICE SUT COR-KNOT MIS 5 (INSTRUMENTS) ×4 IMPLANT
KIT DILATOR VASC 18G NDL (KITS) ×4 IMPLANT
KIT DRAINAGE VACCUM ASSIST (KITS) ×4 IMPLANT
KIT ROOM TURNOVER OR (KITS) ×4 IMPLANT
KIT SUCTION CATH 14FR (SUCTIONS) ×8 IMPLANT
LEAD PACING MYOCARDI (MISCELLANEOUS) ×4 IMPLANT
LINE VENT (MISCELLANEOUS) ×4 IMPLANT
MARKER GRAFT CORONARY BYPASS (MISCELLANEOUS) IMPLANT
NEEDLE AORTIC ROOT 14G 7F (CATHETERS) ×4 IMPLANT
NS IRRIG 1000ML POUR BTL (IV SOLUTION) ×32 IMPLANT
PACK OPEN HEART (CUSTOM PROCEDURE TRAY) ×4 IMPLANT
PAD ARMBOARD 7.5X6 YLW CONV (MISCELLANEOUS) ×8 IMPLANT
PAD ELECT DEFIB RADIOL ZOLL (MISCELLANEOUS) ×4 IMPLANT
PATCH CORMATRIX 4CMX7CM (Prosthesis & Implant Heart) ×4 IMPLANT
PROBE CRYO2-ABLATION MALLABLE (MISCELLANEOUS) ×4 IMPLANT
RETRACTOR PVM SOFT TISSUE M (INSTRUMENTS) ×4 IMPLANT
RETRACTOR TRL SOFT TISSUE LG (INSTRUMENTS) IMPLANT
RETRACTOR TRM SOFT TISSUE 7.5 (INSTRUMENTS) IMPLANT
RING MITRAL MEMO 3D 28MM SMD28 (Prosthesis & Implant Heart) ×4 IMPLANT
SET CANNULATION TOURNIQUET (MISCELLANEOUS) ×4 IMPLANT
SET CARDIOPLEGIA MPS 5001102 (MISCELLANEOUS) ×4 IMPLANT
SET IRRIG TUBING LAPAROSCOPIC (IRRIGATION / IRRIGATOR) ×4 IMPLANT
SOLUTION ANTI FOG 6CC (MISCELLANEOUS) ×8 IMPLANT
SPONGE GAUZE 4X4 12PLY (GAUZE/BANDAGES/DRESSINGS) ×4 IMPLANT
SUCKER INTRACARDIAC WEIGHTED (SUCKER) IMPLANT
SUCKER WEIGHTED FLEX (MISCELLANEOUS) ×8 IMPLANT
SUT BONE WAX W31G (SUTURE) ×8 IMPLANT
SUT E-PACK MINIMALLY INVASIVE (SUTURE) ×4 IMPLANT
SUT ETHIBOND (SUTURE) ×8 IMPLANT
SUT ETHIBOND 2 0 SH (SUTURE) ×4 IMPLANT
SUT ETHIBOND 2 0 SH 36X2 (SUTURE) IMPLANT
SUT ETHIBOND 2 0 V4 (SUTURE) IMPLANT
SUT ETHIBOND 2 0V4 GREEN (SUTURE) IMPLANT
SUT ETHIBOND 2-0 RB-1 WHT (SUTURE) ×8 IMPLANT
SUT ETHIBOND 4 0 TF (SUTURE) IMPLANT
SUT ETHIBOND 5 0 C 1 30 (SUTURE) IMPLANT
SUT ETHIBOND NAB MH 2-0 36IN (SUTURE) IMPLANT
SUT ETHIBOND X763 2 0 SH 1 (SUTURE) ×8 IMPLANT
SUT GORETEX 6.0 TH-9 30 IN (SUTURE) IMPLANT
SUT GORETEX CV 4 TH 22 36 (SUTURE) ×4 IMPLANT
SUT GORETEX CV-5THC-13 36IN (SUTURE) IMPLANT
SUT GORETEX CV4 TH-18 (SUTURE) ×8 IMPLANT
SUT GORETEX TH-18 36 INCH (SUTURE) IMPLANT
SUT MNCRL AB 3-0 PS2 18 (SUTURE) IMPLANT
SUT PDS AB 1 CTX 36 (SUTURE) IMPLANT
SUT PROLENE 3 0 SH 1 (SUTURE) IMPLANT
SUT PROLENE 3 0 SH DA (SUTURE) ×8 IMPLANT
SUT PROLENE 4 0 RB 1 (SUTURE) ×1
SUT PROLENE 4 0 SH DA (SUTURE) IMPLANT
SUT PROLENE 4-0 RB1 .5 CRCL 36 (SUTURE) ×3 IMPLANT
SUT PROLENE 5 0 C 1 36 (SUTURE) ×4 IMPLANT
SUT SILK  1 MH (SUTURE)
SUT SILK 1 MH (SUTURE) IMPLANT
SUT SILK 2 0 SH CR/8 (SUTURE) ×4 IMPLANT
SUT STEEL 6MS V (SUTURE) IMPLANT
SUT STEEL STERNAL CCS#1 18IN (SUTURE) IMPLANT
SUT STEEL SZ 6 DBL 3X14 BALL (SUTURE) IMPLANT
SUT VIC AB 2-0 CTX 27 (SUTURE) IMPLANT
SUT VIC AB 3-0 SH 8-18 (SUTURE) ×8 IMPLANT
SYRINGE 10CC LL (SYRINGE) ×4 IMPLANT
SYS ATRICLIP LAA EXCLUSION 45 (CLIP) IMPLANT
SYSTEM SAHARA CHEST DRAIN ATS (WOUND CARE) ×8 IMPLANT
TAPE CLOTH SURG 4X10 WHT LF (GAUZE/BANDAGES/DRESSINGS) ×4 IMPLANT
TOWEL OR 17X24 6PK STRL BLUE (TOWEL DISPOSABLE) ×4 IMPLANT
TOWEL OR 17X26 10 PK STRL BLUE (TOWEL DISPOSABLE) ×8 IMPLANT
TRAY FOLEY IC TEMP SENS 14FR (CATHETERS) ×4 IMPLANT
TROCAR XCEL BLADELESS 5X75MML (TROCAR) ×8 IMPLANT
TROCAR XCEL NON-BLD 11X100MML (ENDOMECHANICALS) ×8 IMPLANT
TUBING INSUFFLATION 10FT LAP (TUBING) IMPLANT
TUNNELER SHEATH ON-Q 11GX8 DSP (PAIN MANAGEMENT) IMPLANT
UNDERPAD 30X30 INCONTINENT (UNDERPADS AND DIAPERS) ×4 IMPLANT
WATER STERILE IRR 1000ML POUR (IV SOLUTION) ×8 IMPLANT
WIRE BENTSON .035X145CM (WIRE) ×4 IMPLANT

## 2013-08-14 NOTE — OR Nursing (Signed)
2nd call to SICU charge nurse @ 13:35

## 2013-08-14 NOTE — OR Nursing (Signed)
1st call to SICU @ 15:00

## 2013-08-14 NOTE — Interval H&P Note (Signed)
History and Physical Interval Note:  08/14/2013 8:42 AM  Daniel Mayo  has presented today for surgery, with the diagnosis of MR AFIB  The various methods of treatment have been discussed with the patient and family. After consideration of risks, benefits and other options for treatment, the patient has consented to  Procedure(s): MINIMALLY INVASIVE MITRAL VALVE (MV) REPLACEMENT OR REPAIR (Right) MINIMALLY INVASIVE MAZE PROCEDURE (N/A) INTRAOPERATIVE TRANSESOPHAGEAL ECHOCARDIOGRAM (N/A) TRICUSPID VALVE REPAIR (N/A) as a surgical intervention .  The patient's history has been reviewed, patient examined, no change in status, stable for surgery.  I have reviewed the patient's chart and labs.  Questions were answered to the patient's satisfaction.     Daniel Mayo

## 2013-08-14 NOTE — Anesthesia Preprocedure Evaluation (Addendum)
Anesthesia Evaluation  Patient identified by MRN, date of birth, ID band Patient awake    Reviewed: Allergy & Precautions, H&P , NPO status , Patient's Chart, lab work & pertinent test results, reviewed documented beta blocker date and time   Airway Mallampati: II TM Distance: >3 FB Neck ROM: Full    Dental  (+) Teeth Intact and Dental Advisory Given   Pulmonary shortness of breath and with exertion,  breath sounds clear to auscultation        Cardiovascular hypertension, Pt. on medications + CAD and + Peripheral Vascular Disease + dysrhythmias Rhythm:Regular Rate:Normal     Neuro/Psych    GI/Hepatic GERD-  Medicated and Controlled,  Endo/Other    Renal/GU Renal disease     Musculoskeletal   Abdominal   Peds  Hematology   Anesthesia Other Findings   Reproductive/Obstetrics                         Anesthesia Physical Anesthesia Plan  ASA: III  Anesthesia Plan: General   Post-op Pain Management:    Induction: Intravenous  Airway Management Planned: Double Lumen EBT  Additional Equipment: Arterial line, CVP, PA Cath and TEE  Intra-op Plan:   Post-operative Plan: Post-operative intubation/ventilation  Informed Consent:   Plan Discussed with: CRNA and Anesthesiologist  Anesthesia Plan Comments:        Anesthesia Quick Evaluation

## 2013-08-14 NOTE — Brief Op Note (Signed)
08/14/2013  3:27 PM  PATIENT:  Daniel Mayo  78 y.o. male  PRE-OPERATIVE DIAGNOSIS:  MR AFIB  POST-OPERATIVE DIAGNOSIS:  MR AFIB  PROCEDURE:  Procedure(s): MINIMALLY INVASIVE MITRAL VALVE (MV)  REPAIR (Right) MINIMALLY INVASIVE MAZE PROCEDURE (N/A) INTRAOPERATIVE TRANSESOPHAGEAL ECHOCARDIOGRAM (N/A)  SURGEON:    Rexene Alberts, MD  ASSISTANTS:  Suzzanne Cloud, PA-C  ANESTHESIA:   Roberts Gaudy, MD  CROSSCLAMP TIME:   27'  CARDIOPULMONARY BYPASS TIME: 188'  FINDINGS:  Mild global LV dysfunction, EF 50%  Degenerative type disease of the mitral valve with severe annular calcification  Type I and Type IIIA mitral valve dysfunction with severe mitral regurgitation  Mild tricuspid regurgitation (1+/2+)  No residual mitral regurgitation after successful valve repair  COMPLICATIONS: None  BASELINE WEIGHT: 109 kg  PATIENT DISPOSITION:   TO SICU IN STABLE CONDITION  OWEN,CLARENCE H 08/14/2013 3:27 PM

## 2013-08-14 NOTE — Preoperative (Signed)
Beta Blockers   Reason not to administer Beta Blockers:Not Applicable, last dose [id number] at [phone]

## 2013-08-14 NOTE — Progress Notes (Addendum)
Echocardiogram TEE has been performed.  Daniel Mayo 08/14/2013, 10:53 AM

## 2013-08-14 NOTE — Anesthesia Procedure Notes (Addendum)
Procedure Name: Intubation Date/Time: 08/14/2013 9:00 AM Performed by: Sampson Si E Pre-anesthesia Checklist: Patient identified, Emergency Drugs available, Suction available, Patient being monitored and Timeout performed Patient Re-evaluated:Patient Re-evaluated prior to inductionOxygen Delivery Method: Circle system utilized Preoxygenation: Pre-oxygenation with 100% oxygen Intubation Type: IV induction Ventilation: Two handed mask ventilation required and Oral airway inserted - appropriate to patient size Laryngoscope Size: Mac and 3 Grade View: Grade III Tube type: Oral Endobronchial tube: Double lumen EBT and Left and 37 Fr Number of attempts: 1 Airway Equipment and Method: Stylet and Fiberoptic brochoscope Placement Confirmation: ETT inserted through vocal cords under direct vision,  positive ETCO2 and breath sounds checked- equal and bilateral Secured at: 27 cm Tube secured with: Tape Dental Injury: Teeth and Oropharynx as per pre-operative assessment    Procedure Name: Intubation Date/Time: 08/14/2013 3:41 PM Performed by: Melina Copa, Britaney Espaillat R Pre-anesthesia Checklist: Patient identified, Emergency Drugs available, Suction available, Patient being monitored and Timeout performed Oxygen Delivery Method: Circle system utilized Preoxygenation: Pre-oxygenation with 100% oxygen Grade View: Grade I Tube type: Parker flex tip Tube size: 8.0 mm Number of attempts: 1 Airway Equipment and Method: Video-laryngoscopy and Bougie stylet (Bougie inserted in EBT, Glide scope visualization of VC, EBT out, Parker flex tip inserted.) Placement Confirmation: ETT inserted through vocal cords under direct vision,  positive ETCO2 and breath sounds checked- equal and bilateral    PA catheter:  Routine monitors. Timeout, sterile prep, drape, FBP L neck.  Trendelenburg position.  1% Lido local, finder and trocar LIJ 1st pass with US guidance.  2-lumen Cordis placed over J wire. PA catheter in easily,  transient PVCs.  Sterile dressing applied.  Patient tolerated well, VSS.  Jenita Seashore, MD  [phone]

## 2013-08-14 NOTE — Transfer of Care (Signed)
Immediate Anesthesia Transfer of Care Note  Patient: Daniel Mayo  Procedure(s) Performed: Procedure(s): MINIMALLY INVASIVE MITRAL VALVE (MV)  REPAIR (Right) MINIMALLY INVASIVE MAZE PROCEDURE (N/A) INTRAOPERATIVE TRANSESOPHAGEAL ECHOCARDIOGRAM (N/A)  Patient Location: ICU  Anesthesia Type:General  Level of Consciousness: unresponsive and Patient remains intubated per anesthesia plan  Airway & Oxygen Therapy: Patient remains intubated per anesthesia plan and Patient placed on Ventilator (see vital sign flow sheet for setting)  Post-op Assessment: Post -op Vital signs reviewed and stable  Post vital signs: Reviewed and stable  Complications: No apparent anesthesia complications

## 2013-08-14 NOTE — Progress Notes (Signed)
PM ROUNDS  S/p mitral repair, maze  BP 187/98  Pulse 90  Temp(Src) 97.5 F (36.4 C) (Oral)  Resp 12  Wt 241 lb 6.4 oz (109.498 kg)  SpO2 100%  PA 37/22 CO=4.06 (CI 1.7)  Intake/Output Summary (Last 24 hours) at 08/14/13 1828 Last data filed at 08/14/13 1800  Gross per 24 hour  Intake   3392 ml  Output   5030 ml  Net  -1638 ml   K= 3.9 Hct= 34  Doing well early postop

## 2013-08-14 NOTE — Op Note (Signed)
CARDIOTHORACIC SURGERY OPERATIVE NOTE  Date of Procedure:   08/14/2013  Preoperative Diagnosis:    Severe Mitral Regurgitation  Recurrent Persistent Atrial Fibrillation  Postoperative Diagnosis: Same  Procedure:    Minimally-Invasive Mitral Valve Repair  Sorin Memo 3D Ring Annuloplasty (size 36mm, catalog #[id number], serial [id number])   Maze Procedure  Complete bilateral atrial lesion set using cryothermy and bipolar radiofrequency ablation  Surgeon: Valentina Gu. Roxy Manns, MD  Assistant: Suzzanne Cloud, PA-C  Anesthesia: Roberts Gaudy, MD  Operative Findings:  Mild global LV dysfunction, EF 50%   Degenerative type disease of the mitral valve with severe annular calcification   Type I and Type IIIA mitral valve dysfunction with severe mitral regurgitation   Mild tricuspid regurgitation (1+/2+)   No residual mitral regurgitation after successful valve repair                  BRIEF CLINICAL NOTE AND INDICATIONS FOR SURGERY  Patient is a 78 year old retired white male with long-standing history of mitral regurgitation and atrial fibrillation referred by Dr. Claudie Leach for possible surgical intervention. The patient states that he was first told that he had a heart murmur during childhood. He states that he was severely ill with pneumonia and treated with bedrest for several months. He does not recall ever being told that he had rheumatic fever. He was told that ever since then he has had a heart murmur. For the last several years he has been followed at [facility] in [facility], most recently by Dr. Claudie Leach. In 2011 he developed persistent atrial fibrillation and was referred to Dr. Caryl Comes performed DC cardioversion. The patient has been maintaining sinus rhythm on oral flecainide. Despite maintaining sinus rhythm the patient has developed progressive exertional shortness of breath. In January 2013 he was referred for diagnostic cardiac catheterization. This was  performed by Dr. Burt Knack and notable for the absence of significant coronary artery disease. At that time patient was noted to have at least moderate mitral regurgitation although pulmonary artery pressures were not elevated. Since then the patient complains that he has developed further progression of exertional shortness of breath and fatigue. He underwent transesophageal echocardiogram on 07/11/2013 by Dr. Claudie Leach. He was found to have moderate left ventricular chamber enlargement with severe mitral regurgitation. 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  Coronary arteriography was performed by Dr Gwenlyn Found and notable for moderate non-obstructive CAD without and clinically significant flow limiting stenosis.  The patient has been seen in consultation and counseled at length regarding the indications, risks and potential benefits of surgery.  All questions have been answered, and the patient provides full informed consent for the operation as described.      DETAILS OF THE OPERATIVE PROCEDURE  Preparation:  The patient is brought to the operating room on the above mentioned date and central monitoring was established by the anesthesia team including placement of Swan-Ganz catheter through the left internal jugular vein.  A radial arterial line is placed. The patient is placed in the supine position on the operating table.  Intravenous antibiotics are administered. General endotracheal anesthesia is induced uneventfully. The patient is initially intubated using a dual lumen endotracheal tube.  A Foley catheter is placed.  Baseline transesophageal echocardiogram was performed.  Findings were notable for mild global LV systolic dysfunction.  The LV was mild to moderately dilated.  There was mild LV hypertrophy.  There was severe mitral regurgitation  with a broad central jet.  Overall leaflet  mobility looked fairly normal (type I dysfunction) although there was thickening and billowing of both leaflets and some restriction of the posterior leaflet due to severe mitral annular calcification (type IIIA dysfunction).  There was normal RV size and function with mild (1+/2+) tricuspid regurgitation.  The tricuspid annulus was not dilated.  There was mild aortic insufficiency.  A soft roll is placed behind the patient's left scapula and the neck gently extended and turned to the left.   The patient's right neck, chest, abdomen, both groins, and both lower extremities are prepared and draped in a sterile manner. A time out procedure is performed.   Surgical Approach:  A right miniature anterolateral thoracotomy incision is performed. The incision is placed just lateral to and superior to the right nipple. The pectoralis major muscle is retracted medially and completely preserved. The right pleural space is entered through the 3rd intercostal space. A soft tissue retractor is placed.  Two 11 mm ports are placed through separate stab incisions inferiorly. The right pleural space is insufflated continuously with carbon dioxide gas through the posterior port during the remainder of the operation.  A pledgeted sutures placed through the dome of the right hemidiaphragm and retracted inferiorly to facilitate exposure.  A longitudinal incision is made in the pericardium 3 cm anterior to the phrenic nerve and silk traction sutures are placed on either side of the incision for exposure.   Extracorporeal Cardiopulmonary Bypass and Myocardial Protection:  A small incision is made in the right inguinal crease and the anterior surface of the right common femoral artery and right common femoral vein are identified.  The patient is placed in Trendelenburg position. The right internal jugular vein is cannulated with Seldinger technique and a guidewire advanced into the right atrium. The patient is heparinized  systemically. The right internal jugular vein is cannulated with a 14 [place] pediatric femoral venous cannula. Pursestring sutures are placed on the anterior surface of the right common femoral vein and right common femoral artery. The right common femoral vein is cannulated with the Seldinger technique and a guidewire is advanced under transesophageal echocardiogram guidance through the right atrium. The femoral vein is cannulated with a long 22 French femoral venous cannula. The right common femoral artery is cannulated with Seldinger technique and a flexible guidewire is advanced until it can be appreciated intraluminally in the descending thoracic aorta on transesophageal echocardiogram. The femoral artery is cannulated with an 18 French femoral arterial cannula.  Adequate heparinization is verified.      The entire pre-bypass portion of the operation was notable for stable hemodynamics.  Cardiopulmonary bypass was begun.  Vacuum assist venous drainage is utilized. The incision in the pericardium is extended in both directions. Venous drainage and exposure are notably excellent.   A retrograde cardioplegia cannula is placed through the right atrium into the coronary sinus using transesophageal echocardiogram guidance.  An antegrade cardioplegia cannula is placed in the ascending aorta.  The patient is cooled to 28C systemic temperature.  The aortic cross clamp is applied and cold blood cardioplegia is delivered initially in an antegrade fashion through the aortic root.  Supplemental cardioplegia is given retrograde through the coronary sinus catheter. The initial cardioplegic arrest is rapid with early diastolic arrest.  Repeat doses of cardioplegia are administered intermittently every 20 to 30 minutes throughout the entire cross clamp portion of the operation through the aortic root and through the coronary sinus catheter in order to  maintain completely flat electrocardiogram.  Myocardial protection was  felt to be excellent.   Maze Procedure (left atrial lesion set):  Following placement of the aortic crossclamp and the administration of the initial arresting dose of cardioplegia, a left atriotomy incision was performed through the interatrial groove and extended partially across the back wall of the left atrium after opening the oblique sinus inferiorly.  The mitral valve and floor of the left atrium are exposed using a self-retaining retractor.    The Atricure CryoICE nitrous oxide cryothermy system is utilized for all cryothermy ablation lesions.  The left atrial lesion set of the Cox cryomaze procedure is now performed using 3 minute duration for all cryothermy lesions.  Initially a lesion is placed along the endocardial surface of the left atrium from the caudad apex of the atriotomy incision across the posterior wall of the left atrium onto the posterior mitral annulus.  A mirror image lesion along the epicardial surface is then performed with the probe posterior to the left atrium, crossing over the coronary sinus.  Two lesions are then performed to create a box isolating all of the pulmonary veins from the remainder of the left atrium.  The first lesion is placed from the cephalad apex of the atriotomy incision across the dome of the left atrium to just anterior to the left sided pulmonary veins.  The second lesion completes the box from the caudad apex of the atriotomy incision across the back wall of the left atrium to connect with the previous lesion just anterior to the left sided pulmonary veins.    The left atrial appendage was not closed due to poor exposure.   Mitral Valve Repair:  The mitral valve was inspected and notable for degenerative thickening and fibrosis of both leaflets and severe mitral annular calcification.  However, leaflet mobility was only partially restricted posteriorly.  There was no significant prolapse.  The subvalvular apparatus was partially restricted by  posterior annular calcification, but there was no chordal thickening nor foreshortening to suggest rheumatic pathology.   Interrupted 2-0 Ethibond horizontal mattress sutures are placed circumferentially around the entire mitral valve annulus. The valve is sized to accept a 30 mm annuloplasty ring based upon the distance between the left and right commissures, the height and the surface area of the anterior leaflet.  The annuloplasty is down-sized to compensate for the restricted leaflet motion and facilitate a larger surface area of coaptation.  A Sorin Memo 3D annuloplasty ring (size 28 mm, catalog # [id number], serial # [id number]) is implanted uneventfully.  All ring sutures were secured using the Cor-knot device.  The valve is again tested with saline and appears to be perfectly competent with a broad symmetrical line of coaptation of the anterior and posterior leaflet. There is no residual leak. Rewarming is begun.  The atriotomy was closed using a 2-layer closure of running 3-0 Prolene suture after placing a sump drain across the mitral valve to serve as a left ventricular vent.  One final dose of warm retrograde "hot shot" cardioplegia was administered retrograde through the coronary sinus catheter while all air was evacuated through the aortic root.  The aortic cross clamp was removed after a total cross clamp time of 127 minutes.   Maze Procedure (right atrial lesion set):  The retrograde cardioplegia cannula was removed and the small hole in the right atrium extended a short distance.  The AtriCure Synergy bipolar radiofrequency ablation clamp is utilized to create a series of linear lesions in the  right atrium, each with one limb of the clamp along the endocardial surface and the other along the epicardial surface. The first lesion is placed from the posterior apex of the atriotomy incision and along the lateral wall of the right atrium to reach the lateral aspect of the superior vena cava. A second  lesion is placed in the opposite direction from the posterior apex of the atriotomy incision along the lateral wall to reach the lateral aspect of the inferior vena cava. A third lesion is placed from the midportion of the atriotomy incision extending at a right angle to reach the tip of the right atrial appendage. A fourth lesion is placed from the anterior apex of the atriotomy incision in an anterior and inferior direction to reach the acute margin of the heart. Finally, the cryotherapy probe is utilized to complete the right atrial lesion set by placing the probe along the endocardial surface of the right atrium from the anterior apex of the atriotomy incision to reach the tricuspid annulus at the 2:00 position. The atriotomy incision is closed with a 2 layer closure of running 4-0 Prolene suture.   Procedure Completion:  Epicardial pacing wires are fixed to the inferior wall of the right ventricule and to the right atrial appendage. The patient is rewarmed to 37C temperature. The left ventricular vent is removed.  The patient is ventilated and flow volumes turndown while the mitral valve repair is inspected using transesophageal echocardiogram. The valve repair appears intact with no residual leak. The antegrade cardioplegia cannula is now removed. The patient is weaned and disconnected from cardiopulmonary bypass.  The patient's rhythm at separation from bypass was AV paced.  The patient was weaned from bypass on low dose dopamine for renal perfusion.  Total cardiopulmonary bypass time for the operation was 188 minutes.  Followup transesophageal echocardiogram performed after separation from bypass revealed a well-seated annuloplasty ring in the mitral position with a normal functioning mitral valve. There was no residual leak.  Left ventricular function was unchanged from preoperatively.  Mean transvalvular gradient across the mitral valve was estimated 5 mmHg.  No other abnormalities were  noted.  The femoral arterial and venous cannulae were removed uneventfully. There was a palpable pulse in the distal right common femoral artery after removal of the cannula. Protamine was administered to reverse the anticoagulation. The right internal jugular cannula was removed and manual pressure held on the neck for 15 minutes.  Single lung ventilation was begun. The atriotomy closure was inspected for hemostasis. The pericardial sac was drained using a 28 French Bard drain placed through the anterior port incision.  The pericardium was closed using a patch of core matrix bovine submucosal tissue patch. The right pleural space is irrigated with saline solution and inspected for hemostasis. The right pleural space was drained using a 28 French Bard drain placed through the posterior port incision. The miniature thoracotomy incision was closed in multiple layers in routine fashion. The right groin incision was inspected for hemostasis and closed in multiple layers in routine fashion.  The post-bypass portion of the operation was notable for stable rhythm and hemodynamics.  Low dose milrinone was added to improve the cardiac output.  Sinus rhythm resumed and atrial pacing was employed.  The patient received a total of 2 packs adult platelets and 2 units fresh frozen plasma due to coagulopathy and thrombocytopenia after separation from cardiopulmonary bypass and reversal of heparin with protamine.    Disposition:  The patient tolerated the procedure well.  The patient was reintubated using a single lumen endotracheal tube and subsequently transported to the surgical intensive care unit in stable condition. There were no intraoperative complications. All sponge instrument and needle counts are verified correct at completion of the operation.    Valentina Gu. Roxy Manns MD 08/14/2013 3:34 PM

## 2013-08-15 ENCOUNTER — Inpatient Hospital Stay (HOSPITAL_COMMUNITY): Payer: Medicare PPO

## 2013-08-15 LAB — POCT I-STAT 3, ART BLOOD GAS (G3+)
Acid-base deficit: 6 mmol/L — ABNORMAL HIGH (ref 0.0–2.0)
Bicarbonate: 20.2 mEq/L (ref 20.0–24.0)
Bicarbonate: 19.9 mEq/L — ABNORMAL LOW (ref 20.0–24.0)
O2 Saturation: 92 %
Patient temperature: 37.7
Patient temperature: 37.8
TCO2: 21 mmol/L (ref 0–100)
TCO2: 22 mmol/L (ref 0–100)
pCO2 arterial: 38.4 mmHg (ref 35.0–45.0)
pCO2 arterial: 40.6 mmHg (ref 35.0–45.0)
pCO2 arterial: 41.1 mmHg (ref 35.0–45.0)
pH, Arterial: 7.272 — ABNORMAL LOW (ref 7.350–7.450)
pH, Arterial: 7.301 — ABNORMAL LOW (ref 7.350–7.450)
pH, Arterial: 7.344 — ABNORMAL LOW (ref 7.350–7.450)
pH, Arterial: 7.315 — ABNORMAL LOW (ref 7.350–7.450)
pO2, Arterial: 84 mmHg (ref 80.0–100.0)
pO2, Arterial: 78 mmHg — ABNORMAL LOW (ref 80.0–100.0)
pO2, Arterial: 60 mmHg — ABNORMAL LOW (ref 80.0–100.0)

## 2013-08-15 LAB — GLUCOSE, CAPILLARY
Glucose-Capillary: 120 mg/dL — ABNORMAL HIGH (ref 70–99)
Glucose-Capillary: 127 mg/dL — ABNORMAL HIGH (ref 70–99)
Glucose-Capillary: 128 mg/dL — ABNORMAL HIGH (ref 70–99)
Glucose-Capillary: 129 mg/dL — ABNORMAL HIGH (ref 70–99)
Glucose-Capillary: 133 mg/dL — ABNORMAL HIGH (ref 70–99)
Glucose-Capillary: 137 mg/dL — ABNORMAL HIGH (ref 70–99)
Glucose-Capillary: 156 mg/dL — ABNORMAL HIGH (ref 70–99)
Glucose-Capillary: 162 mg/dL — ABNORMAL HIGH (ref 70–99)
Glucose-Capillary: 120 mg/dL — ABNORMAL HIGH (ref 70–99)
Glucose-Capillary: 122 mg/dL — ABNORMAL HIGH (ref 70–99)
Glucose-Capillary: 131 mg/dL — ABNORMAL HIGH (ref 70–99)
Glucose-Capillary: 90 mg/dL (ref 70–99)
Glucose-Capillary: 153 mg/dL — ABNORMAL HIGH (ref 70–99)
Glucose-Capillary: 93 mg/dL (ref 70–99)

## 2013-08-15 LAB — POCT I-STAT, CHEM 8
BUN: 19 mg/dL (ref 6–23)
BUN: 23 mg/dL (ref 6–23)
Calcium, Ion: 1.13 mmol/L (ref 1.13–1.30)
Calcium, Ion: 1.16 mmol/L (ref 1.13–1.30)
Chloride: 104 mEq/L (ref 96–112)
Creatinine, Ser: 1.4 mg/dL — ABNORMAL HIGH (ref 0.50–1.35)
Creatinine, Ser: 1.9 mg/dL — ABNORMAL HIGH (ref 0.50–1.35)
Glucose, Bld: 144 mg/dL — ABNORMAL HIGH (ref 70–99)
Glucose, Bld: 121 mg/dL — ABNORMAL HIGH (ref 70–99)
HCT: 33 % — ABNORMAL LOW (ref 39.0–52.0)
HCT: 31 % — ABNORMAL LOW (ref 39.0–52.0)
Hemoglobin: 11.2 g/dL — ABNORMAL LOW (ref 13.0–17.0)
Hemoglobin: 10.5 g/dL — ABNORMAL LOW (ref 13.0–17.0)
Potassium: 3.9 mEq/L (ref 3.5–5.1)
Sodium: 142 mEq/L (ref 135–145)
TCO2: 20 mmol/L (ref 0–100)

## 2013-08-15 LAB — BASIC METABOLIC PANEL
CO2: 21 mEq/L (ref 19–32)
Calcium: 7.6 mg/dL — ABNORMAL LOW (ref 8.4–10.5)
Chloride: 109 mEq/L (ref 96–112)
GFR calc non Af Amer: 43 mL/min — ABNORMAL LOW (ref >90)
Glucose, Bld: 135 mg/dL — ABNORMAL HIGH (ref 70–99)
Potassium: 4.1 mEq/L (ref 3.5–5.1)
Sodium: 140 mEq/L (ref 135–145)

## 2013-08-15 LAB — PREPARE FRESH FROZEN PLASMA: Unit division: 0

## 2013-08-15 LAB — CBC
Hemoglobin: 11.7 g/dL — ABNORMAL LOW (ref 13.0–17.0)
MCH: 34.2 pg — ABNORMAL HIGH (ref 26.0–34.0)
MCH: 33.4 pg (ref 26.0–34.0)
MCV: 93.6 fL (ref 78.0–100.0)
Platelets: 105 10*3/uL — ABNORMAL LOW (ref 150–400)
Platelets: 87 10*3/uL — ABNORMAL LOW (ref 150–400)
RBC: 3.42 MIL/uL — ABNORMAL LOW (ref 4.22–5.81)
RBC: 3.44 MIL/uL — ABNORMAL LOW (ref 4.22–5.81)
RDW: 13.4 % (ref 11.5–15.5)
RDW: 13.9 % (ref 11.5–15.5)
WBC: 13.1 10*3/uL — ABNORMAL HIGH (ref 4.0–10.5)
WBC: 15.9 10*3/uL — ABNORMAL HIGH (ref 4.0–10.5)

## 2013-08-15 LAB — PREPARE PLATELET PHERESIS

## 2013-08-15 LAB — MAGNESIUM
Magnesium: 2.2 mg/dL (ref 1.5–2.5)
Magnesium: 2.1 mg/dL (ref 1.5–2.5)

## 2013-08-15 LAB — CREATININE, SERUM
Creatinine, Ser: 1.62 mg/dL — ABNORMAL HIGH (ref 0.50–1.35)
GFR calc Af Amer: 46 mL/min — ABNORMAL LOW (ref >90)

## 2013-08-15 LAB — TYPE AND SCREEN

## 2013-08-15 MED ORDER — METOCLOPRAMIDE HCL 5 MG/ML IJ SOLN
10.0000 mg | Freq: Four times a day (QID) | INTRAMUSCULAR | Status: DC
  Administered 2013-08-16 – 2013-08-17 (×6): 10 mg via INTRAVENOUS
  Filled 2013-08-15 (×11): qty 2

## 2013-08-15 MED ORDER — FUROSEMIDE 10 MG/ML IJ SOLN
10.0000 mg/h | INTRAVENOUS | Status: DC
  Administered 2013-08-15: 10 mg/h via INTRAVENOUS
  Filled 2013-08-15 (×2): qty 25

## 2013-08-15 MED ORDER — WARFARIN SODIUM 2.5 MG PO TABS
2.5000 mg | ORAL_TABLET | Freq: Once | ORAL | Status: AC
Start: 2013-08-15 — End: 2013-08-15
  Administered 2013-08-15: 2.5 mg via ORAL
  Filled 2013-08-15 (×2): qty 1

## 2013-08-15 MED ORDER — POTASSIUM CHLORIDE 10 MEQ/50ML IV SOLN
10.0000 meq | INTRAVENOUS | Status: AC
  Administered 2013-08-15 – 2013-08-16 (×3): 10 meq via INTRAVENOUS

## 2013-08-15 MED ORDER — INSULIN DETEMIR 100 UNIT/ML ~~LOC~~ SOLN
10.0000 [IU] | Freq: Once | SUBCUTANEOUS | Status: AC
  Administered 2013-08-15: 10 [IU] via SUBCUTANEOUS
  Filled 2013-08-15: qty 0.1

## 2013-08-15 MED ORDER — INSULIN ASPART 100 UNIT/ML ~~LOC~~ SOLN: 0.0000 [IU] | SUBCUTANEOUS | Status: DC

## 2013-08-15 MED ORDER — WARFARIN - PHYSICIAN DOSING INPATIENT: Freq: Every day | Status: DC

## 2013-08-15 MED ORDER — PANTOPRAZOLE SODIUM 40 MG PO TBEC
40.0000 mg | DELAYED_RELEASE_TABLET | Freq: Every day | ORAL | Status: DC
  Administered 2013-08-15 – 2013-08-17 (×3): 40 mg via ORAL

## 2013-08-15 MED ORDER — FUROSEMIDE 10 MG/ML IJ SOLN: 10.0000 mg/h | INTRAVENOUS | Status: DC

## 2013-08-15 MED ORDER — PROMETHAZINE HCL 25 MG/ML IJ SOLN
12.5000 mg | Freq: Four times a day (QID) | INTRAMUSCULAR | Status: DC | PRN
  Administered 2013-08-15: 12.5 mg via INTRAVENOUS
  Filled 2013-08-15 (×2): qty 1

## 2013-08-15 MED FILL — Mannitol IV Soln 20%: INTRAVENOUS | Qty: 500 | Status: AC

## 2013-08-15 MED FILL — Heparin Sodium (Porcine) Inj 1000 Unit/ML: INTRAMUSCULAR | Qty: 10 | Status: AC

## 2013-08-15 MED FILL — Potassium Chloride Inj 2 mEq/ML: INTRAVENOUS | Qty: 40 | Status: AC

## 2013-08-15 MED FILL — Electrolyte-R (PH 7.4) Solution: INTRAVENOUS | Qty: 4000 | Status: AC

## 2013-08-15 MED FILL — Sodium Bicarbonate IV Soln 8.4%: INTRAVENOUS | Qty: 50 | Status: AC

## 2013-08-15 MED FILL — Lidocaine HCl IV Inj 20 MG/ML: INTRAVENOUS | Qty: 5 | Status: AC

## 2013-08-15 MED FILL — Heparin Sodium (Porcine) Inj 1000 Unit/ML: INTRAMUSCULAR | Qty: 30 | Status: AC

## 2013-08-15 MED FILL — Sodium Chloride IV Soln 0.9%: INTRAVENOUS | Qty: 2000 | Status: AC

## 2013-08-15 MED FILL — Magnesium Sulfate Inj 50%: INTRAMUSCULAR | Qty: 10 | Status: AC

## 2013-08-15 MED FILL — Sodium Chloride IV Soln 0.9%: INTRAVENOUS | Qty: 1000 | Status: AC

## 2013-08-15 MED FILL — Sodium Chloride Irrigation Soln 0.9%: Qty: 3000 | Status: AC

## 2013-08-15 NOTE — Care Management Note (Signed)
    Page 1 of 1   08/15/2013     3:46:51 PM   CARE MANAGEMENT NOTE 08/15/2013  Patient:  Daniel Mayo, Daniel Mayo   Account Number:  [id number]  Date Initiated:  08/15/2013  Documentation initiated by:  [facility]  Subjective/Objective Assessment:   Post op MVR and Maze     Action/Plan:   Anticipated DC Date:  08/20/2013   Anticipated DC Plan:  [place]  CM consult      Choice offered to / List presented to:             Status of service:  In process, will continue to follow Medicare Important Message given?   (If response is "NO", the following Medicare IM given date fields will be blank) Date Medicare IM given:   Date Additional Medicare IM given:    Discharge Disposition:    Per UR Regulation:  Reviewed for med. necessity/level of care/duration of stay  If discussed at [place] of Stay Meetings, dates discussed:    Comments:  ContactAnnia Friendly Daughter [phone]   Robbi Garter [phone]   [phone]   08-15-13 [place], [place] [phone] patient lives at home alone and is independent.  Has already set up for a friend  and family to be with him on dc 24/7.

## 2013-08-15 NOTE — Progress Notes (Signed)
CT surgery p.m. Rounds  Maintained sinus rhythm and stable hemodynamics Excellent diuresis with Lasix drip Persistent nausea will add Phenergan and Reglan Evening labs reviewed creatinine 1.6 hematocrit 33

## 2013-08-15 NOTE — Progress Notes (Signed)
Patient tachypneic and oxygen saturations at 89% on 6LNC.  Patient stated he has back pain.  Pt trialed on venturimask at 50% and then placed on non-rebreather mask.  Pain medication given by nurse and respirations calmed; RR now 24 bpm.  Oxygen saturations 95% on NRB mask.  RT will monitor.

## 2013-08-15 NOTE — Progress Notes (Signed)
Anesthesiology Follow-up:  Awake and alert, neuro intact, having some R. sided incisional pain. Hemodynamically stable.  VS: T-36.9 BP 111/50 RR-15 HR 71 (SR)   K-4.1 Na-140 BUN/Cr.-20/1.52 H/H- 11.7/32.0 Platelets 105,000  Extubated 8 hours post-op.  78 year old male one day S/P mitral valve annuloplasty and Maze procedure. Stable post-op course so far.  Roberts Gaudy, MD

## 2013-08-15 NOTE — Progress Notes (Addendum)
[facility] DAILY ICU PROGRESS NOTE                   [address]            [place] [address]          [phone]   1 Day Post-Op Procedure(s) (LRB): MINIMALLY INVASIVE MITRAL VALVE (MV)  REPAIR (Right) MINIMALLY INVASIVE MAZE PROCEDURE (N/A) INTRAOPERATIVE TRANSESOPHAGEAL ECHOCARDIOGRAM (N/A)  Total Length of Stay:  LOS: 1 day   Subjective: Awake, alert, sore, but otherwise doing well.   Objective: Vital signs in last 24 hours: Temp:  [96.1 F (35.6 C)-100.2 F (37.9 C)] 99.1 F (37.3 C) (11/20 0715) Pulse Rate:  [64-90] 71 (11/20 0715) Cardiac Rhythm:  [-] Normal sinus rhythm (11/20 0500) Resp:  [0-27] 17 (11/20 0630) BP: (96-137)/(52-97) 119/63 mmHg (11/20 0715) SpO2:  [89 %-100 %] 97 % (11/20 0715) Arterial Line BP: (113-167)/(50-76) 148/57 mmHg (11/20 0715) FiO2 (%):  [40 %-80 %] 80 % (11/20 0520) Weight:  [256 lb 13.4 oz (116.5 kg)] 256 lb 13.4 oz (116.5 kg) (11/20 0500)  Filed Weights   08/13/13 1300 08/15/13 0500  Weight: 241 lb 6.4 oz (109.498 kg) 256 lb 13.4 oz (116.5 kg)   BASELINE WEIGHT: 109 kg  Weight change: 15 lb 7 oz (7.002 kg)   Hemodynamic parameters for last 24 hours: PAP: (29-57)/(11-30) 38/13 mmHg CO:  [3.9 L/min-5.5 L/min] 4.7 L/min CI:  [1.7 L/min/m2-2.3 L/min/m2] 2 L/min/m2  Intake/Output from previous day: 11/19 0701 - 11/20 0700 In: 6399.6 [I.V.:3707.6; Blood:1892; IV [id number] Out: [id number] [Urine:4505; Blood:1550; Chest Tube:500]  CBGs 150-128-127-120-133-134-134-162-137-135     Current Meds: Scheduled Meds: . acetaminophen  1,000 mg Oral Q6H  . aspirin EC  325 mg Oral Daily  . bisacodyl  10 mg Oral Daily   Or  . bisacodyl  10 mg Rectal Daily  . cefUROXime (ZINACEF)  IV  1.5 g Intravenous Q12H  . Chlorhexidine Gluconate Cloth  6 each Topical Q0600  . docusate sodium  200 mg Oral Daily  . flecainide  50 mg Oral Daily  . insulin aspart  0-24 Units Subcutaneous Q4H  . insulin regular  0-10 Units Intravenous  TID WC  . mupirocin ointment  1 application Nasal BID  . [START ON 08/16/2013] pantoprazole  40 mg Oral Daily  . sodium chloride  3 mL Intravenous Q12H   Continuous Infusions: . sodium chloride 20 mL/hr (08/14/13 1700)  . sodium chloride 20 mL/hr (08/14/13 1615)  . sodium chloride    . DOPamine 3 mcg/kg/min (08/14/13 1700)  . furosemide (LASIX) infusion    . insulin (NOVOLIN-R) infusion 2.4 Units/hr (08/15/13 0700)  . lactated ringers 20 mL/hr (08/14/13 1700)  . milrinone 0.3 mcg/kg/min (08/14/13 2206)  . nitroGLYCERIN 45 mcg/min (08/15/13 0630)  . phenylephrine (NEO-SYNEPHRINE) Adult infusion Stopped (08/14/13 1615)   PRN Meds:.albumin human, metoprolol, morphine injection, ondansetron (ZOFRAN) IV, oxyCODONE, sodium chloride   Physical Exam: General appearance: alert, cooperative and no distress Heart: regular rate and rhythm Lungs: few crackles in the bases, R>L Extremities: Mild LE edema Wound: Dressed and dry  Lab Results: CBC: Recent Labs  08/14/13 2158 08/15/13 0405  WBC 11.8* 13.1*  HGB 12.2* 11.7*  HCT 34.0* 32.0*  PLT 97* 105*   BMET:  Recent Labs  08/14/13 2157 08/14/13 2158 08/15/13 0405  NA 142  --  140  K 4.2  --  4.1  CL 107  --  109  CO2  --   --  21  GLUCOSE 144*  --  135*  BUN 19  --  20  CREATININE 1.40* 1.28 1.52*  CALCIUM  --   --  7.6*    PT/INR:  Recent Labs  08/14/13 1612  LABPROT 16.3*  INR 1.34   Radiology: Dg Chest Portable 1 View In Am  08/15/2013   CLINICAL DATA:  Post mitral valve surgery.  EXAM: PORTABLE CHEST - 1 VIEW  COMPARISON:  08/14/2013  FINDINGS: The endotracheal tube and nasogastric have been removed. Swan-Ganz catheter is in the distal main pulmonary artery region. Right-sided chest tubes appear to be in stable position. No evidence for a large pneumothorax. Low lung volumes and right basilar densities. Right basilar densities may represent a combination of atelectasis and parenchymal disease. Heart size is grossly  stable.  IMPRESSION: Low lung volumes with right basilar densities.  Negative for a pneumothorax.   Electronically Signed   By: Markus Daft M.D.   On: 08/15/2013 07:41   Dg Chest Portable 1 View  08/14/2013   CLINICAL DATA:  Postop mitral valve replacement.  EXAM: PORTABLE CHEST - 1 VIEW  COMPARISON:  08/09/2013  FINDINGS: Stable cardiomediastinal silhouette. There is a surgical drain noted along the inferior portion of the cardiac silhouette. Trace bilateral pleural effusions. Right basilar atelectasis. There is no pneumothorax. There is a a right-sided chest tube.  There is an endotracheal tube with the tip 5 cm above the carina. There is a Swan-Ganz catheter with the tip at the pulmonary outflow tract. There is a nasogastric tube coursing below the diaphragm, excluded from the field of view. The osseous structures are unremarkable.  IMPRESSION: Right basilar atelectasis.  Support lines and tubing in satisfactory position.   Electronically Signed   By: Kathreen Devoid   On: 08/14/2013 16:47   Chronic Kidney Disease   Stage I     GFR >90  Stage II    GFR 60-89  Stage IIIA GFR 45-59  Stage IIIB GFR 30-44  Stage IV   GFR 15-29  Stage V    GFR  <15  Lab Results  Component Value Date   CREATININE 1.52* 08/15/2013   The CrCl is unknown because both a height and weight (above a minimum accepted value) are required for this calculation.   Assessment/Plan: S/P Procedure(s) (LRB): MINIMALLY INVASIVE MITRAL VALVE (MV)  REPAIR (Right) MINIMALLY INVASIVE MAZE PROCEDURE (N/A) INTRAOPERATIVE TRANSESOPHAGEAL ECHOCARDIOGRAM (N/A)  CV- Accelerated junctional rhythm this am, rates 70s.  Will resume Flecainide, d/c Lopressor, wean drips as tolerated. Start low dose Coumadin.  Vol overload- diurese.  Chronic renal insufficiency- Cr stable at baseline.  UOP excellent.  Expected postop blood loss anemia- H/H stable.  Mobilize, routine POD# 1 progression.  COLLINS,GINA H 08/15/2013 8:03 AM  In  accelerated nodal rhythm Baseline cr, IIIa CKD I have seen and examined Azar F Karge and agree with the above assessment  and plan.  Grace Isaac MD Beeper [phone] Office [phone] 08/15/2013 8:28 AM

## 2013-08-15 NOTE — Progress Notes (Signed)
Utilization review completed. Daniele Dillow, RN, BSN. 

## 2013-08-15 NOTE — Plan of Care (Signed)
Problem: Phase II Progression Outcomes Goal: Patient extubated within - Outcome: Completed/Met Date Met:  08/15/13 Extubated within 6-12 hours

## 2013-08-15 NOTE — Procedures (Signed)
Extubation Procedure Note  Patient Details:   Name: PRANEETH HILBURN DOB: November 05, 1934 MRN: [id number]   Airway Documentation:     Evaluation  O2 sats: currently acceptable Complications: No apparent complications Patient did tolerate procedure well. Bilateral Breath Sounds: Clear;Diminished   Yes  NIF - 24cmH20 FVC .680L   Pt extubated to [facility].  Pt oral and ETT suctioned prior to extubation.  Pt had good cuff leak prior to extubation; no stridor auscultated post-extubation.  Pt vocalizing and had productive cough post-extubation.   Theo Dills 08/15/2013, 12:28 AM

## 2013-08-16 ENCOUNTER — Encounter (HOSPITAL_COMMUNITY): Payer: Self-pay | Admitting: Thoracic Surgery (Cardiothoracic Vascular Surgery)

## 2013-08-16 ENCOUNTER — Inpatient Hospital Stay (HOSPITAL_COMMUNITY): Payer: Medicare PPO

## 2013-08-16 LAB — BASIC METABOLIC PANEL
CO2: 26 mEq/L (ref 19–32)
Calcium: 7.8 mg/dL — ABNORMAL LOW (ref 8.4–10.5)
GFR calc Af Amer: 37 mL/min — ABNORMAL LOW (ref >90)
GFR calc non Af Amer: 31 mL/min — ABNORMAL LOW (ref >90)
Potassium: 4.7 mEq/L (ref 3.5–5.1)
Sodium: 137 mEq/L (ref 135–145)

## 2013-08-16 LAB — PROTIME-INR
INR: 1.22 (ref 0.00–1.49)
Prothrombin Time: 15.1 seconds (ref 11.6–15.2)

## 2013-08-16 LAB — CBC
HCT: 33.4 % — ABNORMAL LOW (ref 39.0–52.0)
Hemoglobin: 11.9 g/dL — ABNORMAL LOW (ref 13.0–17.0)
MCH: 34.3 pg — ABNORMAL HIGH (ref 26.0–34.0)
Platelets: 80 10*3/uL — ABNORMAL LOW (ref 150–400)
RBC: 3.47 MIL/uL — ABNORMAL LOW (ref 4.22–5.81)
RDW: 13.9 % (ref 11.5–15.5)

## 2013-08-16 LAB — GLUCOSE, CAPILLARY: Glucose-Capillary: 98 mg/dL (ref 70–99)

## 2013-08-16 MED ORDER — FUROSEMIDE 80 MG PO TABS
80.0000 mg | ORAL_TABLET | Freq: Every day | ORAL | Status: DC
Start: 2013-08-17 — End: 2013-08-17
  Administered 2013-08-17: 80 mg via ORAL
  Filled 2013-08-16: qty 2
  Filled 2013-08-16: qty 1
  Filled 2013-08-16: qty 2

## 2013-08-16 MED ORDER — ASPIRIN EC 81 MG PO TBEC
81.0000 mg | DELAYED_RELEASE_TABLET | Freq: Every day | ORAL | Status: DC
  Administered 2013-08-16 – 2013-08-17 (×2): 81 mg via ORAL
  Filled 2013-08-16 (×2): qty 1

## 2013-08-16 MED ORDER — WARFARIN SODIUM 2.5 MG PO TABS
2.5000 mg | ORAL_TABLET | Freq: Once | ORAL | Status: AC
  Administered 2013-08-16: 2.5 mg via ORAL
  Filled 2013-08-16: qty 1

## 2013-08-16 NOTE — Anesthesia Postprocedure Evaluation (Signed)
  Anesthesia Post-op Note  Patient: Daniel Mayo  Procedure(s) Performed: Procedure(s): MINIMALLY INVASIVE MITRAL VALVE (MV)  REPAIR (Right) MINIMALLY INVASIVE MAZE PROCEDURE (N/A) INTRAOPERATIVE TRANSESOPHAGEAL ECHOCARDIOGRAM (N/A)  Patient Location: ICU  Anesthesia Type:General  Level of Consciousness: awake, alert  and oriented  Airway and Oxygen Therapy: Patient Spontanous Breathing and Patient connected to nasal cannula oxygen  Post-op Pain: none  Post-op Assessment: Post-op Vital signs reviewed, Patient's Cardiovascular Status Stable, Respiratory Function Stable, Patent Airway, No signs of Nausea or vomiting and Pain level controlled  Post-op Vital Signs: Reviewed and stable  Complications: No apparent anesthesia complications

## 2013-08-16 NOTE — Progress Notes (Signed)
2 Days Post-Op Procedure(s) (LRB): MINIMALLY INVASIVE MITRAL VALVE (MV)  REPAIR (Right) MINIMALLY INVASIVE MAZE PROCEDURE (N/A) INTRAOPERATIVE TRANSESOPHAGEAL ECHOCARDIOGRAM (N/A) Subjective:  Sore right chest wall  Objective: Vital signs in last 24 hours: Temp:  [97.6 F (36.4 C)-98.8 F (37.1 C)] 97.6 F (36.4 C) (11/21 0741) Pulse Rate:  [62-80] 77 (11/21 0700) Cardiac Rhythm:  [-] Normal sinus rhythm (11/21 0100) Resp:  [6-25] 16 (11/21 0700) BP: (98-146)/(57-76) 137/70 mmHg (11/21 0700) SpO2:  [93 %-100 %] 97 % (11/21 0700) Arterial Line BP: (105-173)/(48-74) 157/62 mmHg (11/21 0700) Weight:  [111.8 kg (246 lb 7.6 oz)] 111.8 kg (246 lb 7.6 oz) (11/21 0600)  Hemodynamic parameters for last 24 hours: PAP: (36)/(10) 36/10 mmHg CO:  [5.3 L/min] 5.3 L/min CI:  [2.3 L/min/m2] 2.3 L/min/m2  Intake/Output from previous day: 11/20 0701 - 11/21 0700 In: 2856.9 [P.O.:1440; I.V.:1366.9; IV Piggyback:50] Out: [id number]; Chest Tube:400] Intake/Output this shift:    General appearance: alert and cooperative Neurologic: intact Heart: regular rate and rhythm, S1, S2 normal, no murmur, click, rub or gallop Lungs: rales RLL Abdomen: soft, non-tender; bowel sounds normal; no masses,  no organomegaly Extremities: edema minimal Wound: dressing dry  Lab Results:  Recent Labs  08/15/13 1700 08/16/13 0420  WBC 15.9* 14.7*  HGB 11.5* 11.9*  HCT 32.7* 33.4*  PLT 87* 80*   BMET:  Recent Labs  08/15/13 0405 08/15/13 1653 08/15/13 1700 08/16/13 0420  NA 140 139  --  137  K 4.1 3.9  --  4.7  CL 109 104  --  103  CO2 21  --   --  26  GLUCOSE 135* 121*  --  113*  BUN 20 23  --  26*  CREATININE 1.52* 1.90* 1.62* 1.97*  CALCIUM 7.6*  --   --  7.8*    PT/INR:  Recent Labs  08/16/13 0420  LABPROT 15.1  INR 1.22   ABG    Component Value Date/Time   PHART 7.315* 08/15/2013 0518   HCO3 20.9 08/15/2013 0518   TCO2 21 08/15/2013 1653   ACIDBASEDEF 5.0* 08/15/2013  0518   O2SAT 88.0 08/15/2013 0518   CBG (last 3)   Recent Labs  08/15/13 1022 08/15/13 1338 08/15/13 1526  GLUCAP 90 153* 93   CXR: clear  Assessment/Plan: S/P Procedure(s) (LRB): MINIMALLY INVASIVE MITRAL VALVE (MV)  REPAIR (Right) MINIMALLY INVASIVE MAZE PROCEDURE (N/A) INTRAOPERATIVE TRANSESOPHAGEAL ECHOCARDIOGRAM (N/A) Hemodynamically stable in sinus rhythm Excellent diuresis yesterday on lasix drip. Lost 10 lbs. Now only 5 lbs over preop and creat up a little from last night. Will stop lasix drip and start oral lasix tomorrow. Wean dopamine  Chest tube output minimal. Will remove. Mobilize d/c tubes/lines Continue foley due to patient in ICU and urinary output monitoring Continue coumadin Follow up labs in am.   LOS: 2 days    Gilford Raid K 08/16/2013

## 2013-08-16 NOTE — Progress Notes (Signed)
Patient ID: Daniel Mayo, male   DOB: 1935/09/19, 78 y.o.   MRN: [id number] EVENING ROUNDS NOTE :     [address]       [place] [address]             [phone]                 2 Days Post-Op Procedure(s) (LRB): MINIMALLY INVASIVE MITRAL VALVE (MV)  REPAIR (Right) MINIMALLY INVASIVE MAZE PROCEDURE (N/A) INTRAOPERATIVE TRANSESOPHAGEAL ECHOCARDIOGRAM (N/A)  Total Length of Stay:  LOS: 2 days  BP 127/66  Pulse 68  Temp(Src) 97.7 F (36.5 C) (Oral)  Resp 16  Wt 246 lb 7.6 oz (111.8 kg)  SpO2 99%  .Intake/Output     11/20 0701 - 11/21 0700 11/21 0701 - 11/22 0700   P.O. 1440 480   I.V. (mL/kg) 1366.9 (12.2) 302.4 (2.7)   Blood     IV Piggyback 50    Total Intake(mL/kg) 2856.9 (25.6) 782.4 (7)   Urine (mL/kg/hr) 6625 (2.5) 1575 (1.3)   Blood     Chest Tube 400 (0.1) 50 (0)   Total Output 7025 1625   Net -4168.1 -842.6          . sodium chloride 20 mL/hr (08/15/13 0800)  . sodium chloride Stopped (08/16/13 1100)  . sodium chloride    . DOPamine Stopped (08/16/13 1000)  . lactated ringers 20 mL/hr (08/14/13 1700)  . nitroGLYCERIN Stopped (08/15/13 1500)  . phenylephrine (NEO-SYNEPHRINE) Adult infusion Stopped (08/14/13 1615)     Lab Results  Component Value Date   WBC 14.7* 08/16/2013   HGB 11.9* 08/16/2013   HCT 33.4* 08/16/2013   PLT 80* 08/16/2013   GLUCOSE 113* 08/16/2013   ALT 16 08/09/2013   AST 20 08/09/2013   NA 137 08/16/2013   K 4.7 08/16/2013   CL 103 08/16/2013   CREATININE 1.97* 08/16/2013   BUN 26* 08/16/2013   CO2 26 08/16/2013   INR 1.22 08/16/2013   HGBA1C 5.0 08/09/2013   Walking in unit Started  on coumadin Off drips Still with thrombocytopenia/ avoid heparin  Grace Isaac MD  Beeper [phone] Office [phone] 08/16/2013 5:36 PM

## 2013-08-16 NOTE — Anesthesia Postprocedure Evaluation (Signed)
  Anesthesia Post-op Note  Patient: Daniel Mayo  Procedure(s) Performed: Procedure(s): MINIMALLY INVASIVE MITRAL VALVE (MV)  REPAIR (Right) MINIMALLY INVASIVE MAZE PROCEDURE (N/A) INTRAOPERATIVE TRANSESOPHAGEAL ECHOCARDIOGRAM (N/A)  Patient Location: SICU  Anesthesia Type:General  Level of Consciousness: awake, alert  and oriented  Airway and Oxygen Therapy: Patient Spontanous Breathing and Patient connected to nasal cannula oxygen  Post-op Pain: mild  Post-op Assessment: Post-op Vital signs reviewed, Patient's Cardiovascular Status Stable, Respiratory Function Stable, Patent Airway and Pain level controlled  Post-op Vital Signs: stable  Complications: No apparent anesthesia complications

## 2013-08-17 ENCOUNTER — Inpatient Hospital Stay (HOSPITAL_COMMUNITY): Payer: Medicare PPO

## 2013-08-17 LAB — GLUCOSE, CAPILLARY
Glucose-Capillary: 118 mg/dL — ABNORMAL HIGH (ref 70–99)
Glucose-Capillary: 109 mg/dL — ABNORMAL HIGH (ref 70–99)
Glucose-Capillary: 95 mg/dL (ref 70–99)
Glucose-Capillary: 90 mg/dL (ref 70–99)

## 2013-08-17 LAB — CBC
HCT: 29 % — ABNORMAL LOW (ref 39.0–52.0)
MCH: 34.7 pg — ABNORMAL HIGH (ref 26.0–34.0)
MCHC: 35.9 g/dL (ref 30.0–36.0)
Platelets: 68 10*3/uL — ABNORMAL LOW (ref 150–400)
RDW: 13.9 % (ref 11.5–15.5)
WBC: 9 10*3/uL (ref 4.0–10.5)

## 2013-08-17 LAB — BASIC METABOLIC PANEL
BUN: 32 mg/dL — ABNORMAL HIGH (ref 6–23)
GFR calc Af Amer: 32 mL/min — ABNORMAL LOW (ref >90)
GFR calc non Af Amer: 28 mL/min — ABNORMAL LOW (ref >90)
Glucose, Bld: 87 mg/dL (ref 70–99)
Potassium: 3.7 mEq/L (ref 3.5–5.1)
Sodium: 137 mEq/L (ref 135–145)

## 2013-08-17 LAB — PROTIME-INR
INR: 1.25 (ref 0.00–1.49)
Prothrombin Time: 15.4 seconds — ABNORMAL HIGH (ref 11.6–15.2)

## 2013-08-17 MED ORDER — BISACODYL 5 MG PO TBEC: 10.0000 mg | DELAYED_RELEASE_TABLET | Freq: Every day | ORAL | Status: DC | PRN

## 2013-08-17 MED ORDER — TRAMADOL HCL 50 MG PO TABS
50.0000 mg | ORAL_TABLET | ORAL | Status: DC | PRN
  Administered 2013-08-18: 50 mg via ORAL
  Filled 2013-08-17: qty 1

## 2013-08-17 MED ORDER — FINASTERIDE 5 MG PO TABS
5.0000 mg | ORAL_TABLET | Freq: Every day | ORAL | Status: DC
  Administered 2013-08-17 – 2013-08-19 (×3): 5 mg via ORAL
  Filled 2013-08-17 (×3): qty 1

## 2013-08-17 MED ORDER — WARFARIN SODIUM 2.5 MG PO TABS
2.5000 mg | ORAL_TABLET | Freq: Once | ORAL | Status: AC
  Administered 2013-08-17: 2.5 mg via ORAL
  Filled 2013-08-17: qty 1

## 2013-08-17 MED ORDER — INSULIN ASPART 100 UNIT/ML ~~LOC~~ SOLN: 0.0000 [IU] | Freq: Three times a day (TID) | SUBCUTANEOUS | Status: DC

## 2013-08-17 MED ORDER — SODIUM CHLORIDE 0.9 % IV SOLN: 250.0000 mL | INTRAVENOUS | Status: DC | PRN

## 2013-08-17 MED ORDER — DOCUSATE SODIUM 100 MG PO CAPS
200.0000 mg | ORAL_CAPSULE | Freq: Every day | ORAL | Status: DC
  Administered 2013-08-17 – 2013-08-19 (×3): 200 mg via ORAL
  Filled 2013-08-17 (×2): qty 2

## 2013-08-17 MED ORDER — BISACODYL 10 MG RE SUPP: 10.0000 mg | Freq: Every day | RECTAL | Status: DC | PRN

## 2013-08-17 MED ORDER — SODIUM CHLORIDE 0.9 % IJ SOLN
3.0000 mL | Freq: Two times a day (BID) | INTRAMUSCULAR | Status: DC
  Administered 2013-08-17 – 2013-08-18 (×2): 3 mL via INTRAVENOUS

## 2013-08-17 MED ORDER — PANTOPRAZOLE SODIUM 40 MG PO TBEC
40.0000 mg | DELAYED_RELEASE_TABLET | Freq: Every day | ORAL | Status: DC
  Administered 2013-08-18: 40 mg via ORAL
  Filled 2013-08-17: qty 1

## 2013-08-17 MED ORDER — ASPIRIN EC 81 MG PO TBEC
81.0000 mg | DELAYED_RELEASE_TABLET | Freq: Every day | ORAL | Status: DC
  Administered 2013-08-17 – 2013-08-19 (×3): 81 mg via ORAL
  Filled 2013-08-17 (×3): qty 1

## 2013-08-17 MED ORDER — SODIUM CHLORIDE 0.9 % IJ SOLN: 3.0000 mL | INTRAMUSCULAR | Status: DC | PRN

## 2013-08-17 MED ORDER — MOVING RIGHT ALONG BOOK
Freq: Once | Status: AC
  Administered 2013-08-17: 14:00:00
  Filled 2013-08-17: qty 1

## 2013-08-17 MED ORDER — OXYCODONE HCL 5 MG PO TABS
5.0000 mg | ORAL_TABLET | ORAL | Status: DC | PRN
  Administered 2013-08-18: 5 mg via ORAL
  Administered 2013-08-19 (×2): 10 mg via ORAL
  Filled 2013-08-17 (×2): qty 2
  Filled 2013-08-17: qty 1

## 2013-08-17 NOTE — Progress Notes (Signed)
Patient ID: Daniel Mayo, male   DOB: 17-Aug-1935, 78 y.o.   MRN: [id number] [facility] DAILY ICU PROGRESS NOTE                   [address]            [place] [address]          [phone]   3 Days Post-Op Procedure(s) (LRB): MINIMALLY INVASIVE MITRAL VALVE (MV)  REPAIR (Right) MINIMALLY INVASIVE MAZE PROCEDURE (N/A) INTRAOPERATIVE TRANSESOPHAGEAL ECHOCARDIOGRAM (N/A)  Total Length of Stay:  LOS: 3 days   Subjective: Ambulating in hall  Objective: Vital signs in last 24 hours: Temp:  [97.7 F (36.5 C)-98.4 F (36.9 C)] 98.2 F (36.8 C) (11/22 0734) Pulse Rate:  [62-83] 67 (11/22 0600) Cardiac Rhythm:  [-] Normal sinus rhythm (11/22 0400) Resp:  [10-30] 21 (11/22 0600) BP: (90-135)/(58-107) 134/77 mmHg (11/22 0600) SpO2:  [92 %-100 %] 97 % (11/22 0600) Arterial Line BP: (95-145)/(47-66) 120/57 mmHg (11/21 1200) Weight:  [244 lb 0.8 oz (110.7 kg)] 244 lb 0.8 oz (110.7 kg) (11/22 0500)  Filed Weights   08/15/13 0500 08/16/13 0600 08/17/13 0500  Weight: 256 lb 13.4 oz (116.5 kg) 246 lb 7.6 oz (111.8 kg) 244 lb 0.8 oz (110.7 kg)    Weight change: -2 lb 6.8 oz (-1.1 kg)   Hemodynamic parameters for last 24 hours:    Intake/Output from previous day: 11/21 0701 - 11/22 0700 In: 1282.4 [P.O.:720; I.V.:562.4] Out: 2735 [Urine:2685; Chest Tube:50]  Intake/Output this shift:    Current Meds: Scheduled Meds: . acetaminophen  1,000 mg Oral Q6H  . aspirin EC  81 mg Oral Daily  . bisacodyl  10 mg Oral Daily   Or  . bisacodyl  10 mg Rectal Daily  . Chlorhexidine Gluconate Cloth  6 each Topical Q0600  . docusate sodium  200 mg Oral Daily  . flecainide  50 mg Oral Daily  . furosemide  80 mg Oral Daily  . metoCLOPramide (REGLAN) injection  10 mg Intravenous Q6H  . mupirocin ointment  1 application Nasal BID  . pantoprazole  40 mg Oral Daily  . pantoprazole  40 mg Oral Daily  . sodium chloride  3 mL Intravenous Q12H  . Warfarin - Physician Dosing  Inpatient   Does not apply q1800   Continuous Infusions: . sodium chloride 20 mL/hr (08/15/13 0800)  . sodium chloride Stopped (08/16/13 1100)  . sodium chloride    . DOPamine Stopped (08/16/13 1000)  . lactated ringers 20 mL/hr (08/14/13 1700)  . nitroGLYCERIN Stopped (08/15/13 1500)  . phenylephrine (NEO-SYNEPHRINE) Adult infusion Stopped (08/14/13 1615)   PRN Meds:.metoprolol, morphine injection, ondansetron (ZOFRAN) IV, oxyCODONE, promethazine, sodium chloride  General appearance: alert and cooperative Neurologic: intact Heart: regular rate and rhythm, S1, S2 normal, no murmur, click, rub or gallop Lungs: clear to auscultation bilaterally and normal percussion bilaterally Abdomen: soft, non-tender; bowel sounds normal; no masses,  no organomegaly Extremities: extremities normal, atraumatic, no cyanosis or edema and Homans sign is negative, no sign of DVT Wound: stable  Lab Results: CBC: Recent Labs  08/16/13 0420 08/17/13 0400  WBC 14.7* 9.0  HGB 11.9* 10.4*  HCT 33.4* 29.0*  PLT 80* 68*   BMET:  Recent Labs  08/16/13 0420 08/17/13 0400  NA 137 137  K 4.7 3.7  CL 103 102  CO2 26 28  GLUCOSE 113* 87  BUN 26* 32*  CREATININE 1.97* 2.19*  CALCIUM 7.8* 8.1*  PT/INR:  Recent Labs  08/17/13 0400  LABPROT 15.4*  INR 1.25   Radiology: Dg Chest Port 1 View  08/16/2013   CLINICAL DATA:  Severe mitral regurgitation. Status post repair of the mitral valve with ring annuloplasty.  EXAM: PORTABLE CHEST - 1 VIEW  COMPARISON:  08/15/2013 and 08/14/2013  FINDINGS: Two chest tubes remain in place on the right. No pneumothorax. Improving atelectasis at both lung bases. Vascularity is normal. Cardiac silhouette is normal. Jugular vein sheath remains in place on the left. Swan-Ganz catheter has been removed.  IMPRESSION: Improving bibasilar atelectasis.  No pneumothorax.   Electronically Signed   By: Rozetta Nunnery M.D.   On: 08/16/2013 07:31   Acute Kidney Injury -Risk  on  chronic renal disease , cr to 2.19  Acute Kidney Injury (any one)  Increase in SCr by > 0.3 within 48 hours  Increase SCr to > 1.5 times baseline  Urine volume < 0.5 ml/kg/h for 6 hrs  Stage:  Risk:   1.5x increase in creatinine or GFR decrease by 25% or UOP <0.9ml/kgperhr for 6 hrs  Injury:  2x increase in creatinine or GFR decrease by 50% or UOP < 0.81ml/kgperhr for 12 hr  Failure:3X increase in creatinine or GFR decrease by 75% or UOP < 0.15ml/kgperhr for 12 hr or                anuria 12 hrs  Loss: complete loss of kidney  function for more then 4 weeks  End-stage renal disease:Complete loss of kidney function for more then 3 months   Assessment/Plan: S/P Procedure(s) (LRB): MINIMALLY INVASIVE MITRAL VALVE (MV)  REPAIR (Right) MINIMALLY INVASIVE MAZE PROCEDURE (N/A) INTRAOPERATIVE TRANSESOPHAGEAL ECHOCARDIOGRAM (N/A) Mobilize Plan for transfer to step-down: see transfer orders Thrombocytopenia, plt 68,000    Daniel Mayo 08/17/2013 7:52 AM

## 2013-08-17 NOTE — Progress Notes (Signed)
CARDIAC REHAB PHASE I   PRE:  Rate/Rhythm: 78 SR  BP:  Supine:   Sitting: 130/66  Standing:    SaO2: 95 1LNC  MODE:  Ambulation: 550  Ft x 1 standing rest break O2 sat 97  POST:  Rate/Rhythm: 83  BP:  Supine:   Sitting: 150/77   Standing:    SaO2: 95 1LNC  Assisted to ambulate x 1 assist and rolling walker. Pt tolerated well with no complaints.  Up to sink to brush teeth.  Call bell within reach. Cherre Huger, BSN [phone]

## 2013-08-18 ENCOUNTER — Inpatient Hospital Stay (HOSPITAL_COMMUNITY): Payer: Medicare PPO

## 2013-08-18 LAB — BASIC METABOLIC PANEL
BUN: 32 mg/dL — ABNORMAL HIGH (ref 6–23)
CO2: 30 mEq/L (ref 19–32)
Calcium: 8.4 mg/dL (ref 8.4–10.5)
Chloride: 102 mEq/L (ref 96–112)
Creatinine, Ser: 1.85 mg/dL — ABNORMAL HIGH (ref 0.50–1.35)
GFR calc Af Amer: 39 mL/min — ABNORMAL LOW (ref >90)
GFR calc non Af Amer: 34 mL/min — ABNORMAL LOW (ref >90)
Glucose, Bld: 97 mg/dL (ref 70–99)
Potassium: 3.5 mEq/L (ref 3.5–5.1)
Sodium: 141 mEq/L (ref 135–145)

## 2013-08-18 LAB — CBC
HCT: 30.4 % — ABNORMAL LOW (ref 39.0–52.0)
Hemoglobin: 10.5 g/dL — ABNORMAL LOW (ref 13.0–17.0)
MCH: 33.4 pg (ref 26.0–34.0)
MCHC: 34.5 g/dL (ref 30.0–36.0)
MCV: 96.8 fL (ref 78.0–100.0)
Platelets: 83 10*3/uL — ABNORMAL LOW (ref 150–400)
RBC: 3.14 MIL/uL — ABNORMAL LOW (ref 4.22–5.81)
RDW: 13.7 % (ref 11.5–15.5)
WBC: 8.3 10*3/uL (ref 4.0–10.5)

## 2013-08-18 LAB — GLUCOSE, CAPILLARY
Glucose-Capillary: 100 mg/dL — ABNORMAL HIGH (ref 70–99)
Glucose-Capillary: 106 mg/dL — ABNORMAL HIGH (ref 70–99)

## 2013-08-18 LAB — PROTIME-INR
INR: 1.19 (ref 0.00–1.49)
Prothrombin Time: 14.8 seconds (ref 11.6–15.2)

## 2013-08-18 MED ORDER — PNEUMOCOCCAL VAC POLYVALENT 25 MCG/0.5ML IJ INJ
0.5000 mL | INJECTION | INTRAMUSCULAR | Status: AC
  Administered 2013-08-19: 0.5 mL via INTRAMUSCULAR
  Filled 2013-08-18: qty 0.5

## 2013-08-18 MED ORDER — LACTULOSE 10 GM/15ML PO SOLN
30.0000 g | Freq: Every day | ORAL | Status: DC | PRN
  Filled 2013-08-18: qty 45

## 2013-08-18 MED ORDER — LOSARTAN POTASSIUM 50 MG PO TABS
50.0000 mg | ORAL_TABLET | Freq: Every day | ORAL | Status: DC
  Administered 2013-08-18 – 2013-08-19 (×2): 50 mg via ORAL
  Filled 2013-08-18 (×2): qty 1

## 2013-08-18 MED ORDER — WARFARIN SODIUM 5 MG PO TABS
5.0000 mg | ORAL_TABLET | Freq: Every day | ORAL | Status: DC
  Administered 2013-08-18: 5 mg via ORAL
  Filled 2013-08-18 (×2): qty 1

## 2013-08-18 NOTE — Discharge Summary (Signed)
Physician Discharge Summary  Patient ID: Daniel Mayo MRN: [id number] DOB/AGE: 10-28-1934 78 y.o.  Admit date: 08/14/2013 Discharge date: 08/19/2013  Admission Diagnoses:  Patient Active Problem List   Diagnosis Date Noted  . Tricuspid regurgitation   . Severe mitral regurgitation 07/15/2013  . A-fib 07/15/2013  . Chronic kidney disease (CKD)   . Chronic back pain   . Hypertension   . Hyperlipidemia   . Mitral regurgitation 07/12/2013  . Shortness of breath 10/21/2011  . AV BLOCK, 1ST DEGREE 04/13/2010  . Atrial fibrillation 02/17/2010   Discharge Diagnoses:   Patient Active Problem List   Diagnosis Date Noted  . S/P minimally invasive mitral valve repair + maze procedure 08/14/2013  . S/P Maze operation for atrial fibrillation 08/14/2013  . Tricuspid regurgitation   . Severe mitral regurgitation 07/15/2013  . A-fib 07/15/2013  . Chronic kidney disease (CKD)   . Chronic back pain   . Hypertension   . Hyperlipidemia   . Mitral regurgitation 07/12/2013  . Shortness of breath 10/21/2011  . AV BLOCK, 1ST DEGREE 04/13/2010  . Atrial fibrillation 02/17/2010   Discharged Condition: good  History of Present Illness:   Daniel Mayo is a 78 year old retired white male with long-standing history of mitral regurgitation and atrial fibrillation referred by Dr. Claudie Leach to [facility] for possible surgical intervention. The patient states that he was first told that he had a heart murmur during childhood. He states that he was severely ill with pneumonia and treated with bedrest for several months. He does not recall ever being told that he had rheumatic fever. He was told that ever since then he has had a heart murmur. For the last several years he has been followed at [facility] in [facility], most recently by Dr. Claudie Leach. In 2011 he developed persistent atrial fibrillation and was referred to Dr. Caryl Comes who performed DC cardioversion. The patient has been maintaining sinus  rhythm on oral flecainide. Despite maintaining sinus rhythm the patient has developed progressive exertional shortness of breath. In January 2013 he was referred for diagnostic cardiac catheterization. This was performed by Dr. Burt Knack and notable for the absence of significant coronary artery disease. At that time the patient was noted to have at least moderate mitral regurgitation although pulmonary artery pressures were not elevated. Since then the patient complains that he has developed further progression of exertional shortness of breath and fatigue. He underwent transesophageal echocardiogram on 07/11/2013 by Dr. Claudie Leach. He was found to have moderate left ventricular chamber enlargement with severe mitral regurgitation. 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 Roxy Manns on 07/15/2013 at which time it was felt patient would be a candidate for surgical intervention.  However Dr. Roxy Manns, would need to review the patient's Echocardiogram prior to surgery as well as the patient undergoing a cardiac gated CT scan. The risks and benefits of the procedure were explained to the patient and he was agreeable to proceed.  The patient again presented for follow up on 08/09/2013 at which time after review of the patient's Echocardiogram it was felt there would be a high likely-hood his valve could be repaired.  It was felt this could be done via a Minimally invasive approach and surgery was scheduled for 08/14/2013.   Hospital Course:   The patient presented to [facility] on 08/14/2013.  He was taken to the operating room and underwent Minimally Invasive Mitral Valve Repair and Complete MAZE  procedure.  The patient tolerated the procedure well and was taken to the SICU in stable condition.  The patient was extubated the evening of surgery.  During his stay in the ICU the patient the patient was weaned off cardiac drips as tolerated.   He was maintaining a junctional sinus rhythm.  His lopressor was discontinued and he was restarted on his home Flecainide.  The patient was aggressively diuresed with a Lasix drip with good results.  His chest tubes and arterial lines were removed without difficulty.  He was started on Coumadin at low dose.  Once medically stable he was transferred to the step down unit in stable condition.  The patient continues to progress.  He in Thrombocytopenic, which is slowly improving.  He continues to maintain NSR and his pacing wires were removed without difficulty.  His INR is 1.19, his dose will be increased accordingly with a goal range of 2.0-3.0.  He is ambulating without difficulty and tolerating a cardiac diet.  Should no further issues arise we anticipate discharge home in the next 24-48 hours.      Significant Diagnostic Studies:   ECHO: - Left ventricle: There was mild concentric hypertrophy. The estimated ejection fraction was 50%. - Aortic valve: Trivial regurgitation. - Mitral valve: There was moderate to severe mitral annular calcification. This was especially pronounced at the base of the posterior mitral leaflet. There was moderate systolic bowing of both leaflets without prolapse. This was especially evident in the A2-P2 region. There was a central jet of mitral regurgitation which was graded as moderate. - Left atrium: The atrium was dilated. No evidence of thrombus in the atrial cavity or appendage. No evidence of thrombus in the appendage. - Right ventricle: The cavity size was mild to moderately dilated. Wall thickness was normal. - Atrial septum: No defect or patent foramen ovale was identified.  Treatments: surgery:   Minimally-Invasive Mitral Valve Repair Sorin Memo 3D Ring Annuloplasty (size 68mm, catalog [id number], serial [id number])  Maze Procedure Complete bilateral atrial lesion set using cryothermy and bipolar radiofrequency ablation  Disposition: 01-Home or Self  Care  Discharge Medications:   Medication List    STOP taking these medications       mupirocin ointment 2 %  Commonly known as:  BACTROBAN     NITROSTAT 0.4 MG SL tablet  Generic drug:  nitroGLYCERIN     verapamil 120 MG tablet  Commonly known as:  CALAN      TAKE these medications       aspirin 81 MG tablet  Take 81 mg by mouth daily.     finasteride 5 MG tablet  Commonly known as:  PROSCAR  Take 5 mg by mouth daily.     flecainide 50 MG tablet  Commonly known as:  TAMBOCOR  Take 50 mg by mouth daily.     losartan 50 MG tablet  Commonly known as:  COZAAR  Take 50 mg by mouth daily.     omeprazole 20 MG capsule  Commonly known as:  PRILOSEC  Take 20 mg by mouth daily.     OVER THE COUNTER MEDICATION  Take 1 tablet by mouth 2 (two) times daily. Supplement for the Prostate - Prosvent     OVER THE COUNTER MEDICATION  Place 1 drop into both eyes 2 (two) times daily as needed (itching/allergies). Over the counter eye drops     oxyCODONE 5 MG immediate release tablet  Commonly known as:  Oxy IR/ROXICODONE  Take 1-2 tablets (5-10  mg total) by mouth every 4 (four) hours as needed for severe pain.     warfarin 5 MG tablet  Commonly known as:  COUMADIN  Take 1 tablet (5 mg total) by mouth daily at 6 PM. Or as directed.       The patient has been discharged on:   1.Beta Blocker:  Yes [   ]                              No   [ x  ]                              If No, reason: Junctional Bradycardia  2.Ace Inhibitor/ARB: Yes [  x ]                                     No  [   ]                                     If No, reason:  3.Statin:   Yes [   ]                  No  [ x  ]                  If No, reason: NO CAD, No Hyperlipidemia  4.Shela Commons:  Yes  [ x  ]                  No   [   ]                  If No, reason:   Follow-up Information   Follow up with Rexene Alberts, MD. (Office will contact you with appointment)    Specialty:  Cardiothoracic  Surgery   Contact information:   [place] [address] [phone]       Follow up with [place]. (please get CXR 1 hour prior to appointment)    Contact information:   [place]       Follow up with Franchot Mimes, MD. (please contact office to set up 2-4 week follow up)    Specialty:  Family Medicine   Contact information:   [facility] [place] [address] [phone]       Follow up with Franchot Mimes, MD. (Please call to have the PT and INR (to monitor Coumadin) drawn on Wednesday 08/21/2013)    Specialty:  Family Medicine   Contact information:   [address] [facility] [place] [address] [phone]       Signed: Nani Skillern PA-C 08/19/2013, 7:56 AM

## 2013-08-18 NOTE — Progress Notes (Addendum)
      [address]       [place] [address]             [phone]      4 Days Post-Op Procedure(s) (LRB): MINIMALLY INVASIVE MITRAL VALVE (MV)  REPAIR (Right) MINIMALLY INVASIVE MAZE PROCEDURE (N/A) INTRAOPERATIVE TRANSESOPHAGEAL ECHOCARDIOGRAM (N/A)  Subjective:  [name] has no complaints this morning.  He is hopeful to discharge soon.  He is ambulating without difficulty.  No BM,+ flatus  Objective: Vital signs in last 24 hours: Temp:  [97.2 F (36.2 C)-100.3 F (37.9 C)] 97.9 F (36.6 C) (11/23 0609) Pulse Rate:  [66-83] 83 (11/23 0609) Cardiac Rhythm:  [-] Normal sinus rhythm (11/22 2030) Resp:  [13-19] 18 (11/23 0609) BP: (123-160)/(60-80) 152/80 mmHg (11/23 0609) SpO2:  [95 %-100 %] 100 % (11/23 0609) Weight:  [236 lb 4.8 oz (107.185 kg)-244 lb 0.8 oz (110.7 kg)] 236 lb 4.8 oz (107.185 kg) (11/23 0609)  Intake/Output from previous day: 11/22 0701 - 11/23 0700 In: 430 [P.O.:390; I.V.:40] Out: 2325 [Urine:2325]  General appearance: alert, cooperative and no distress Heart: regular rate and rhythm Lungs: clear to auscultation bilaterally Abdomen: soft, non-tender; bowel sounds normal; no masses,  no organomegaly Extremities: edema trace Wound: clean and dry  Lab Results:  Recent Labs  08/17/13 0400 08/18/13 0518  WBC 9.0 8.3  HGB 10.4* 10.5*  HCT 29.0* 30.4*  PLT 68* 83*   BMET:  Recent Labs  08/17/13 0400 08/18/13 0518  NA 137 141  K 3.7 3.5  CL 102 102  CO2 28 30  GLUCOSE 87 97  BUN 32* 32*  CREATININE 2.19* 1.85*  CALCIUM 8.1* 8.4    PT/INR:  Recent Labs  08/18/13 0518  LABPROT 14.8  INR 1.19   ABG    Component Value Date/Time   PHART 7.315* 08/15/2013 0518   HCO3 20.9 08/15/2013 0518   TCO2 21 08/15/2013 1653   ACIDBASEDEF 5.0* 08/15/2013 0518   O2SAT 88.0 08/15/2013 0518   CBG (last 3)   Recent Labs  08/17/13 1636 08/17/13 2107 08/18/13 0555  GLUCAP 90 105* 90    Assessment/Plan: S/P  Procedure(s) (LRB): MINIMALLY INVASIVE MITRAL VALVE (MV)  REPAIR (Right) MINIMALLY INVASIVE MAZE PROCEDURE (N/A) INTRAOPERATIVE TRANSESOPHAGEAL ECHOCARDIOGRAM (N/A)  1. CV- NSR, hypertensive- will restart home Cozaar 2. Pulm- no acute issues, off oxygen, continue IS 3. Renal- CKD, creatinine at baseline, weight is below baseline, not on diuretic 4. Thrombocytopenia- plt count improving, continue to hold heparin agents 5. LOC Constipation- Lactulose prn 6. INR 1.19, will increase dose to 5 mg daily 7. Dispo- patient stable, will d/c EPW today, possibly home in AM   LOS: 4 days    BARRETT, ERIN 08/18/2013  Increase coumadin dose wires out today I have seen and examined Daniel Mayo and agree with the above assessment  and plan.  Grace Isaac MD Beeper [phone] Office [phone] 08/18/2013 1:05 PM

## 2013-08-18 NOTE — Progress Notes (Signed)
dc'ed pacing wires per unit protacall

## 2013-08-18 NOTE — Progress Notes (Signed)
Pt walked 600 ft. X 3

## 2013-08-19 LAB — GLUCOSE, CAPILLARY: Glucose-Capillary: 89 mg/dL (ref 70–99)

## 2013-08-19 LAB — PROTIME-INR
INR: 1.2 (ref 0.00–1.49)
Prothrombin Time: 14.9 seconds (ref 11.6–15.2)

## 2013-08-19 MED ORDER — OXYCODONE HCL 5 MG PO TABS: 5.0000 mg | ORAL_TABLET | ORAL | Status: DC | PRN

## 2013-08-19 MED ORDER — WARFARIN SODIUM 5 MG PO TABS: 5.0000 mg | ORAL_TABLET | Freq: Every day | ORAL | Status: DC

## 2013-08-19 NOTE — Progress Notes (Addendum)
      [address]       [place] [address]             [phone]        5 Days Post-Op Procedure(s) (LRB): MINIMALLY INVASIVE MITRAL VALVE (MV)  REPAIR (Right) MINIMALLY INVASIVE MAZE PROCEDURE (N/A) INTRAOPERATIVE TRANSESOPHAGEAL ECHOCARDIOGRAM (N/A)  Subjective: Patient without complaints. He wants to go home.  Objective: Vital signs in last 24 hours: Temp:  [98 F (36.7 C)-100.2 F (37.9 C)] 98.6 F (37 C) (11/24 [id number]) Pulse Rate:  [82-90] 86 (11/24 [id number]) Cardiac Rhythm:  [-] Junctional rhythm (11/23 2007) Resp:  [18-20] 18 (11/24 [id number]) BP: (112-147)/(65-85) 143/85 mmHg (11/24 0638) SpO2:  [90 %-98 %] 92 % (11/24 [id number]) Weight:  [107.321 kg (236 lb 9.6 oz)-107.85 kg (237 lb 12.3 oz)] 107.321 kg (236 lb 9.6 oz) (11/24 [id number])  Pre op weight  109.3 kg Current Weight  08/19/13 107.321 kg (236 lb 9.6 oz)      Intake/Output from previous day: 11/23 0701 - 11/24 0700 In: 240 [P.O.:240] Out: -    Physical Exam:  Cardiovascular: RRR, no murmurs, gallops, or rubs. Pulmonary: Clear to auscultation bilaterally; no rales, wheezes, or rhonchi. Abdomen: Soft, non tender, bowel sounds present. Extremities: No  lower extremity edema. Wounds: Clean and dry.  No erythema or signs of infection.  Lab Results: CBC: Recent Labs  08/17/13 0400 08/18/13 0518  WBC 9.0 8.3  HGB 10.4* 10.5*  HCT 29.0* 30.4*  PLT 68* 83*   BMET:  Recent Labs  08/17/13 0400 08/18/13 0518  NA 137 141  K 3.7 3.5  CL 102 102  CO2 28 30  GLUCOSE 87 97  BUN 32* 32*  CREATININE 2.19* 1.85*  CALCIUM 8.1* 8.4    PT/INR:  Lab Results  Component Value Date   INR 1.19 08/18/2013   INR 1.25 08/17/2013   INR 1.22 08/16/2013   ABG:  INR: Will add last result for INR, ABG once components are confirmed Will add last 4 CBG results once components are confirmed  Assessment/Plan:  1. CV - SR. On Cozaar 50 daily and Coumadin. INR decreased from 1.25 to 1.19. Continue  Coumadin 5 as was on 2.5. 2.  Pulmonary - On room air. Encourage incentive spirometer 3.  Acute blood loss anemia - Last H and H stable at 10.5 and 30.4 4. Thrombocytopenia - Last platelet count up to 83,000 5. Likely discharge today  ZIMMERMAN,DONIELLE MPA-C 08/19/2013,7:44 AM   Chart reviewed, patient examined, agree with above. He looks good. He can go home today. He will need INR followed up on Wednesday before the holiday weekend.

## 2013-08-19 NOTE — Progress Notes (Signed)
[id number] Cardiac Rehab Completed discharge education with pt. He voices understanding. Pt agrees to [facility]. CRP in [place], will send referral. Deon Pilling 11:03 AM 08/19/2013

## 2013-09-03 ENCOUNTER — Other Ambulatory Visit: Payer: Self-pay | Admitting: Physician Assistant

## 2013-09-06 ENCOUNTER — Other Ambulatory Visit: Payer: Self-pay | Admitting: *Deleted

## 2013-09-06 DIAGNOSIS — I059 Rheumatic mitral valve disease, unspecified: Secondary | ICD-10-CM

## 2013-09-09 ENCOUNTER — Ambulatory Visit: Payer: Medicare PPO

## 2013-09-09 ENCOUNTER — Other Ambulatory Visit: Payer: Self-pay | Admitting: Physician Assistant

## 2013-09-10 ENCOUNTER — Ambulatory Visit (INDEPENDENT_AMBULATORY_CARE_PROVIDER_SITE_OTHER): Payer: Self-pay | Admitting: Thoracic Surgery (Cardiothoracic Vascular Surgery)

## 2013-09-10 ENCOUNTER — Ambulatory Visit
Admission: RE | Admit: 2013-09-10 | Discharge: 2013-09-10 | Disposition: A | Discharge status: Home / Self Care | Payer: Medicare PPO | Source: Ambulatory Visit | Attending: Thoracic Surgery (Cardiothoracic Vascular Surgery) | Admitting: Thoracic Surgery (Cardiothoracic Vascular Surgery)

## 2013-09-10 ENCOUNTER — Encounter: Payer: Self-pay | Admitting: Thoracic Surgery (Cardiothoracic Vascular Surgery)

## 2013-09-10 VITALS — BP 128/78 | HR 88 | Resp 16 | Ht 74.0 in | Wt 225.0 lb

## 2013-09-10 DIAGNOSIS — I4891 Unspecified atrial fibrillation: Secondary | ICD-10-CM

## 2013-09-10 DIAGNOSIS — Z8679 Personal history of other diseases of the circulatory system: Secondary | ICD-10-CM

## 2013-09-10 DIAGNOSIS — I340 Nonrheumatic mitral (valve) insufficiency: Secondary | ICD-10-CM

## 2013-09-10 DIAGNOSIS — I059 Rheumatic mitral valve disease, unspecified: Secondary | ICD-10-CM

## 2013-09-10 DIAGNOSIS — Z9889 Other specified postprocedural states: Secondary | ICD-10-CM | POA: Insufficient documentation

## 2013-09-10 NOTE — Patient Instructions (Signed)
The patient may continue to gradually increase their physical activity as tolerated.  They should refrain from any heavy lifting or strenuous use of their arms and shoulders until at least 8 weeks from the time of their surgery, and they should avoid activities that cause increased pain in their chest on the side of their surgical incision.  Otherwise they may continue to increase their activities without any particular limitations.  The patient may return to driving an automobile as long as they are no longer requiring oral narcotic pain relievers during the daytime.  It would be wise to start driving only short distances during the daylight and gradually increase from there as they feel comfortable.  

## 2013-09-10 NOTE — Progress Notes (Signed)
[address]       [place] [address]             [phone]     CARDIOTHORACIC SURGERY OFFICE NOTE  Referring Provider is Franchot Mimes, MD PCP is Franchot Mimes, MD   HPI:  Patient returns for routine followup status post minimally invasive mitral valve repair and Maze procedure on 08/14/2013. His postoperative recovery has been entirely uncomplicated. Since hospital discharge the patient has been seen in followup by Dr. Claudie Leach on several occasions who has also been monitoring his Coumadin dosing. He is scheduled to return to see Dr. Claudie Leach later this week and he returns to our office today for routine followup. The patient reports that he is doing exceptionally well.  He states that early after surgery he noticed a difference in his breathing, particularly while walking. He notes that his breathing capacity is already improved and his exercise tolerance is improving quickly.  He does not get short of breath anywhere near as easily as he did prior to surgery. He still has some soreness in the right side of his chest related to his surgery, and he notes that this became acutely exacerbated last week when he had a particularly severe coughing spell. However, he is not using any sort of pain relievers and he states that the pain is really not that bad. His appetite is good. He's not sleeping very well.  Overall he is getting along nicely and he is very pleased with his progress. He has not had any tachypalpitations or dizzy spells.   Current Outpatient Prescriptions  Medication Sig Dispense Refill  . finasteride (PROSCAR) 5 MG tablet Take 5 mg by mouth daily.      . flecainide (TAMBOCOR) 50 MG tablet Take 50 mg by mouth daily.      . nitroGLYCERIN (NITROSTAT) 0.4 MG SL tablet Place 0.4 mg under the tongue every 5 (five) minutes as needed for chest pain.      Marland Kitchen omeprazole (PRILOSEC) 20 MG capsule Take 20 mg by mouth daily.       Marland Kitchen OVER THE COUNTER MEDICATION  Place 1 drop into both eyes 2 (two) times daily as needed (itching/allergies). Over the counter eye drops      . warfarin (COUMADIN) 5 MG tablet Take 1 tablet (5 mg total) by mouth daily at 6 PM. Or as directed.  30 tablet  1  . OVER THE COUNTER MEDICATION Take 1 tablet by mouth 2 (two) times daily. Supplement for the Prostate - Prosvent       No current facility-administered medications for this visit.      Physical Exam:   BP 128/78  Pulse 88  Resp 16  Ht 6\' 2"  (1.88 m)  Wt 225 lb (102.059 kg)  BMI 28.88 kg/m2  SpO2 99%  General:  Well-appearing  Chest:   Clear to auscultation with symmetrical breath sounds  CV:   Regular rate and rhythm without murmur  Incisions:  Clean and dry and healing nicely  Abdomen:  Soft and nontender  Extremities:  Warm and well-perfused  Diagnostic Tests:  2 channel telemetry rhythm strip demonstrates normal sinus rhythm  CLINICAL DATA: Right chest pain  EXAM:  CHEST 2 VIEW  COMPARISON: 08/18/2013  FINDINGS:  Cardiomediastinal silhouette is stable. Stable atelectasis or  scarring in right lower lobe. Stable left basilar atelectasis or  scarring. No acute infiltrate or pulmonary edema. Mild degenerative  changes thoracic spine.  IMPRESSION:  No active disease. Stable atelectasis or scarring in right lower  lobe and left base.  Electronically Signed  By: Lahoma Crocker M.D.  On: 09/10/2013 09:46    Impression:  The patient is doing very well less than 4 weeks status post minimally invasive mitral valve repair and Maze procedure. He is maintaining sinus rhythm. His exercise tolerance is improving quickly.    Plan:  I've encouraged patient to continue to gradually increase his physical activity with limitations primarily related to use of his arms and shoulders. I've cautioned him that any sort of heavy lifting or pulling or pushing with his arms and shoulders may exacerbate the discomfort in his chest. Otherwise he may continue to increase  his activity without any particular limitations. I think he is ready to resume driving an automobile. I suggested that he try taking Naprosyn or ibuprofen to alleviate the soreness in his chest. We have otherwise not made any changes in his current medications. He is eager to stop taking Coumadin. I've recommended that he stay on Coumadin for at least another 2 months.  The patient does not wish to participate in the formal cardiac rehabilitation program. We have discussed a plan for his physical rehabilitation on his own.  He will continue to followup with Dr. Claudie Leach and we will plan to see him back in our office in 2 months for routine followup and rhythm check.   Valentina Gu. Roxy Manns, MD 09/10/2013 10:57 AM

## 2013-11-11 ENCOUNTER — Encounter: Payer: Self-pay | Admitting: Thoracic Surgery (Cardiothoracic Vascular Surgery)

## 2013-11-11 ENCOUNTER — Ambulatory Visit (INDEPENDENT_AMBULATORY_CARE_PROVIDER_SITE_OTHER): Payer: Self-pay | Admitting: Thoracic Surgery (Cardiothoracic Vascular Surgery)

## 2013-11-11 VITALS — BP 155/91 | HR 85 | Resp 20 | Ht 74.5 in | Wt 232.0 lb

## 2013-11-11 DIAGNOSIS — I059 Rheumatic mitral valve disease, unspecified: Secondary | ICD-10-CM

## 2013-11-11 NOTE — Progress Notes (Signed)
[address]       ,[place] [address]             [phone]     CARDIOTHORACIC SURGERY OFFICE NOTE  Referring Provider is Franchot Mimes, MD PCP is Franchot Mimes, MD   HPI:  Patient returns for routine followup status post minimally invasive mitral valve repair and Maze procedure on 08/14/2013. His postoperative recovery was entirely uncomplicated. He was last seen here in our office on 09/10/2013.  Since then he has also been seen in followup by Dr. Claudie Leach, who also has been adjusting his Coumadin dosing. The patient has been maintaining sinus rhythm and overall doing remarkably well. He walks every day and has made considerable progress with his exercise tolerance. He has no pain in his chest. He reports no shortness of breath unless he really pushes himself, such as walking up a hill. He has not had any tachypalpitations. He has not had any problems with Coumadin therapy.    Current Outpatient Prescriptions  Medication Sig Dispense Refill  . finasteride (PROSCAR) 5 MG tablet Take 5 mg by mouth daily. Every other day      . flecainide (TAMBOCOR) 50 MG tablet Take 50 mg by mouth daily.      . nitroGLYCERIN (NITROSTAT) 0.4 MG SL tablet Place 0.4 mg under the tongue every 5 (five) minutes as needed for chest pain.      Marland Kitchen omeprazole (PRILOSEC) 20 MG capsule Take 20 mg by mouth daily.       . verapamil (CALAN) 120 MG tablet Take 120 mg by mouth once.      . warfarin (COUMADIN) 5 MG tablet Take 1 tablet (5 mg total) by mouth daily at 6 PM. Or as directed.  30 tablet  1   No current facility-administered medications for this visit.      Physical Exam:   BP 155/91  Pulse 85  Resp 20  Ht 6' 2.5" (1.892 m)  Wt 232 lb (105.235 kg)  BMI 29.40 kg/m2  SpO2 98%  General:  Well-appearing  Chest:   Clear to auscultation  CV:   Regular rate and rhythm without murmur  Incisions:  Completely healed  Abdomen:  Soft nontender  Extremities:  Warm and  well-perfused  Diagnostic Tests:  2 channel telemetry rhythm strip demonstrates normal sinus rhythm  Report of transthoracic echocardiogram performed 09/13/2013 is reviewed. By report there was normal left ventricular size and systolic function with ejection fraction estimated 60-65%. There was intact mitral valve repair with only "trace" mitral regurgitation.   Impression:  The patient is doing quite well 3 months following minimally invasive mitral valve repair and Maze procedure. He is maintaining sinus rhythm. His exercise tolerance is improving nicely, and overall quite good.  Plan:  I've encouraged patient to continue to increase his physical activity as tolerated without any particular limitations at this time. At some point it would be reasonable to consider stopping Coumadin long as the patient continues to maintain sinus rhythm. We will leave this to Dr. Verdon Cummins discretion.  I have instructed the patient to start taking aspirin if and when he is taken off of Coumadin.  I've also reminded the patient regarding the importance for lifelong antibiotic prophylaxis for all dental cleaning and procedures to prevent endocarditis. All of his questions have been addressed. We will plan to see him back in 3 months for routine followup and rhythm check.   Valentina Gu. Roxy Manns, MD 11/11/2013  3:04 PM

## 2013-11-11 NOTE — Patient Instructions (Signed)
Patient may resume normal physical activity without any particular limitations at this time, and return to our office only as needed should any further problems or questions arise.   Endocarditis is a potentially serious infection of heart valves or inside lining of the heart.  It occurs more commonly in patients with diseased heart valves (such as patient's with aortic or mitral valve disease) and in patients who have undergone heart valve repair or replacement.  Certain surgical and dental procedures may put you at risk, such as dental cleaning, other dental procedures, or any surgery involving the respiratory, urinary, gastrointestinal tract, gallbladder or prostate gland.   To minimize your chances for develooping endocarditis, maintain good oral health and seek prompt medical attention for any infections involving the mouth, teeth, gums, skin or urinary tract.  Always notify your doctor or dentist about your underlying heart valve condition before having any invasive procedures. You will need to take antibiotics before certain procedures.

## 2014-02-10 ENCOUNTER — Encounter: Payer: Self-pay | Admitting: Thoracic Surgery (Cardiothoracic Vascular Surgery)

## 2014-02-10 ENCOUNTER — Ambulatory Visit (INDEPENDENT_AMBULATORY_CARE_PROVIDER_SITE_OTHER): Payer: Medicare PPO | Admitting: Thoracic Surgery (Cardiothoracic Vascular Surgery)

## 2014-02-10 VITALS — BP 151/77 | HR 77 | Resp 16 | Ht 74.5 in | Wt 234.0 lb

## 2014-02-10 DIAGNOSIS — I340 Nonrheumatic mitral (valve) insufficiency: Secondary | ICD-10-CM

## 2014-02-10 DIAGNOSIS — Z9889 Other specified postprocedural states: Secondary | ICD-10-CM

## 2014-02-10 DIAGNOSIS — I4891 Unspecified atrial fibrillation: Secondary | ICD-10-CM

## 2014-02-10 DIAGNOSIS — Z8679 Personal history of other diseases of the circulatory system: Secondary | ICD-10-CM

## 2014-02-10 DIAGNOSIS — I059 Rheumatic mitral valve disease, unspecified: Secondary | ICD-10-CM

## 2014-02-10 NOTE — Patient Instructions (Signed)

## 2014-02-10 NOTE — Progress Notes (Signed)
      [address]       [place] [address]             [phone]     CARDIOTHORACIC SURGERY OFFICE NOTE  Referring Provider is Franchot Mimes, MD PCP is Franchot Mimes, MD   HPI:  Patient returns for routine followup status post minimally invasive mitral valve repair and Maze procedure on 08/14/2013. He was last seen here in our office on 11/11/2013. Since then he has continued to do very well. He states that his exercise tolerance has continued to gradually improve. He now get short of breath only with strenuous physical exertion. He states that this is much better than it was prior to surgery. His biggest complaint currently is that he gets cramps in his legs at night when he sleeps. He denies any pain in his legs with ambulation. He has not had any palpitations or other clinical signs to suggest recurrence of atrial fibrillation. He denies any PND, orthopnea, or lower extremity edema. He is hopeful to be able to stop anticoagulation therapy. He injured his right arm recently and had considerable problems with bleeding from the injury.    Current Outpatient Prescriptions  Medication Sig Dispense Refill  . Cholecalciferol (D3-1000) 1000 UNITS capsule Take 1,000 Units by mouth daily.      . finasteride (PROSCAR) 5 MG tablet Take 5 mg by mouth daily. Every other day      . flecainide (TAMBOCOR) 50 MG tablet Take 50 mg by mouth daily.      . Multiple Vitamins-Minerals (CENTRUM ADULTS PO) Take 1 tablet by mouth daily.      . nitroGLYCERIN (NITROSTAT) 0.4 MG SL tablet Place 0.4 mg under the tongue every 5 (five) minutes as needed for chest pain.      Marland Kitchen omeprazole (PRILOSEC) 20 MG capsule Take 20 mg by mouth daily.       . rivaroxaban (XARELTO) 20 MG TABS tablet Take 20 mg by mouth daily with supper.      . verapamil (CALAN) 120 MG tablet Take 120 mg by mouth once.       No current facility-administered medications for this visit.      Physical Exam:   BP  151/77  Pulse 77  Resp 16  Ht 6' 2.5" (1.892 m)  Wt 234 lb (106.142 kg)  BMI 29.65 kg/m2  SpO2 98%  General:  Well-appearing  Chest:   Clear  CV:   Regular rate and rhythm without murmur  Incisions:  Completely healed  Abdomen:  Soft  Extremities:  Warm, no edema  Diagnostic Tests:  2 channel telemetry rhythm strip demonstrates normal sinus rhythm   Impression:  Patient is clinically doing very well now 6 months following minimally invasive mitral valve repair and Maze procedure. He is maintaining sinus rhythm. His exercise tolerance is good.  Plan:  I think it might be reasonable to consider stopping Xarelto, but I will defer this decision to Dr. Verdon Cummins discretion.  The patient has been reminded regarding the need for antibiotic prophylaxis for all potential cleaning and procedures. He will return in 6 months for routine followup and rhythm check.  I spent in excess of 15 minutes during the conduct of this office consultation and >50% of this time involved direct face-to-face encounter with the patient for counseling and/or coordination of their care.    Valentina Gu. Roxy Manns, MD 02/10/2014 1:14 PM

## 2014-08-18 ENCOUNTER — Encounter: Payer: Self-pay | Admitting: Thoracic Surgery (Cardiothoracic Vascular Surgery)

## 2014-08-18 ENCOUNTER — Ambulatory Visit (INDEPENDENT_AMBULATORY_CARE_PROVIDER_SITE_OTHER): Payer: Medicare PPO | Admitting: Thoracic Surgery (Cardiothoracic Vascular Surgery)

## 2014-08-18 VITALS — BP 154/92 | HR 79 | Resp 16 | Ht 74.5 in | Wt 230.0 lb

## 2014-08-18 DIAGNOSIS — Z9889 Other specified postprocedural states: Secondary | ICD-10-CM

## 2014-08-18 DIAGNOSIS — Z8679 Personal history of other diseases of the circulatory system: Secondary | ICD-10-CM

## 2014-08-18 NOTE — Progress Notes (Signed)
[address]       [place] [address]             [phone]     CARDIOTHORACIC SURGERY OFFICE NOTE  Referring Provider is Franchot Mimes, MD PCP is Franchot Mimes, MD   HPI:  Patient returns for routine followup status post minimally invasive mitral valve repair and Maze procedure on 08/14/2013. He underwent routine follow-up echocardiogram last December which demonstrated intact mitral valve repair and normal left ventricular systolic function. He was last seen here in our office on 02/10/2014. Since then he has continued to do very well from a cardiac standpoint. He has been followed carefully by Dr. Claudie Leach and taken off of anticoagulation using Xarelto.  The patient is now taking aspirin on a daily basis. He remains on flecainide although he has not had any clinical recurrence of atrial fibrillation or atrial flutter.The patient reports that he is doing very well. He has stable mild symptoms of exertional shortness of breath. He only gets short of breath with strenuous physical activity and this does not limit his daily activities whatsoever. His breathing is notably much better than it was prior to surgery. He has not had any exertional chest discomfort. His physical activity is limited primarily by chronic back pain. He denies any palpitations or dizzy spells. The remainder of his review of systems is unremarkable.   Current Outpatient Prescriptions  Medication Sig Dispense Refill  . aspirin EC 81 MG tablet Take 81 mg by mouth daily.    . Cholecalciferol (D3-1000) 1000 UNITS capsule Take 1,000 Units by mouth daily.    . finasteride (PROSCAR) 5 MG tablet Take 5 mg by mouth daily. Every other day    . flecainide (TAMBOCOR) 50 MG tablet Take 50 mg by mouth daily.    . Multiple Vitamins-Minerals (CENTRUM ADULTS PO) Take 1 tablet by mouth daily.    . nitroGLYCERIN (NITROSTAT) 0.4 MG SL tablet Place 0.4 mg under the tongue every 5 (five) minutes as needed  for chest pain.    Marland Kitchen omeprazole (PRILOSEC) 20 MG capsule Take 20 mg by mouth daily.     . verapamil (CALAN) 120 MG tablet Take 120 mg by mouth once.     No current facility-administered medications for this visit.      Physical Exam:   BP 154/92 mmHg  Pulse 79  Resp 16  Ht 6' 2.5" (1.892 m)  Wt 230 lb (104.327 kg)  BMI 29.14 kg/m2  SpO2 97%  General:  Well-appearing  Chest:   Clear  CV:   Regular rate and rhythm without murmur  Incisions:  Completely healed  Abdomen:  Soft nontender  Extremities:  Warm and well-perfused  Diagnostic Tests:  2 channel telemetry rhythm strip demonstrates normal sinus rhythm   Impression:  Patient is doing very well approximately one year status post minimally invasive mitral valve repair and Maze procedure. He is maintaining sinus rhythm on oral flecainide therapy. He describes stable symptoms of exertional shortness of breath consistent with chronic diastolic congestive heart failure, [place] Heart Association functional class 1-2. He feels much better than he did prior to surgery. Postoperative echocardiogram performed last December looked quite good with intact mitral valve repair and normal left ventricular systolic function. Repeat follow-up echocardiogram has been scheduled for next month.    Plan:  I have reminded the patient regarding the lifelong need for antibiotic prophylaxis for all dental cleaning and related procedures. I think it  might be reasonable to consider stopping flecainide, but we will defer this decision to Dr. Verdon Cummins discretion.  The patient will be referred to the atrial fibrillation clinic for long-term rhythm surveillance. In the future he will call and return to see [place] as needed. All of his questions of been addressed.   I spent in excess of 15 minutes during the conduct of this office consultation and >50% of this time involved direct face-to-face encounter with the patient for counseling and/or coordination of  their care.  Valentina Gu. Roxy Manns, MD 08/18/2014 12:10 PM

## 2014-08-18 NOTE — Patient Instructions (Signed)

## 2014-09-04 ENCOUNTER — Encounter (HOSPITAL_COMMUNITY): Payer: Self-pay | Admitting: Cardiovascular Disease

## 2014-09-23 IMAGING — CR DG CHEST 2V
2 series · 2 of 2 positions shown · non-contrast
Comparison: Cardiac CT dated July 31, 2013.

CLINICAL DATA: History of hypertension, preop for cardiac surgery.

EXAM:
CHEST  2 VIEW

[w chest pa]
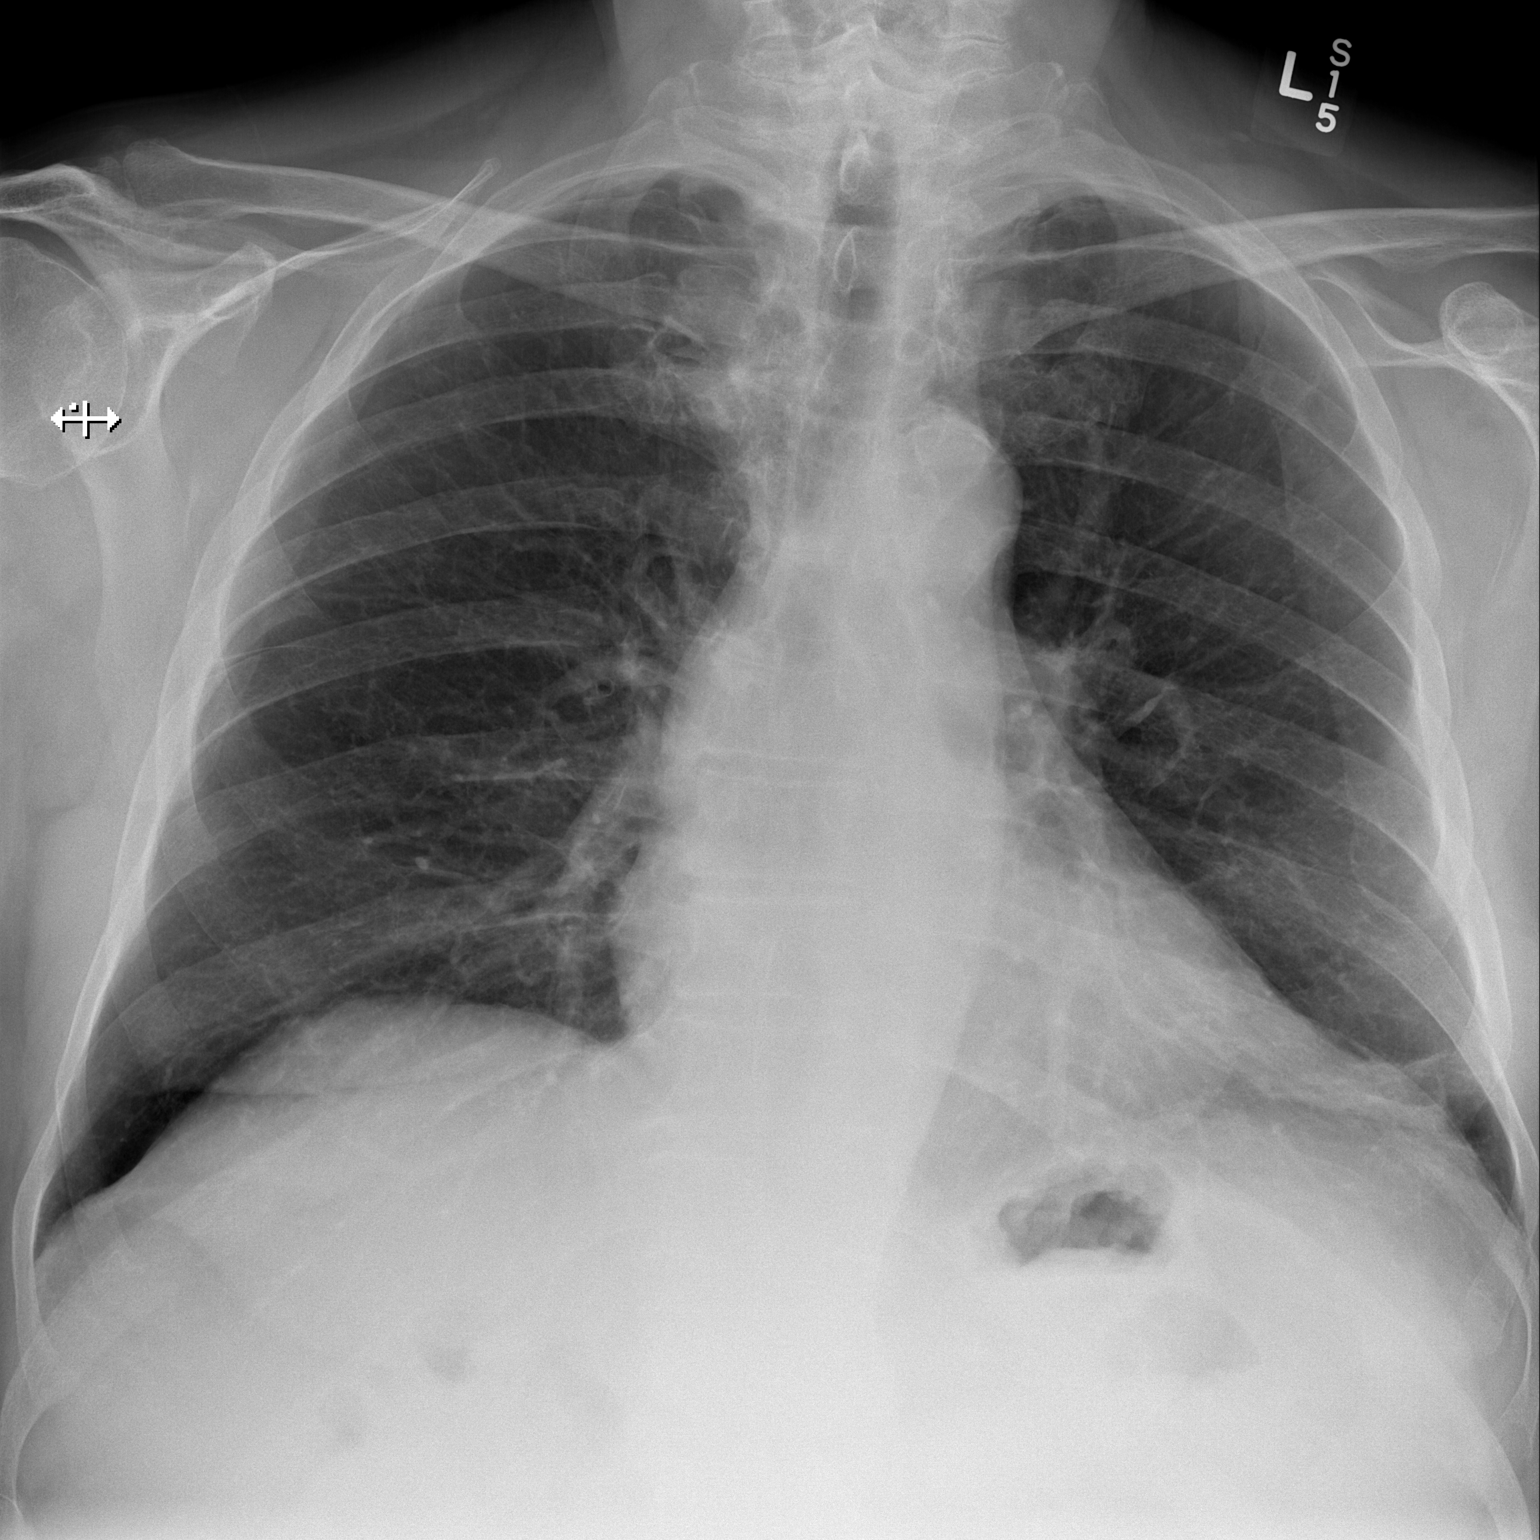

[w chest lat]
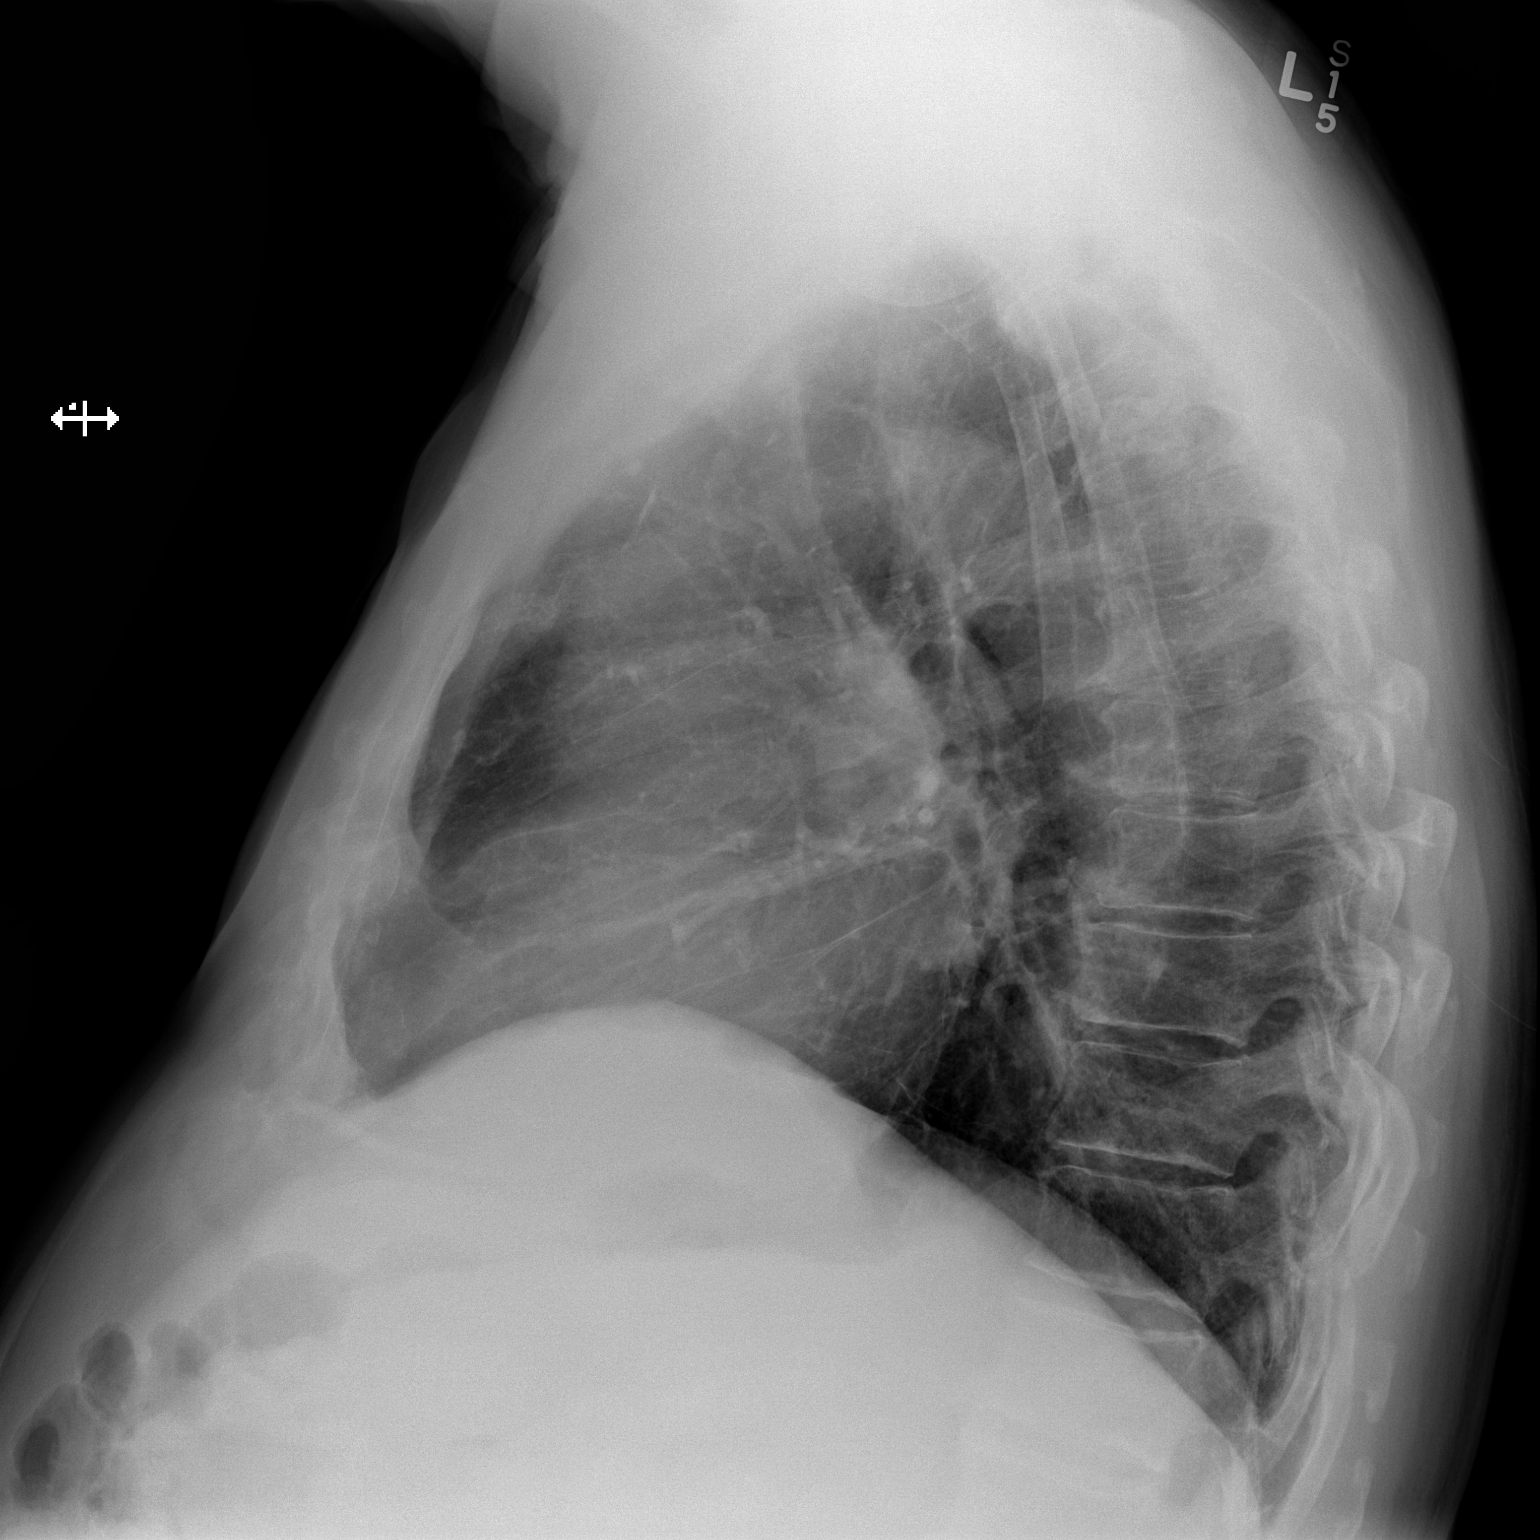

[2 of 2 positions shown; findings below may reference images not displayed]

FINDINGS: The lungs are well-expanded. There is stable atelectasis versus
fibrosis just lateral to the cardiac apex. The cardiac silhouette is
normal in size. The pulmonary vascularity is not engorged. There is
mild tortuosity of the descending thoracic aorta. The observed
portions of the bony thorax exhibit no acute abnormalities.
IMPRESSION: There is mild stable atelectasis versus fibrosis at the left lung
base. There is no evidence of pneumonia nor CHF.

## 2014-09-30 IMAGING — DX DG CHEST 1V PORT
1 series · 1 of 1 positions shown · non-contrast
Comparison: 08/15/2013 and 08/14/2013

CLINICAL DATA: Severe mitral regurgitation. Status post repair of
the mitral valve with ring annuloplasty.

EXAM:
PORTABLE CHEST - 1 VIEW

[portable]
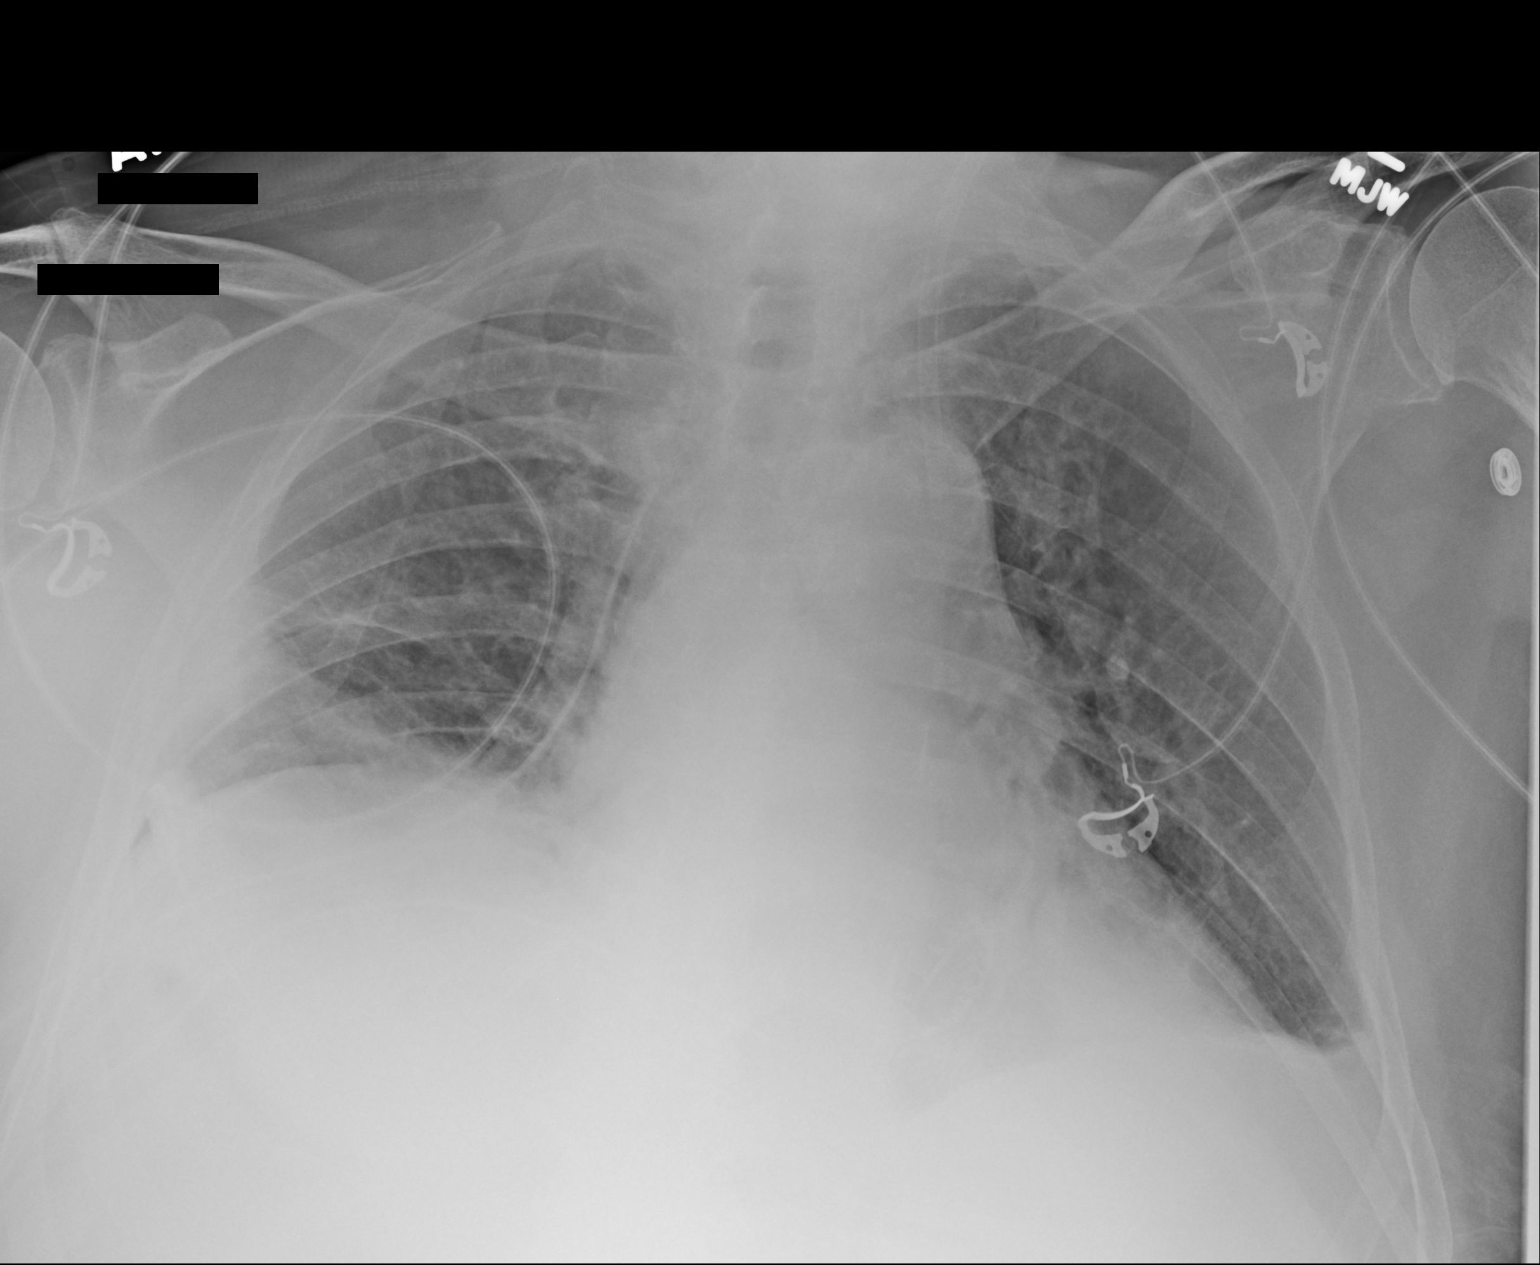

[1 of 1 positions shown; findings below may reference images not displayed]

FINDINGS: Two chest tubes remain in place on the right. No pneumothorax.
Improving atelectasis at both lung bases. Vascularity is normal.
Cardiac silhouette is normal. Jugular vein sheath remains in place
on the left. Swan-Ganz catheter has been removed.
IMPRESSION: Improving bibasilar atelectasis.  No pneumothorax.

## 2014-10-02 IMAGING — CR DG CHEST 2V
2 series · 2 of 2 positions shown · non-contrast
Comparison: Chest x-ray 08/17/2013.

CLINICAL DATA: Postoperative film.

EXAM:
CHEST  2 VIEW

[w chest pa]
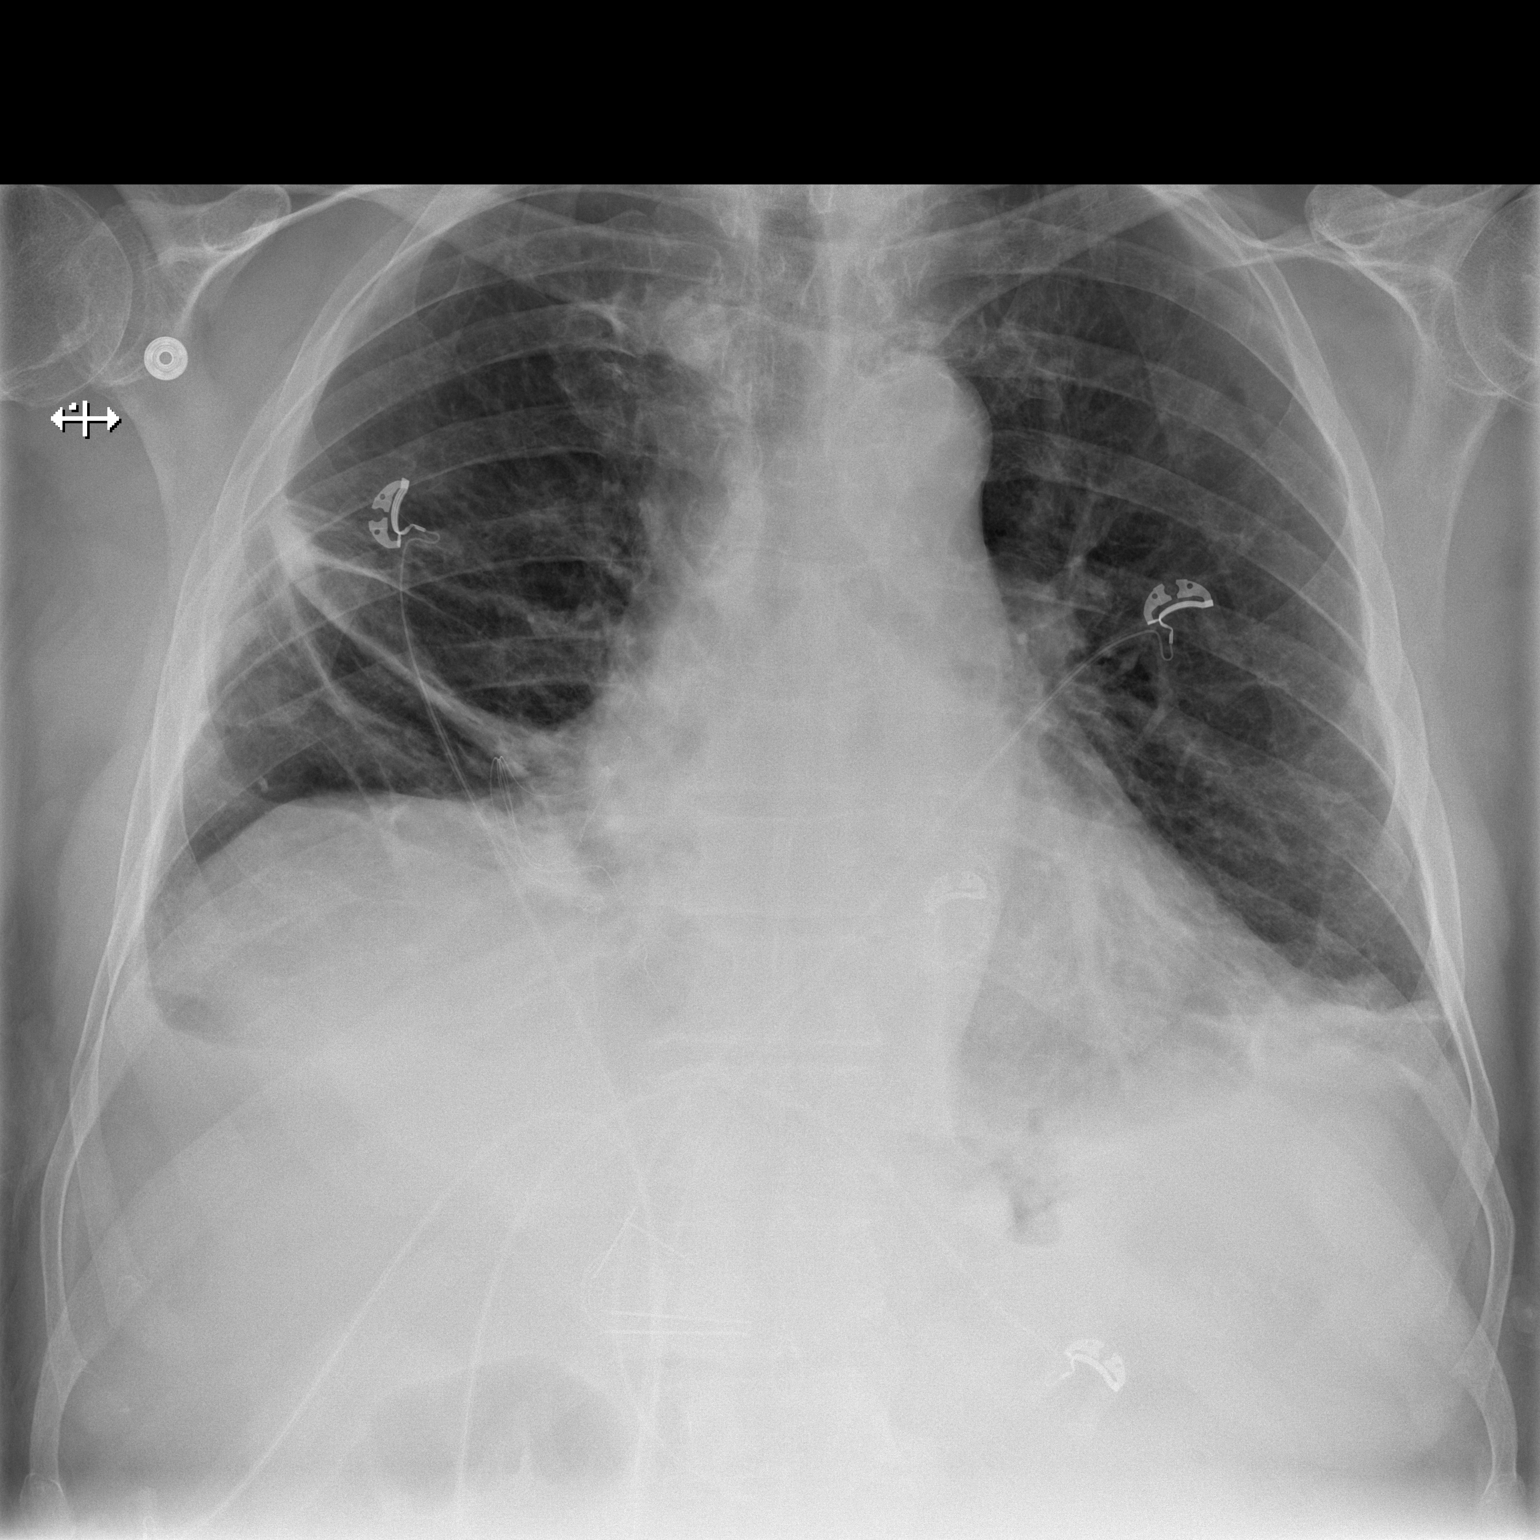

[w chest lat]
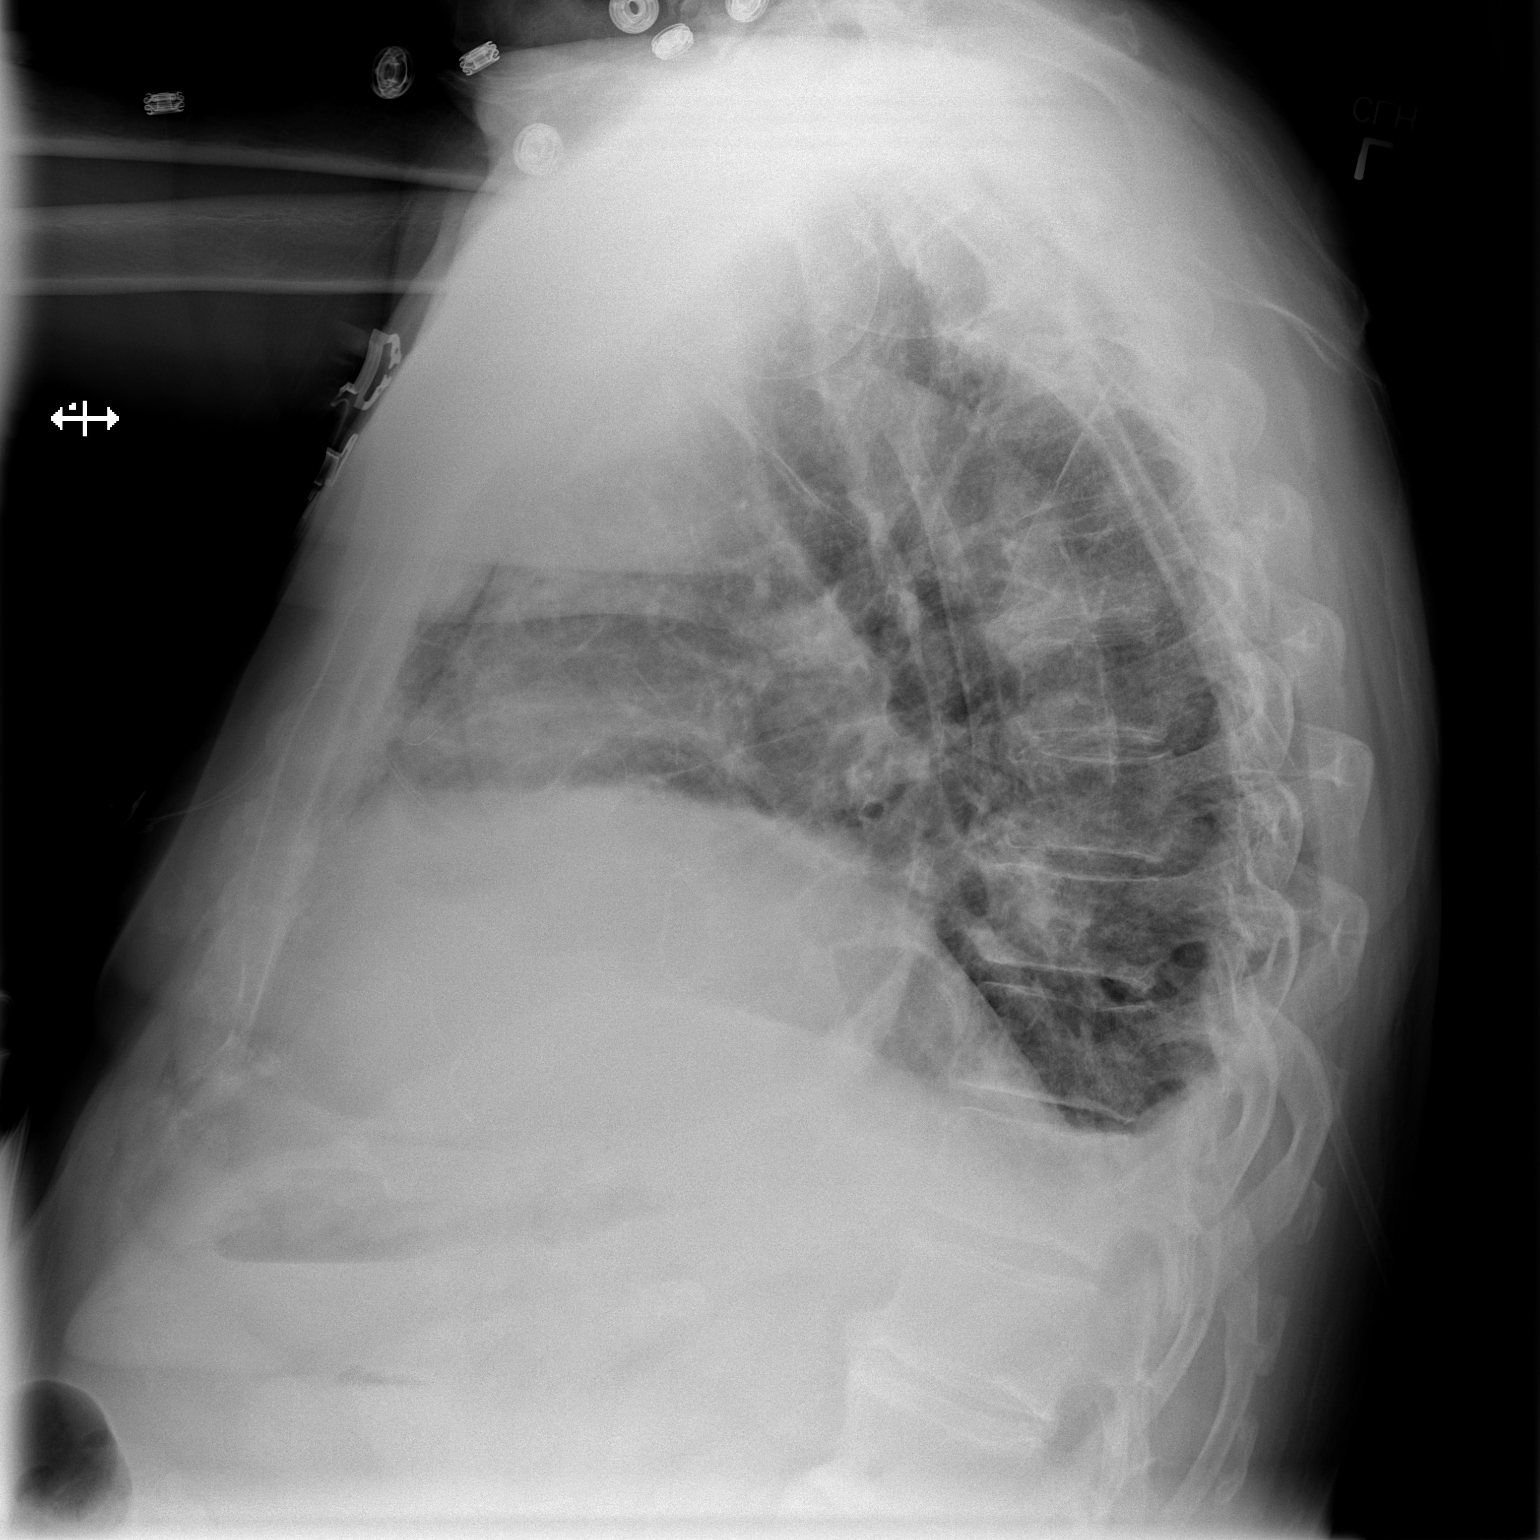

[2 of 2 positions shown; findings below may reference images not displayed]

FINDINGS: Lung volumes are low. There are extensive linear opacities
throughout the lung bases bilaterally and in the right mid lung,
compatible with areas of subsegmental atelectasis or chronic
scarring. No definite acute consolidative airspace disease small
bilateral pleural effusions (unchanged). No evidence of pulmonary
edema. Heart size is normal. Mediastinal contours unremarkable.
Atherosclerosis in the thoracic aorta. Postoperative changes of
mitral annuloplasty noted. Epicardial pacing wires remain in
position.
IMPRESSION: 1. Low lung volumes with bibasilar subsegmental atelectasis and/or
scarring, and small bilateral pleural effusions.
2. Atherosclerosis.
3. Postoperative changes and support apparatus, as above.

## 2016-06-23 ENCOUNTER — Telehealth (HOSPITAL_COMMUNITY): Payer: Self-pay | Admitting: *Deleted

## 2016-06-23 ENCOUNTER — Other Ambulatory Visit (HOSPITAL_COMMUNITY): Payer: Self-pay | Admitting: *Deleted

## 2016-06-23 ENCOUNTER — Encounter (HOSPITAL_COMMUNITY): Payer: Self-pay | Admitting: *Deleted

## 2016-06-23 DIAGNOSIS — I059 Rheumatic mitral valve disease, unspecified: Secondary | ICD-10-CM

## 2016-06-23 NOTE — Telephone Encounter (Signed)
-----   Message from Larey Dresser, MD sent at 06/16/2016 11:25 AM EDT ----- Can you arrange for this patient to have a TEE?   ----- Message ----- From: Margit Hanks, RN Sent: 06/16/2016  10:41 AM To: Larey Dresser, MD  He is being referred back by his cardiologist, Roque Cash, PA-C for a TEE. He has had a recent normal ECHO but DOE. Dr. Roxy Manns has requested that you perform the TEE. Please advise how to proceed. Thank you, Abby Potash

## 2016-06-23 NOTE — Telephone Encounter (Signed)
TEE sch for 10/4, pt aware and instructions reviewed with him via phone

## 2016-06-29 ENCOUNTER — Encounter (HOSPITAL_COMMUNITY): Payer: Self-pay | Admitting: *Deleted

## 2016-06-29 ENCOUNTER — Ambulatory Visit (HOSPITAL_BASED_OUTPATIENT_CLINIC_OR_DEPARTMENT_OTHER)
Admission: RE | Admit: 2016-06-29 | Discharge: 2016-06-29 | Disposition: A | Discharge status: Home / Self Care | Payer: Medicare HMO | Source: Ambulatory Visit | Attending: Cardiology | Admitting: Cardiology

## 2016-06-29 ENCOUNTER — Encounter (HOSPITAL_COMMUNITY)
Admission: RE | Disposition: A | Discharge status: Home / Self Care | Payer: Self-pay | Source: Ambulatory Visit | Attending: Cardiology

## 2016-06-29 ENCOUNTER — Ambulatory Visit (HOSPITAL_COMMUNITY)
Admission: RE | Admit: 2016-06-29 | Discharge: 2016-06-29 | Disposition: A | Discharge status: Home / Self Care | Payer: Medicare HMO | Source: Ambulatory Visit | Attending: Cardiology | Admitting: Cardiology

## 2016-06-29 DIAGNOSIS — Z7982 Long term (current) use of aspirin: Secondary | ICD-10-CM | POA: Diagnosis not present

## 2016-06-29 DIAGNOSIS — I340 Nonrheumatic mitral (valve) insufficiency: Secondary | ICD-10-CM

## 2016-06-29 DIAGNOSIS — N189 Chronic kidney disease, unspecified: Secondary | ICD-10-CM | POA: Diagnosis not present

## 2016-06-29 DIAGNOSIS — E785 Hyperlipidemia, unspecified: Secondary | ICD-10-CM | POA: Insufficient documentation

## 2016-06-29 DIAGNOSIS — I2510 Atherosclerotic heart disease of native coronary artery without angina pectoris: Secondary | ICD-10-CM | POA: Diagnosis not present

## 2016-06-29 DIAGNOSIS — I481 Persistent atrial fibrillation: Secondary | ICD-10-CM | POA: Diagnosis not present

## 2016-06-29 DIAGNOSIS — I129 Hypertensive chronic kidney disease with stage 1 through stage 4 chronic kidney disease, or unspecified chronic kidney disease: Secondary | ICD-10-CM | POA: Diagnosis not present

## 2016-06-29 DIAGNOSIS — I059 Rheumatic mitral valve disease, unspecified: Secondary | ICD-10-CM

## 2016-06-29 DIAGNOSIS — Z8249 Family history of ischemic heart disease and other diseases of the circulatory system: Secondary | ICD-10-CM | POA: Insufficient documentation

## 2016-06-29 DIAGNOSIS — Z79899 Other long term (current) drug therapy: Secondary | ICD-10-CM | POA: Diagnosis not present

## 2016-06-29 DIAGNOSIS — R0602 Shortness of breath: Secondary | ICD-10-CM | POA: Insufficient documentation

## 2016-06-29 HISTORY — PX: TEE WITHOUT CARDIOVERSION: SHX5443

## 2016-06-29 LAB — BASIC METABOLIC PANEL
ANION GAP: 7 (ref 5–15)
BUN: 26 mg/dL — AB (ref 6–20)
CO2: 24 mmol/L (ref 22–32)
Calcium: 9.5 mg/dL (ref 8.9–10.3)
Chloride: 107 mmol/L (ref 101–111)
Creatinine, Ser: 2.08 mg/dL — ABNORMAL HIGH (ref 0.61–1.24)
GFR, EST AFRICAN AMERICAN: 33 mL/min — AB (ref >60)
GFR, EST NON AFRICAN AMERICAN: 29 mL/min — AB (ref >60)
Glucose, Bld: 100 mg/dL — ABNORMAL HIGH (ref 65–99)
POTASSIUM: 4.8 mmol/L (ref 3.5–5.1)
SODIUM: 138 mmol/L (ref 135–145)

## 2016-06-29 LAB — PROTIME-INR
INR: 1.09
PROTHROMBIN TIME: 14.1 s (ref 11.4–15.2)

## 2016-06-29 SURGERY — ECHOCARDIOGRAM, TRANSESOPHAGEAL
Anesthesia: Moderate Sedation

## 2016-06-29 MED ORDER — MIDAZOLAM HCL 5 MG/ML IJ SOLN
INTRAMUSCULAR | Status: AC
  Filled 2016-06-29: qty 2

## 2016-06-29 MED ORDER — HYDRALAZINE HCL 20 MG/ML IJ SOLN
INTRAMUSCULAR | Status: AC
  Filled 2016-06-29: qty 1

## 2016-06-29 MED ORDER — FENTANYL CITRATE (PF) 100 MCG/2ML IJ SOLN
INTRAMUSCULAR | Status: DC | PRN
  Administered 2016-06-29: 50 ug via INTRAVENOUS
  Administered 2016-06-29: 25 ug via INTRAVENOUS

## 2016-06-29 MED ORDER — BUTAMBEN-TETRACAINE-BENZOCAINE 2-2-14 % EX AERO
INHALATION_SPRAY | CUTANEOUS | Status: DC | PRN
  Administered 2016-06-29: 2 via TOPICAL

## 2016-06-29 MED ORDER — APIXABAN 2.5 MG PO TABS: 5.0000 mg | ORAL_TABLET | Freq: Two times a day (BID) | ORAL | Status: DC

## 2016-06-29 MED ORDER — FENTANYL CITRATE (PF) 100 MCG/2ML IJ SOLN
INTRAMUSCULAR | Status: AC
  Filled 2016-06-29: qty 2

## 2016-06-29 MED ORDER — HYDRALAZINE HCL 20 MG/ML IJ SOLN
INTRAMUSCULAR | Status: DC | PRN
  Administered 2016-06-29 (×2): 10 mg via INTRAVENOUS

## 2016-06-29 MED ORDER — MIDAZOLAM HCL 10 MG/2ML IJ SOLN
INTRAMUSCULAR | Status: DC | PRN
  Administered 2016-06-29: 1 mg via INTRAVENOUS
  Administered 2016-06-29 (×2): 2 mg via INTRAVENOUS

## 2016-06-29 MED ORDER — SODIUM CHLORIDE 0.9 % IV SOLN
INTRAVENOUS | Status: DC
  Administered 2016-06-29: 09:00:00 via INTRAVENOUS

## 2016-06-29 NOTE — H&P (Signed)
Physician History and Physical    Daniel Mayo MRN: [id number] DOB/AGE: 1934-10-05 81 y.o. Admit date: 06/29/2016  Primary Cardiologist: Claudie Leach  HPI: Patient with history of mitral valve repair/Maze now with increased dyspnea.  Recent echo was unremarkable.  TEE requested by Dr. Roxy Manns.   Review of systems complete and found to be negative unless listed above   Past Medical History:  Diagnosis Date  . AF (atrial fibrillation) ([place])   . Allergic rhinitis   . Atrial fibrillation ([place]) 02/17/2010   Persistent atrial fibrillation    . Chronic back pain   . Chronic kidney disease (CKD)   . CKD (chronic kidney disease)   . Coronary artery disease   . Elevated PSA   . Erectile dysfunction   . GERD (gastroesophageal reflux disease)   . HOH (hard of hearing)   . Hyperlipidemia   . Hypertension   . Hypogonadism male   . Insomnia   . Left ventricular hypertrophy   . Mitral regurgitation 07/12/2013  . Mitral regurgitation   . Renal disorder   . S/P Maze operation for atrial fibrillation 08/14/2013   Complete bilateral atrial lesion set using cryothermy and bipolar radiofrequency ablation  . S/P minimally invasive mitral valve repair + maze procedure 08/14/2013   28 mm Sorin Memo 3D ring annuloplasty via right mini thoracotomy approach  . SOB (shortness of breath)   . Somnolence   . Stenosis of carotid artery   . Tricuspid regurgitation      Family History  Problem Relation Age of Onset  . Diabetes    . Hypertension    . Coronary artery disease      Social History   Social History  . Marital status: Single    Spouse name: N/A  . Number of children: N/A  . Years of education: N/A   Occupational History  . retired    Social History Main Topics  . Smoking status: Never Smoker  . Smokeless tobacco: Never Used     Comment: stopped smoking in his 20's  . Alcohol use 8.4 oz/week    14 Glasses of wine per week     Comment: 1- 2 glasses of wine a night  . Drug use:  No  . Sexual activity: Not on file   Other Topics Concern  . Not on file   Social History Narrative  . No narrative on file     Prescriptions Prior to Admission  Medication Sig Dispense Refill Last Dose  . aspirin EC 81 MG tablet Take 81 mg by mouth daily.   06/28/2016 at Unknown time  . Cholecalciferol (D3-1000) 1000 UNITS capsule Take 1,000 Units by mouth daily.   06/28/2016 at Unknown time  . finasteride (PROSCAR) 5 MG tablet Take 5 mg by mouth daily. Every other day   06/28/2016 at Unknown time  . flecainide (TAMBOCOR) 50 MG tablet Take 50 mg by mouth daily.   06/28/2016 at Unknown time  . Multiple Vitamins-Minerals (CENTRUM ADULTS PO) Take 1 tablet by mouth daily.   06/28/2016 at Unknown time  . nitroGLYCERIN (NITROSTAT) 0.4 MG SL tablet Place 0.4 mg under the tongue every 5 (five) minutes as needed for chest pain.   06/28/2016 at Unknown time  . omeprazole (PRILOSEC) 20 MG capsule Take 20 mg by mouth daily.    06/28/2016 at Unknown time  . verapamil (CALAN) 120 MG tablet Take 120 mg by mouth once.   06/28/2016 at Unknown time    Physical Exam: Blood  pressure (!) 179/111, pulse (P) 70, temperature 98 F (36.7 C), temperature source Oral, resp. rate (P) 16, height 6' 2.5" (1.892 m), weight 230 lb (104.3 kg), SpO2 (P) 99 %.  General: NAD Neck: No JVD, no thyromegaly or thyroid nodule.  Lungs: Clear to auscultation bilaterally with normal respiratory effort. CV: Nondisplaced PMI.  Heart regular S1/S2, no S3/S4, no murmur.  No peripheral edema.  No carotid bruit.  Normal pedal pulses.  Abdomen: Soft, nontender, no hepatosplenomegaly, no distention.  Skin: Intact without lesions or rashes.  Neurologic: Alert and oriented x 3.  Psych: Normal affect. Extremities: No clubbing or cyanosis.  HEENT: Normal.   Labs:   Lab Results  Component Value Date   WBC 8.3 08/18/2013   HGB 10.5 (L) 08/18/2013   HCT 30.4 (L) 08/18/2013   MCV 96.8 08/18/2013   PLT 83 (L) 08/18/2013    Recent  Labs Lab 06/29/16 0825  NA 138  K 4.8  CL 107  CO2 24  BUN 26*  CREATININE 2.08*  CALCIUM 9.5  GLUCOSE 100*    ASSESSMENT AND PLAN: 81 yo with history of MV repair and atrial fibrillation has developed increased dyspnea.  Scheduled for TEE today by Dr. Roxy Manns.  Patient understands risks/benefits of procedure and agrees to it today.   Signed: [name] 06/29/2016, 10:08 AM

## 2016-06-29 NOTE — Discharge Instructions (Signed)
Moderate Conscious Sedation, Adult °Sedation is the use of medicines to promote relaxation and relieve discomfort and anxiety. Moderate conscious sedation is a type of sedation. Under moderate conscious sedation you are less alert than normal but are still able to respond to instructions or stimulation. Moderate conscious sedation is used during short medical and dental procedures. It is milder than deep sedation or general anesthesia and allows you to return to your regular activities sooner. °LET YOUR HEALTH CARE PROVIDER KNOW ABOUT:  °· Any allergies you have. °· All medicines you are taking, including vitamins, herbs, eye drops, creams, and over-the-counter medicines. °· Use of steroids (by mouth or creams). °· Previous problems you or members of your family have had with the use of anesthetics. °· Any blood disorders you have. °· Previous surgeries you have had. °· Medical conditions you have. °· Possibility of pregnancy, if this applies. °· Use of cigarettes, alcohol, or illegal drugs. °RISKS AND COMPLICATIONS °Generally, this is a safe procedure. However, as with any procedure, problems can occur. Possible problems include: °· Oversedation. °· Trouble breathing on your own. You may need to have a breathing tube until you are awake and breathing on your own. °· Allergic reaction to any of the medicines used for the procedure. °BEFORE THE PROCEDURE °· You may have blood tests done. These tests can help show how well your kidneys and liver are working. They can also show how well your blood clots. °· A physical exam will be done.   °· Only take medicines as directed by your health care provider. You may need to stop taking medicines (such as blood thinners, aspirin, or nonsteroidal anti-inflammatory drugs) before the procedure.   °· Do not eat or drink at least 6 hours before the procedure or as directed by your health care provider. °· Arrange for a responsible adult, family member, or friend to take you home  after the procedure. He or she should stay with you for at least 24 hours after the procedure, until the medicine has worn off. °PROCEDURE  °· An intravenous (IV) catheter will be inserted into one of your veins. Medicine will be able to flow directly into your body through this catheter. You may be given medicine through this tube to help prevent pain and help you relax. °· The medical or dental procedure will be done. °AFTER THE PROCEDURE °· You will stay in a recovery area until the medicine has worn off. Your blood pressure and pulse will be checked.   °·  Depending on the procedure you had, you may be allowed to go home when you can tolerate liquids and your pain is under control. °  °This information is not intended to replace advice given to you by your health care provider. Make sure you discuss any questions you have with your health care provider. °  °Document Released: 06/07/2001 Document Revised: 10/03/2014 Document Reviewed: 05/20/2013 °Elsevier Interactive Patient Education ©2016 [employer] ° °

## 2016-06-29 NOTE — Progress Notes (Signed)
  Echocardiogram Echocardiogram Transesophageal has been performed.  Tresa Res 06/29/2016, 11:05 AM
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
## 2016-06-30 ENCOUNTER — Encounter (HOSPITAL_COMMUNITY): Payer: Self-pay | Admitting: Cardiology

## 2016-07-04 ENCOUNTER — Ambulatory Visit (INDEPENDENT_AMBULATORY_CARE_PROVIDER_SITE_OTHER): Payer: Medicare HMO | Admitting: Thoracic Surgery (Cardiothoracic Vascular Surgery)

## 2016-07-04 ENCOUNTER — Encounter: Payer: Self-pay | Admitting: Thoracic Surgery (Cardiothoracic Vascular Surgery)

## 2016-07-04 VITALS — BP 149/78 | HR 78 | Resp 16 | Ht 74.0 in | Wt 225.0 lb

## 2016-07-04 DIAGNOSIS — I340 Nonrheumatic mitral (valve) insufficiency: Secondary | ICD-10-CM | POA: Diagnosis not present

## 2016-07-04 DIAGNOSIS — Z9889 Other specified postprocedural states: Secondary | ICD-10-CM

## 2016-07-04 DIAGNOSIS — Z8679 Personal history of other diseases of the circulatory system: Secondary | ICD-10-CM | POA: Diagnosis not present

## 2016-07-04 DIAGNOSIS — R0602 Shortness of breath: Secondary | ICD-10-CM | POA: Diagnosis not present
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
## 2016-07-04 NOTE — Patient Instructions (Signed)
Continue all previous medications without any changes at this time  Discuss the risks/benefits of switching from Eliquis to warfarin with Dr. Claudie Leach and Dr. Aundra Dubin  Discuss whether or not to check a Holter monitor or consider implantation of a loop recorder to monitor your heart rhythm  Endocarditis is a potentially serious infection of heart valves or inside lining of the heart.  It occurs more commonly in patients with diseased heart valves (such as patient's with aortic or mitral valve disease) and in patients who have undergone heart valve repair or replacement.  Certain surgical and dental procedures may put you at risk, such as dental cleaning, other dental procedures, or any surgery involving the respiratory, urinary, gastrointestinal tract, gallbladder or prostate gland.   To minimize your chances for develooping endocarditis, maintain good oral health and seek prompt medical attention for any infections involving the mouth, teeth, gums, skin or urinary tract.    Always notify your doctor or dentist about your underlying heart valve condition before having any invasive procedures. You will need to take antibiotics before certain procedures, including all routine dental cleanings or other dental procedures.  Your cardiologist or dentist should prescribe these antibiotics for you to be taken ahead of time.

## 2016-07-06 ENCOUNTER — Ambulatory Visit (HOSPITAL_COMMUNITY)
Admission: RE | Admit: 2016-07-06 | Discharge: 2016-07-06 | Disposition: A | Discharge status: Home / Self Care | Payer: Medicare HMO | Source: Ambulatory Visit | Attending: Cardiology | Admitting: Cardiology

## 2016-07-06 ENCOUNTER — Encounter (HOSPITAL_COMMUNITY): Payer: Self-pay

## 2016-07-06 VITALS — BP 150/84 | HR 78 | Wt 232.0 lb

## 2016-07-06 DIAGNOSIS — N183 Chronic kidney disease, stage 3 unspecified: Secondary | ICD-10-CM

## 2016-07-06 DIAGNOSIS — I340 Nonrheumatic mitral (valve) insufficiency: Secondary | ICD-10-CM | POA: Insufficient documentation

## 2016-07-06 DIAGNOSIS — Z7902 Long term (current) use of antithrombotics/antiplatelets: Secondary | ICD-10-CM | POA: Diagnosis not present

## 2016-07-06 DIAGNOSIS — Z833 Family history of diabetes mellitus: Secondary | ICD-10-CM | POA: Diagnosis not present

## 2016-07-06 DIAGNOSIS — R0609 Other forms of dyspnea: Secondary | ICD-10-CM

## 2016-07-06 DIAGNOSIS — I342 Nonrheumatic mitral (valve) stenosis: Secondary | ICD-10-CM

## 2016-07-06 DIAGNOSIS — Z8249 Family history of ischemic heart disease and other diseases of the circulatory system: Secondary | ICD-10-CM | POA: Insufficient documentation

## 2016-07-06 DIAGNOSIS — I129 Hypertensive chronic kidney disease with stage 1 through stage 4 chronic kidney disease, or unspecified chronic kidney disease: Secondary | ICD-10-CM | POA: Diagnosis not present

## 2016-07-06 DIAGNOSIS — K219 Gastro-esophageal reflux disease without esophagitis: Secondary | ICD-10-CM | POA: Insufficient documentation

## 2016-07-06 DIAGNOSIS — I2510 Atherosclerotic heart disease of native coronary artery without angina pectoris: Secondary | ICD-10-CM | POA: Insufficient documentation

## 2016-07-06 DIAGNOSIS — I480 Paroxysmal atrial fibrillation: Secondary | ICD-10-CM | POA: Diagnosis not present

## 2016-07-06 DIAGNOSIS — Z8673 Personal history of transient ischemic attack (TIA), and cerebral infarction without residual deficits: Secondary | ICD-10-CM | POA: Insufficient documentation

## 2016-07-06 DIAGNOSIS — Z9889 Other specified postprocedural states: Secondary | ICD-10-CM | POA: Insufficient documentation

## 2016-07-06 DIAGNOSIS — I5022 Chronic systolic (congestive) heart failure: Secondary | ICD-10-CM | POA: Diagnosis not present

## 2016-07-06 DIAGNOSIS — I429 Cardiomyopathy, unspecified: Secondary | ICD-10-CM | POA: Insufficient documentation

## 2016-07-06 LAB — CBC
HCT: 45.1 % (ref 39.0–52.0)
HEMOGLOBIN: 15.6 g/dL (ref 13.0–17.0)
MCH: 34 pg (ref 26.0–34.0)
MCHC: 34.6 g/dL (ref 30.0–36.0)
MCV: 98.3 fL (ref 78.0–100.0)
Platelets: 109 10*3/uL — ABNORMAL LOW (ref 150–400)
RBC: 4.59 MIL/uL (ref 4.22–5.81)
RDW: 13.8 % (ref 11.5–15.5)
WBC: 7.5 10*3/uL (ref 4.0–10.5)

## 2016-07-06 LAB — BRAIN NATRIURETIC PEPTIDE: B Natriuretic Peptide: 42.6 pg/mL (ref 0.0–100.0)

## 2016-07-06 MED ORDER — CARVEDILOL 6.25 MG PO TABS: 6.2500 mg | ORAL_TABLET | Freq: Two times a day (BID) | ORAL | 3 refills | Status: DC

## 2016-07-06 MED ORDER — LOSARTAN POTASSIUM 50 MG PO TABS: 50.0000 mg | ORAL_TABLET | Freq: Every day | ORAL | Status: DC

## 2016-07-06 NOTE — Patient Instructions (Signed)
Please take Losartan 50 mg daily  Stop Verapamil   Start Carvedilol 6.25 mg Twice daily   DR. McLEAN WOULD LIKE YOU TO STOP ELIQUIS WHEN YOU RUN OUT AND START ON COUMADIN, PLEASE DISCUSS WITH MIKE DURAN AND SEE IF HIS OFFICE WILL MONITOR COUMADIN FOR YOU, IF NOT PLEASE CALL us BACK TO ARRANGE.  Labs today  Your physician has recommended that you wear an event monitor. Event monitors are medical devices that record the heart's electrical activity. Doctors most often [place] these monitors to diagnose arrhythmias. Arrhythmias are problems with the speed or rhythm of the heartbeat. The monitor is a small, portable device. You can wear one while you do your normal daily activities. This is usually used to diagnose what is causing palpitations/syncope (passing out).  You have been referred to Dr Justin Mend at [facility], they will call you for the appointment  Your physician recommends that you schedule a follow-up appointment in: 6 weeks

## 2016-07-06 NOTE — Progress Notes (Signed)
PCP: Lennice Sites [facility] Cardiology: Dr. Aundra Dubin  81 yo with history of paroxysmal atrial fibrillation and severe mitral regurgitation s/p minimally invasive MV repair and Maze in 11/14 presents for cardiology evaluation.  He has been followed at [facility] and I do not have recent records available.  He developed increased exertional dyspnea over the last 6 months.  He was short of breath walking a short distance or doing housework like vacuuming.  He also had a TIA apparently several months ago and was started on apixaban 2.5 mg bid (he had not been anticoagulated since around the time of his prior surgery). He was sent back to Dr Roxy Manns for re-evaluation, there was concern for stenosis of the repaired mitral valve.  He had a TEE in 10/17 showing EF 45-50% range with moderate to moderate-severe tricuspid regurgitation, mildly decreased RV systolic function, and possibly moderate stenosis of the repaired mitral valve (mild to moderate regurgitation).  Most significantly, there was a layered thrombus along the left atrial wall.  I increased his dose of apixaban to 5 mg bid at that time.   He returns for followup.  He was seen earlier this week by Dr Roxy Manns, no plan for redo surgery at this time. Interestingly, he feels significantly improved though we have not really done much that would make him symptomatically better. He now can walk at least 1/2 mile before tiring.  No problems walking around the house or in stores.  No chest pain.  No orthopnea/PND.  No tachypalpitations.  He is in NSR today.    Labs (10/17): K 4.8, creatinine 2.08  ECG: NSR, iRBBB  PMH: 1. Mitral regurgitation: s/p minimally invasive mitral valve repair in 11/14 (Dr Roxy Manns).  He has developed stenosis of the repaired valve.  - TEE (10/17) with EF 45-50%, mild diffuse hypokinesis, mildly dilated RV with mildly decreased systolic function, moderate to moderate-severe tricuspid regurgitation, peak RV-RA gradient 31 mmHg, mild AI, mitral  valve s/p repair with mild-moderate MR, posterior leaflet relatively fixed with mean gradient 6 mmHg and MVA 1.22 cm^2 => possible moderate mitral stenosis; there was layered thrombus along the wall of the left atrium.  2. TIA 3. Atrial fibrillation: Paroxysmal.  S/p Maze procedure with MV repair in 11/14.  4. CKD 5. GERD 6. HTN 7. CAD: LHC (2014) with 70% proximal stenosis in a small high diagonal, otherwise no significant disease.   SH: Lives in [place], not married but has girlfriend, 1-2 glasses wine/night, nonsmoker.   Family History  Problem Relation Age of Onset  . Diabetes    . Hypertension    . Coronary artery disease     ROS: All systems reviewed and negative except as per HPI.  Current Outpatient Prescriptions  Medication Sig Dispense Refill  . apixaban (ELIQUIS) 2.5 MG TABS tablet Take 2 tablets (5 mg total) by mouth 2 (two) times daily. 60 tablet   . losartan (COZAAR) 50 MG tablet Take 1 tablet (50 mg total) by mouth daily.    . carvedilol (COREG) 6.25 MG tablet Take 1 tablet (6.25 mg total) by mouth 2 (two) times daily. 60 tablet 3  . nitroGLYCERIN (NITROSTAT) 0.4 MG SL tablet Place 0.4 mg under the tongue every 5 (five) minutes as needed for chest pain.     No current facility-administered medications for this encounter.    BP (!) 150/84   Pulse 78   Wt 232 lb (105.2 kg)   SpO2 99%   BMI 29.79 kg/m  General: NAD Neck:  No JVD, no thyromegaly or thyroid nodule.  Lungs: Clear to auscultation bilaterally with normal respiratory effort. CV: Nondisplaced PMI.  Heart regular S1/S2, no S3/S4, 1/6 HSM LLSB.  No peripheral edema.  No carotid bruit.  Normal pedal pulses.  Abdomen: Soft, nontender, no hepatosplenomegaly, no distention.  Skin: Intact without lesions or rashes.  Neurologic: Alert and oriented x 3.  Psych: Normal affect. Extremities: No clubbing or cyanosis.  HEENT: Normal.   Assessment/Plan: 1. Exertional dyspnea: Patient had TEE 10/17 with EF 45-50%  and probably moderate stenosis of the repaired mitral valve.  On exam today, he is not volume overloaded.  Symptoms now NYHA class II.  Considerably improved over the last couple of weeks though there has really been no intervention.  It is possible that he is in atrial fibrillation from time to time, with worsening of symptoms while in atrial fibrillation.  However, he is in NSR today.  - Discussed mitral stenosis with Dr Roxy Manns.  Stenosis is not severe, probably in the moderate range post-MV repair with mean gradient only 6 mmHg.  Would hold off on surgical re-intervention for now though he may need in the future.   - I do not think he needs a diuretic at this time.  - I think we need to investigate how often he is in atrial fibrillation => will place 30 day monitor (see below).  - Initially I had planned to do RHC, but given the markedly improved symptoms and his preference to avoid any further procedures at this time, will hold off unless symptoms worsen.  2. Atrial fibrillation: Paroxysmal.  Had Maze in 11/14.  He was off anticoagulation until a TIA earlier this year and was started on apixaban 2.5 mg bid.  Recent TEE while on the low dose apixaban showed a layered mural thrombus.  As he was under-dosed on apixaban, it was increased to 5 mg bid.  - Given valvular atrial fibrillation (now with mitral stenosis), he should ideally be on warfarin.  He has about 6 weeks' worth apixaban left.  I will have him finish this, then will convert him over to warfarin.  He will need INR followup with coumadin clinic.  - Given mildly decreased EF, would rather have him on Coreg than verapamil for rate control when/if he goes into atrial fibrillation.  Will stop verapamil, start Coreg 6.25 mg bid.  - CBC today.  - As above, 30 day monitor to assess atrial fibrillation burden (?if he would benefit from antiarrhythmic).  3. Cardiomyopathy: Mild, EF 45-50% on recent TEE.  Suspect nonischemic cardiomyopathy.  - As above,  stop verapamil and start Coreg 6.25 mg bid.   - He is on losartan 50 mg daily, will continue (need to follow creatinine closely).  - BNP today.  4. Mitral valve disorder: History of mitral regurgitation, now s/p minimally invasive MV repair.  Now with moderate stenosis of the repaired valve, mean gradient 6 mmHg.  As above, for now will observe without redo operation.  Repeat echo in a year.  5. CKD: Stage III.  Creatinine around 2.  I think it would be reasonable to have him establish with a nephrologist.  He requests Dr Justin Mend.  I will arrange.   Followup in 6 weeks.   [name] 07/06/2016

## 2016-07-06 NOTE — Addendum Note (Signed)
Encounter addended by: Larey Dresser, MD on: 07/06/2016  5:39 PM<BR>    Actions taken: Sign clinical note, LOS modified, Diagnosis association updated, Visit diagnoses modified

## 2016-07-11 ENCOUNTER — Telehealth (HOSPITAL_COMMUNITY): Payer: Self-pay | Admitting: *Deleted

## 2016-07-11 NOTE — Telephone Encounter (Signed)
Referral was faxed to [facility] 07/08/16, received message from them today pt was rated a level 2 and will be scheduled for an appt within a month

## 2016-07-19 ENCOUNTER — Ambulatory Visit (INDEPENDENT_AMBULATORY_CARE_PROVIDER_SITE_OTHER): Payer: Medicare HMO

## 2016-07-19 DIAGNOSIS — I480 Paroxysmal atrial fibrillation: Secondary | ICD-10-CM

## 2016-07-28 ENCOUNTER — Other Ambulatory Visit: Payer: Self-pay | Admitting: Nephrology

## 2016-07-28 DIAGNOSIS — N2581 Secondary hyperparathyroidism of renal origin: Secondary | ICD-10-CM

## 2016-07-28 DIAGNOSIS — N183 Chronic kidney disease, stage 3 unspecified: Secondary | ICD-10-CM

## 2016-08-01 ENCOUNTER — Ambulatory Visit
Admission: RE | Admit: 2016-08-01 | Discharge: 2016-08-01 | Disposition: A | Discharge status: Home / Self Care | Payer: Medicare HMO | Source: Ambulatory Visit | Attending: Nephrology | Admitting: Nephrology

## 2016-08-01 DIAGNOSIS — N183 Chronic kidney disease, stage 3 unspecified: Secondary | ICD-10-CM

## 2016-08-01 DIAGNOSIS — N2581 Secondary hyperparathyroidism of renal origin: Secondary | ICD-10-CM

## 2016-08-22 ENCOUNTER — Encounter (HOSPITAL_COMMUNITY): Payer: Self-pay

## 2016-08-22 ENCOUNTER — Ambulatory Visit (HOSPITAL_COMMUNITY)
Admission: RE | Admit: 2016-08-22 | Discharge: 2016-08-22 | Disposition: A | Discharge status: Home / Self Care | Payer: Medicare HMO | Source: Ambulatory Visit | Attending: Cardiology | Admitting: Cardiology

## 2016-08-22 VITALS — BP 110/66 | HR 64 | Wt 235.1 lb

## 2016-08-22 DIAGNOSIS — Z79899 Other long term (current) drug therapy: Secondary | ICD-10-CM | POA: Diagnosis not present

## 2016-08-22 DIAGNOSIS — I429 Cardiomyopathy, unspecified: Secondary | ICD-10-CM | POA: Diagnosis not present

## 2016-08-22 DIAGNOSIS — I4891 Unspecified atrial fibrillation: Secondary | ICD-10-CM

## 2016-08-22 DIAGNOSIS — I129 Hypertensive chronic kidney disease with stage 1 through stage 4 chronic kidney disease, or unspecified chronic kidney disease: Secondary | ICD-10-CM | POA: Insufficient documentation

## 2016-08-22 DIAGNOSIS — I480 Paroxysmal atrial fibrillation: Secondary | ICD-10-CM | POA: Diagnosis present

## 2016-08-22 DIAGNOSIS — I340 Nonrheumatic mitral (valve) insufficiency: Secondary | ICD-10-CM | POA: Diagnosis not present

## 2016-08-22 DIAGNOSIS — R0609 Other forms of dyspnea: Secondary | ICD-10-CM | POA: Insufficient documentation

## 2016-08-22 DIAGNOSIS — I5022 Chronic systolic (congestive) heart failure: Secondary | ICD-10-CM | POA: Diagnosis not present

## 2016-08-22 DIAGNOSIS — N183 Chronic kidney disease, stage 3 (moderate): Secondary | ICD-10-CM | POA: Insufficient documentation

## 2016-08-22 LAB — BASIC METABOLIC PANEL
Anion gap: 8 (ref 5–15)
BUN: 31 mg/dL — ABNORMAL HIGH (ref 6–20)
CALCIUM: 9.8 mg/dL (ref 8.9–10.3)
CHLORIDE: 107 mmol/L (ref 101–111)
CO2: 25 mmol/L (ref 22–32)
CREATININE: 2.4 mg/dL — AB (ref 0.61–1.24)
GFR calc Af Amer: 28 mL/min — ABNORMAL LOW (ref >60)
GFR calc non Af Amer: 24 mL/min — ABNORMAL LOW (ref >60)
GLUCOSE: 103 mg/dL — AB (ref 65–99)
Potassium: 5.1 mmol/L (ref 3.5–5.1)
Sodium: 140 mmol/L (ref 135–145)

## 2016-08-22 LAB — CBC
HCT: 46.3 % (ref 39.0–52.0)
Hemoglobin: 15.9 g/dL (ref 13.0–17.0)
MCH: 33.8 pg (ref 26.0–34.0)
MCHC: 34.3 g/dL (ref 30.0–36.0)
MCV: 98.3 fL (ref 78.0–100.0)
PLATELETS: 105 10*3/uL — AB (ref 150–400)
RBC: 4.71 MIL/uL (ref 4.22–5.81)
RDW: 13.2 % (ref 11.5–15.5)
WBC: 7.7 10*3/uL (ref 4.0–10.5)

## 2016-08-22 MED ORDER — APIXABAN 5 MG PO TABS: 5.0000 mg | ORAL_TABLET | Freq: Two times a day (BID) | ORAL | 3 refills | Status: DC

## 2016-08-22 NOTE — Progress Notes (Signed)
PCP: Lennice Sites HF Cardiology: Dr. Aundra Dubin  81 yo with history of paroxysmal atrial fibrillation and severe mitral regurgitation s/p minimally invasive MV repair and Maze in 11/14 presents for cardiology evaluation.  He has been followed at [facility] and I do not have recent records available.  He developed increased exertional dyspnea over the last 6 months.  He was short of breath walking a short distance or doing housework like vacuuming.  He also had a TIA apparently several months ago and was started on apixaban 2.5 mg bid (he had not been anticoagulated since around the time of his prior surgery). He was sent back to Dr Roxy Manns for re-evaluation, there was concern for stenosis of the repaired mitral valve.  He had a TEE in 10/17 showing EF 45-50% range with moderate to moderate-severe tricuspid regurgitation, mildly decreased RV systolic function, and possibly moderate stenosis of the repaired mitral valve (mild to moderate regurgitation).  Most significantly, there was a layered thrombus along the left atrial wall.  I increased his dose of apixaban to 5 mg bid at that time. He was seen in followup by Dr. Roxy Manns, no plan for redo surgery at this time.   Initially after leaving the hospital, he felt much better.  However, after stopping verapamil and starting Coreg, he says he has felt considerably worse.  He has developed exertional dyspnea, with shortness of breath walking 100 yards or pulling his trash can.  No weight gain.  No chest pain.  No orthopnea/PND.  No palpitations.  He strongly feels like the dyspnea is related to the Coreg.  He just turned in his event monitor last week, the results are not yet ready.   Labs (10/17): K 4.8, creatinine 2.08, BNP 43, hgb 15.6  PMH: 1. Mitral regurgitation: s/p minimally invasive mitral valve repair in 11/14 (Dr Roxy Manns).  He has developed stenosis of the repaired valve.  - TEE (10/17) with EF 45-50%, mild diffuse hypokinesis, mildly dilated RV with mildly  decreased systolic function, moderate to moderate-severe tricuspid regurgitation, peak RV-RA gradient 31 mmHg, mild AI, mitral valve s/p repair with mild-moderate MR, posterior leaflet relatively fixed with mean gradient 6 mmHg and MVA 1.22 cm^2 => possible moderate mitral stenosis; there was layered thrombus along the wall of the left atrium.  2. TIA: MRI head 10/17 did not show CVA.  3. Atrial fibrillation: Paroxysmal.  S/p Maze procedure with MV repair in 11/14.  4. CKD 5. GERD 6. HTN 7. CAD: LHC (2014) with 70% proximal stenosis in a small high diagonal, otherwise no significant disease.  - Cardiolite (6/17): No ischemia or infarction.   SH: Lives in [place], not married but has girlfriend, 1-2 glasses wine/night, nonsmoker.   Family History  Problem Relation Age of Onset  . Diabetes    . Hypertension    . Coronary artery disease     ROS: All systems reviewed and negative except as per HPI.  Current Outpatient Prescriptions  Medication Sig Dispense Refill  . apixaban (ELIQUIS) 5 MG TABS tablet Take 1 tablet (5 mg total) by mouth 2 (two) times daily. 60 tablet 3  . losartan (COZAAR) 50 MG tablet Take 1 tablet (50 mg total) by mouth daily.    . nitroGLYCERIN (NITROSTAT) 0.4 MG SL tablet Place 0.4 mg under the tongue every 5 (five) minutes as needed for chest pain.     No current facility-administered medications for this encounter.    BP 110/66   Pulse 64   Wt 235  lb 1.9 oz (106.6 kg)   SpO2 100%   BMI 30.19 kg/m  General: NAD Neck: No JVD, no thyromegaly or thyroid nodule.  Lungs: Clear to auscultation bilaterally with normal respiratory effort. CV: Nondisplaced PMI.  Heart regular S1/S2, no S3/S4, 1/6 HSM LLSB.  No peripheral edema.  No carotid bruit.  Normal pedal pulses.  Abdomen: Soft, nontender, no hepatosplenomegaly, no distention.  Skin: Intact without lesions or rashes.  Neurologic: Alert and oriented x 3.  Psych: Normal affect. Extremities: No clubbing or  cyanosis.  HEENT: Normal.   Assessment/Plan: 1. Exertional dyspnea: Patient had TEE 10/17 with EF 45-50% and probably moderate stenosis of the repaired mitral valve.  On exam today, he is not volume overloaded.  He had significant improvement after last hospitalization but now has developed NYHA class III exertional dyspnea since starting Coreg. By exam, he is in NSR.  - Await event monitor results to see if frequent PAF is contributing to symptoms.  - He does not look like he needs a diuretic by exam.   - Discussed mitral stenosis in the past with Dr Roxy Manns.  Stenosis is not severe, probably in the moderate range post-MV repair with mean gradient only 6 mmHg.  Would hold off on surgical re-intervention for now though he may need in the future.   - Stop Coreg, this may be the culprit for his symptoms.  If he does not improve, consider RHC. 2. Atrial fibrillation: Paroxysmal.  Had Maze in 11/14.  He was off anticoagulation until a TIA earlier this year and was started on apixaban 2.5 mg bid.  TEE while on the low dose apixaban showed a layered mural thrombus.  As he was under-dosed on apixaban, it was increased to 5 mg bid.  - Given valvular atrial fibrillation (now with mitral stenosis), he should ideally be on warfarin.  He is adamant that he does not want to take warfarin, so will continue Eliquis 5 mg bid.   - Given mildly decreased EF, I stopped verapamil and put him on Coreg for rate control in case he goes into atrial fibrillation.  As his exercise intolerance seems to correlate with starting Coreg, I will stop it for now.  He will call back in 1-2 weeks if he does not feel any better.  - CBC today.  - 30 day monitor to assess atrial fibrillation burden was just turned in, await results (?if he would benefit from antiarrhythmic). He appears to be in NSR by exam today.  3. Cardiomyopathy: Mild, EF 45-50% on recent TEE.  Suspect nonischemic cardiomyopathy.  - Stopping Coreg as this appears to have  caused exercise intolerance.   - He is on losartan 50 mg daily, will continue (need to follow creatinine closely).  BMET today.   4. Mitral valve disorder: History of mitral regurgitation, now s/p minimally invasive MV repair.  He has moderate stenosis of the repaired valve, mean gradient 6 mmHg.  As above, for now will observe without redo operation.  Repeat echo in a year.  5. CKD: Stage III.  Creatinine around 2.  He has seen Dr. Justin Mend.  Repeat BMET today.   Followup in 2 months.   [name] 08/22/2016

## 2016-08-22 NOTE — Patient Instructions (Signed)
Stop taking Coreg- call in 2 weeks if you continue to have shortness of breath OR if you have an increase in heart rate/palpiations.  Labs today (will call for abnormal results, otherwise no news is good news)  Follow up in 2 months

## 2016-08-25 ENCOUNTER — Telehealth (HOSPITAL_COMMUNITY): Payer: Self-pay | Admitting: *Deleted

## 2016-08-25 DIAGNOSIS — I509 Heart failure, unspecified: Secondary | ICD-10-CM

## 2016-08-25 NOTE — Telephone Encounter (Signed)
Notes Recorded by Harvie Junior, [place] on 08/25/2016 at 3:45 PM EST Patient aware and added to lab schedule 12/4 ------  Notes Recorded by Larey Dresser, MD on 08/22/2016 at 11:01 PM EST Fairly stable except K may be hemolyzed. Would like repeat BMET ,not urgent.    Ref Range & Units 3d ago 73mo ago 43yr ago   WBC 4.0 - 10.5 K/uL 7.7  7.5  8.3    RBC 4.22 - 5.81 MIL/uL 4.71  4.59  3.14     Hemoglobin 13.0 - 17.0 g/dL 15.9  15.6  10.5     HCT 39.0 - 52.0 % 46.3  45.1  30.4     MCV 78.0 - 100.0 fL 98.3  98.3  96.8    MCH 26.0 - 34.0 pg 33.8  34.0  33.4    MCHC 30.0 - 36.0 g/dL 34.3  34.6  34.5    RDW 11.5 - 15.5 % 13.2  13.8  13.7    Platelets 150 - 400 K/uL 105   109CM   83CM

## 2016-08-25 NOTE — Telephone Encounter (Signed)
-----   Message from Larey Dresser, MD sent at 08/22/2016 11:01 PM EST ----- Fairly stable except K may be hemolyzed.  Would like repeat BMET ,not urgent.

## 2016-08-29 ENCOUNTER — Ambulatory Visit (HOSPITAL_COMMUNITY)
Admission: RE | Admit: 2016-08-29 | Discharge: 2016-08-29 | Disposition: A | Discharge status: Home / Self Care | Payer: Medicare HMO | Source: Ambulatory Visit | Attending: Cardiology | Admitting: Cardiology

## 2016-08-29 DIAGNOSIS — I509 Heart failure, unspecified: Secondary | ICD-10-CM | POA: Diagnosis present

## 2016-08-29 LAB — BASIC METABOLIC PANEL
Anion gap: 8 (ref 5–15)
BUN: 28 mg/dL — AB (ref 6–20)
CHLORIDE: 106 mmol/L (ref 101–111)
CO2: 26 mmol/L (ref 22–32)
CREATININE: 2.36 mg/dL — AB (ref 0.61–1.24)
Calcium: 9.4 mg/dL (ref 8.9–10.3)
GFR calc Af Amer: 29 mL/min — ABNORMAL LOW (ref >60)
GFR calc non Af Amer: 25 mL/min — ABNORMAL LOW (ref >60)
Glucose, Bld: 109 mg/dL — ABNORMAL HIGH (ref 65–99)
Potassium: 4.5 mmol/L (ref 3.5–5.1)
SODIUM: 140 mmol/L (ref 135–145)

## 2016-08-31 ENCOUNTER — Telehealth (HOSPITAL_COMMUNITY): Payer: Self-pay | Admitting: *Deleted

## 2016-08-31 NOTE — Telephone Encounter (Signed)
Great, stay off.

## 2016-08-31 NOTE — Telephone Encounter (Signed)
Called pt w/monitor results, he is aware.  Pt states that he has been off his Carvedilol for 6 or 7 days and feels much much better, he states his SOB is gone.  Will let Dr Aundra Dubin know

## 2016-09-29 ENCOUNTER — Encounter (HOSPITAL_COMMUNITY): Payer: Self-pay | Admitting: Internal Medicine

## 2016-09-29 ENCOUNTER — Ambulatory Visit (HOSPITAL_COMMUNITY)
Admission: RE | Admit: 2016-09-29 | Discharge: 2016-09-29 | Disposition: A | Discharge status: Home / Self Care | Payer: Medicare HMO | Source: Ambulatory Visit | Attending: Internal Medicine | Admitting: Internal Medicine

## 2016-09-29 VITALS — BP 144/80 | HR 69 | Wt 240.8 lb

## 2016-09-29 DIAGNOSIS — R9431 Abnormal electrocardiogram [ECG] [EKG]: Secondary | ICD-10-CM | POA: Diagnosis not present

## 2016-09-29 DIAGNOSIS — R0602 Shortness of breath: Secondary | ICD-10-CM | POA: Diagnosis not present

## 2016-09-29 DIAGNOSIS — I4891 Unspecified atrial fibrillation: Secondary | ICD-10-CM

## 2016-09-29 DIAGNOSIS — G459 Transient cerebral ischemic attack, unspecified: Secondary | ICD-10-CM

## 2016-09-29 DIAGNOSIS — R0609 Other forms of dyspnea: Secondary | ICD-10-CM

## 2016-09-29 DIAGNOSIS — N183 Chronic kidney disease, stage 3 unspecified: Secondary | ICD-10-CM

## 2016-09-29 DIAGNOSIS — I5022 Chronic systolic (congestive) heart failure: Secondary | ICD-10-CM | POA: Diagnosis not present

## 2016-09-29 DIAGNOSIS — I342 Nonrheumatic mitral (valve) stenosis: Secondary | ICD-10-CM

## 2016-09-29 LAB — BASIC METABOLIC PANEL
ANION GAP: 7 (ref 5–15)
BUN: 35 mg/dL — AB (ref 6–20)
CHLORIDE: 108 mmol/L (ref 101–111)
CO2: 25 mmol/L (ref 22–32)
Calcium: 9.7 mg/dL (ref 8.9–10.3)
Creatinine, Ser: 2.29 mg/dL — ABNORMAL HIGH (ref 0.61–1.24)
GFR calc Af Amer: 30 mL/min — ABNORMAL LOW (ref >60)
GFR calc non Af Amer: 25 mL/min — ABNORMAL LOW (ref >60)
Glucose, Bld: 104 mg/dL — ABNORMAL HIGH (ref 65–99)
POTASSIUM: 4.6 mmol/L (ref 3.5–5.1)
SODIUM: 140 mmol/L (ref 135–145)

## 2016-09-29 LAB — BRAIN NATRIURETIC PEPTIDE: B Natriuretic Peptide: 175 pg/mL — ABNORMAL HIGH (ref 0.0–100.0)

## 2016-09-29 MED ORDER — ASPIRIN EC 81 MG PO TBEC: 81.0000 mg | DELAYED_RELEASE_TABLET | Freq: Every day | ORAL | 3 refills | Status: DC

## 2016-09-29 MED ORDER — FUROSEMIDE 40 MG PO TABS: 40.0000 mg | ORAL_TABLET | Freq: Every day | ORAL | 3 refills | Status: DC

## 2016-09-29 NOTE — Patient Instructions (Signed)
Start Furosemide 40 mg daily  Start Aspirin 81 mg daily  Labs today  Labs in 2 weeks  You have been referred to [facility]  Your physician recommends that you schedule a follow-up appointment in: 1 month

## 2016-09-29 NOTE — Addendum Note (Signed)
Encounter addended by: Larey Dresser, MD on: 09/29/2016  9:15 PM<BR>    Actions taken: Sign clinical note

## 2016-09-29 NOTE — Progress Notes (Addendum)
PCP: Lennice Sites HF Cardiology: Dr. Aundra Dubin  82 yo with history of paroxysmal atrial fibrillation and severe mitral regurgitation s/p minimally invasive MV repair and Maze in 11/14 presents for cardiology evaluation.  He has been followed at [facility] and I do not have recent records available.  He developed increased exertional dyspnea over the last 6 months.  He was short of breath walking a short distance or doing housework like vacuuming.  He also had a TIA apparently several months ago and was started on apixaban 2.5 mg bid (he had not been anticoagulated since around the time of his prior surgery). He was sent back to Dr Roxy Manns for re-evaluation, there was concern for stenosis of the repaired mitral valve.  He had a TEE in 10/17 showing EF 45-50% range with moderate to moderate-severe tricuspid regurgitation, mildly decreased RV systolic function, and possibly moderate stenosis of the repaired mitral valve (mild to moderate regurgitation).  Most significantly, there was a layered thrombus along the left atrial wall.  I increased his dose of apixaban to 5 mg bid at that time. He was seen in followup by Dr. Roxy Manns, no plan for redo surgery at this time.  Event monitor in 10/17 showed no atrial fibrillation.   Initially after leaving the hospital, he felt much better.  However, after stopping verapamil and starting Coreg, he felt considerably worse (more dyspnea).  I had him stop the Coreg.  He initially felt a lot better off Coreg.  However, over the last few weeks, he has again developed exertional dyspnea. He has to stop twice walking up at flight of stairs.  Dyspnea pulling garbage can up a hill.  No chest pain.  No lightheadedness or palpitations.  No orthopnea/PND.  He had an echo done at [facility] recently. He says that he was told it was "ok."    Additionally, several days ago, he had an episode lasting for about a minute where his left arm and leg were weak.  This completely  resolved and has not recurred.   Labs (10/17): K 4.8, creatinine 2.08, BNP 43, hgb 15.6 Labs (12/17): K 4.5, creatinine 2.36, HCT 46.3, plts 105  ECG: ectopic atrial rhythm HR 80 with PVCs, inferior Qs, iRBBB  PMH: 1. Mitral regurgitation: s/p minimally invasive mitral valve repair in 11/14 (Dr Roxy Manns).  He has developed stenosis of the repaired valve.  - TEE (10/17) with EF 45-50%, mild diffuse hypokinesis, mildly dilated RV with mildly decreased systolic function, moderate to moderate-severe tricuspid regurgitation, peak RV-RA gradient 31 mmHg, mild AI, mitral valve s/p repair with mild-moderate MR, posterior leaflet relatively fixed with mean gradient 6 mmHg and MVA 1.22 cm^2 => possible moderate mitral stenosis; there was layered thrombus along the wall of the left atrium.  2. TIA: MRI head 10/17 did not show CVA.  3. Atrial fibrillation: Paroxysmal.  S/p Maze procedure with MV repair in 11/14.  - Event monitor (10/17): No atrial fibrillation 4. CKD 5. GERD 6. HTN 7. CAD: LHC (2014) with 70% proximal stenosis in a small high diagonal, otherwise no significant disease.  - Cardiolite (6/17): No ischemia or infarction.  8. Chronic thrombocytopenia  SH: Lives in [place], not married but has girlfriend, 1-2 glasses wine/night, nonsmoker.   Family History  Problem Relation Age of Onset  . Diabetes    . Hypertension    . Coronary artery disease     ROS: All systems reviewed and negative except as per HPI.  Current Outpatient Prescriptions  Medication Sig Dispense Refill  . apixaban (ELIQUIS) 5 MG TABS tablet Take 1 tablet (5 mg total) by mouth 2 (two) times daily. 60 tablet 3  . losartan (COZAAR) 50 MG tablet Take 1 tablet (50 mg total) by mouth daily.    Marland Kitchen aspirin EC 81 MG tablet Take 1 tablet (81 mg total) by mouth daily. 90 tablet 3  . furosemide (LASIX) 40 MG tablet Take 1 tablet (40 mg total) by mouth daily. 30 tablet 3  . nitroGLYCERIN (NITROSTAT) 0.4 MG SL tablet Place 0.4 mg  under the tongue every 5 (five) minutes as needed for chest pain.     No current facility-administered medications for this encounter.    BP (!) 144/80   Pulse 69   Wt 240 lb 12 oz (109.2 kg)   SpO2 100%   BMI 30.91 kg/m  General: NAD Neck: JVP 8 cm, no thyromegaly or thyroid nodule.  Lungs: Clear to auscultation bilaterally with normal respiratory effort. CV: Nondisplaced PMI.  Heart regular S1/S2, no S3/S4, 1/6 HSM LLSB.  Trace ankle edema.  No carotid bruit.  Normal pedal pulses.  Abdomen: Soft, nontender, no hepatosplenomegaly, no distention.  Skin: Intact without lesions or rashes.  Neurologic: Alert and oriented x 3.  Psych: Normal affect. Extremities: No clubbing or cyanosis.  HEENT: Normal.   Assessment/Plan: 1. Exertional dyspnea: Patient had TEE 10/17 with EF 45-50% and probably moderate stenosis of the repaired mitral valve.  Discussed mitral stenosis in the past with Dr Roxy Manns.  Stenosis is not severe, probably in the moderate range post-MV repair with mean gradient only 6 mmHg.  Would hold off on surgical re-intervention for now though he may need in the future.  Increased dyspnea over the last few weeks. On exam, suspect mild volume overload.  Weight is up 5 lbs since last appointment. Echo was done at [facility] recently, he says he was told it looked "ok."   - Start Lasix 40 mg daily to see if this helps his symptoms.  Check BMET and BNP today, BMET again in 10 days.  - I will try to get a copy of the outside facility echo.  2. Atrial fibrillation: Paroxysmal.  Had Maze in 11/14.  He was off anticoagulation until a TIA earlier this year and was started on apixaban 2.5 mg bid.  TEE while on the low dose apixaban showed a layered mural thrombus.  As he was under-dosed on apixaban, it was increased to 5 mg bid. Event monitor in 10/17 did not show atrial fibrillation.  Today, he is in an ectopic atrial rhythm (not fibrillation or flutter).  Rate is good around 80 bpm.   - Given valvular atrial fibrillation (now with mitral stenosis), he should ideally be on warfarin, especially given suspected TIA several days ago while compliant with Eliquis.  In the past, he has refused warfarin.  However, now he is open to it.  I would not change it right now given possible recent TIA to avoid gaps in anticoagulation.  Would wait about a month then transition to to warfarin.  He will have warfarin followed at PCP's office.  3. Cardiomyopathy: Mild, EF 45-50% on recent TEE.  Suspect nonischemic cardiomyopathy.  - Coreg was stopped as this appears to have caused exercise intolerance.   - He is on losartan 50 mg daily, will continue (need to follow creatinine closely).  BMET today.   4. Mitral valve disorder: History of mitral regurgitation, now s/p minimally invasive MV repair.  He has moderate stenosis of the repaired valve, mean gradient 6 mmHg.  As above, for now will observe without redo operation.   - I will have him get an echo here in about 6 months.  5. CKD: Stage III.  Creatinine around 2.  He has seen Dr. Justin Mend.  Repeat BMET today.  6. TIA: I suspect Mr Mcmann had another TIA a few days ago with transient left-sided weakness.  TEE in 10/17 showed layered LA thrombus.  He has been on Eliquis 5 mg bid.  He has been compliant on Eliquis.  - Given TIA on Eliquis, for now will add ASA 81 mg daily.  - In about a month or so, will transition from Eliquis to warfarin.  - I am going to refer him for neurology evaluation.   Followup in 1 month.   [name] 09/29/2016

## 2016-10-03 ENCOUNTER — Encounter: Payer: Medicare HMO | Admitting: Thoracic Surgery (Cardiothoracic Vascular Surgery)

## 2016-10-11 ENCOUNTER — Encounter: Payer: Self-pay | Admitting: Neurology

## 2016-10-11 ENCOUNTER — Other Ambulatory Visit (INDEPENDENT_AMBULATORY_CARE_PROVIDER_SITE_OTHER): Payer: Medicare HMO

## 2016-10-11 ENCOUNTER — Ambulatory Visit (INDEPENDENT_AMBULATORY_CARE_PROVIDER_SITE_OTHER): Payer: Medicare HMO | Admitting: Neurology

## 2016-10-11 VITALS — BP 140/80 | HR 58 | Ht 74.0 in | Wt 233.5 lb

## 2016-10-11 DIAGNOSIS — I342 Nonrheumatic mitral (valve) stenosis: Secondary | ICD-10-CM

## 2016-10-11 DIAGNOSIS — I10 Essential (primary) hypertension: Secondary | ICD-10-CM

## 2016-10-11 DIAGNOSIS — E785 Hyperlipidemia, unspecified: Secondary | ICD-10-CM

## 2016-10-11 DIAGNOSIS — G459 Transient cerebral ischemic attack, unspecified: Secondary | ICD-10-CM

## 2016-10-11 DIAGNOSIS — I4891 Unspecified atrial fibrillation: Secondary | ICD-10-CM

## 2016-10-11 LAB — LIPID PANEL
CHOLESTEROL: 173 mg/dL (ref 0–200)
HDL: 39.7 mg/dL (ref >39.00)
LDL CALC: 104 mg/dL — AB (ref 0–99)
NONHDL: 133.23
Total CHOL/HDL Ratio: 4
Triglycerides: 144 mg/dL (ref 0.0–149.0)
VLDL: 28.8 mg/dL (ref 0.0–40.0)

## 2016-10-11 LAB — HEPATIC FUNCTION PANEL
ALT: 21 U/L (ref 0–53)
AST: 25 U/L (ref 0–37)
Albumin: 4.3 g/dL (ref 3.5–5.2)
Alkaline Phosphatase: 53 U/L (ref 39–117)
BILIRUBIN DIRECT: 0.2 mg/dL (ref 0.0–0.3)
BILIRUBIN TOTAL: 1.1 mg/dL (ref 0.2–1.2)
Total Protein: 7.4 g/dL (ref 6.0–8.3)

## 2016-10-11 NOTE — Patient Instructions (Addendum)
1.  We will check a fasting lipid panel and hepatic panel 2.  Will check carotid doppler 3.  Agree with switching back to warfarin 4.  Mediterranean diet  Mediterranean Diet A Mediterranean diet refers to food and lifestyle choices that are based on the traditions of countries located on the [employer]. This way of eating has been shown to help prevent certain conditions and improve outcomes for people who have chronic diseases, like kidney disease and heart disease. What are tips for following this plan? Lifestyle  Cook and eat meals together with your family, when possible.  Drink enough fluid to keep your urine clear or pale yellow.  Be physically active every day. This includes:  Aerobic exercise like running or swimming.  Leisure activities like gardening, walking, or housework.  Get 7-8 hours of sleep each night.  If recommended by your health care provider, drink red wine in moderation. This means 1 glass a day for nonpregnant women and 2 glasses a day for men. A glass of wine equals 5 oz (150 mL). Reading food labels  Check the serving size of packaged foods. For foods such as rice and pasta, the serving size refers to the amount of cooked product, not dry.  Check the total fat in packaged foods. Avoid foods that have saturated fat or trans fats.  Check the ingredients list for added sugars, such as corn syrup. Shopping  At the grocery store, buy most of your food from the areas near the walls of the store. This includes:  Fresh fruits and vegetables (produce).  Grains, beans, nuts, and seeds. Some of these may be available in unpackaged forms or large amounts (in bulk).  Fresh seafood.  Poultry and eggs.  Low-fat dairy products.  Buy whole ingredients instead of prepackaged foods.  Buy fresh fruits and vegetables in-season from local farmers markets.  Buy frozen fruits and vegetables in resealable bags.  If you do not have access to quality fresh  seafood, buy precooked frozen shrimp or canned fish, such as tuna, salmon, or sardines.  Buy small amounts of raw or cooked vegetables, salads, or olives from the deli or salad bar at your store.  Stock your pantry so you always have certain foods on hand, such as olive oil, canned tuna, canned tomatoes, rice, pasta, and beans. Cooking  Cook foods with extra-virgin olive oil instead of using butter or other vegetable oils.  Have meat as a side dish, and have vegetables or grains as your main dish. This means having meat in small portions or adding small amounts of meat to foods like pasta or stew.  Use beans or vegetables instead of meat in common dishes like chili or lasagna.  Experiment with different cooking methods. Try roasting or broiling vegetables instead of steaming or sauteing them.  Add frozen vegetables to soups, stews, pasta, or rice.  Add nuts or seeds for added healthy fat at each meal. You can add these to yogurt, salads, or vegetable dishes.  Marinate fish or vegetables using olive oil, lemon juice, garlic, and fresh herbs. Meal planning  Plan to eat 1 vegetarian meal one day each week. Try to work up to 2 vegetarian meals, if possible.  Eat seafood 2 or more times a week.  Have healthy snacks readily available, such as:  Vegetable sticks with hummus.  Greek yogurt.  Fruit and nut trail mix.  Eat balanced meals throughout the week. This includes:  Fruit: 2-3 servings a day  Vegetables: 4-5 servings  a day  Low-fat dairy: 2 servings a day  Fish, poultry, or lean meat: 1 serving a day  Beans and legumes: 2 or more servings a week  Nuts and seeds: 1-2 servings a day  Whole grains: 6-8 servings a day  Extra-virgin olive oil: 3-4 servings a day  Limit red meat and sweets to only a few servings a month What are my food choices?  Mediterranean diet  Recommended  Grains: Whole-grain pasta. Brown rice. Bulgar wheat. Polenta. Couscous. Whole-wheat  bread. [place].  Vegetables: Artichokes. Beets. Broccoli. Cabbage. Carrots. Eggplant. Green beans. Chard. Kale. Spinach. Onions. Leeks. Peas. Squash. Tomatoes. Peppers. Radishes.  Fruits: Apples. Apricots. Avocado. Berries. Bananas. Cherries. Dates. Figs. Grapes. Lemons. Melon. Oranges. Peaches. Plums. Pomegranate.  Meats and other protein foods: Beans. Almonds. Sunflower seeds. Pine nuts. Peanuts. [place]. Salmon. Scallops. Shrimp. [place]. Tilapia. Clams. Oysters. Eggs.  Dairy: Low-fat milk. Cheese. Greek yogurt.  Beverages: Water. Red wine. Herbal tea.  Fats and oils: Extra virgin olive oil. Avocado oil. Grape seed oil.  Sweets and desserts: [place] yogurt with honey. Baked apples. Poached pears. Trail mix.  Seasoning and other foods: Basil. Cilantro. Coriander. Cumin. Mint. Parsley. Sage. Rosemary. Tarragon. Garlic. Oregano. Thyme. Pepper. Balsalmic vinegar. Tahini. Hummus. Tomato sauce. Olives. Mushrooms.  Limit these  Grains: Prepackaged pasta or rice dishes. Prepackaged cereal with added sugar.  Vegetables: Deep fried potatoes (french fries).  Fruits: Fruit canned in syrup.  Meats and other protein foods: Beef. Pork. Lamb. Poultry with skin. Hot dogs. Berniece Salines.  Dairy: Ice cream. Sour cream. Whole milk.  Beverages: Juice. Sugar-sweetened soft drinks. Beer. Liquor and spirits.  Fats and oils: Butter. Canola oil. Vegetable oil. Beef fat (tallow). Lard.  Sweets and desserts: Cookies. Cakes. Pies. Candy.  Seasoning and other foods: Mayonnaise. Premade sauces and marinades.  The items listed may not be a complete list. Talk with your dietitian about what dietary choices are right for you. Summary  The Mediterranean diet includes both food and lifestyle choices.  Eat a variety of fresh fruits and vegetables, beans, nuts, seeds, and whole grains.  Limit the amount of red meat and sweets that you eat.  Talk with your health care provider about whether it is safe for you to  drink red wine in moderation. This means 1 glass a day for nonpregnant women and 2 glasses a day for men. A glass of wine equals 5 oz (150 mL). This information is not intended to replace advice given to you by your health care provider. Make sure you discuss any questions you have with your health care provider. Document Released: 05/05/2016 Document Revised: 06/07/2016 Document Reviewed: 05/05/2016 Elsevier Interactive Patient Education  2017 [place]. 5.  Follow up in 4 months.

## 2016-10-11 NOTE — Progress Notes (Signed)
NEUROLOGY CONSULTATION NOTE  Daniel Mayo MRN: [id number] DOB: 09-19-1935  Referring provider: Dr. Aundra Dubin Primary care provider: Dr. Claudie Leach  Reason for consult:  TIA  HISTORY OF PRESENT ILLNESS: Daniel Mayo is a 82 year old right-handed male with paroxysmal atrial fibrillation status post Maze procedure, severe mitral regurgitation, HTN, CAD, CKD and GERD presents for transient ischemic attack.  History, including symptoms, supplemented by his friend who accompanies him.  In October, he had a suspected TIA in which he developed left perioral numbness with drooling down the left side of his mouth.  There was no associated headache or unilateral extremity weakness.  He reportedly had a brain scan which was negative for acute stroke.  Event monitor from 10/17 showed no atrial fibrillation.  TEE from 10/17 demonstrated EF 45-50%, mild diffuse hypokinesis, mildly dilated RV with mildly decreased systolic function, moderate to moderate-severe tricuspid regurgitation, mitral valve s/p repair with mild-moderate MR, possible moderate mitral stenosis.  It also demonstrated a layered thrombus along the wall of the left atrium, so Eliquis (which he has been on for a year for PAF) was increased from 2.5mg  twice daily to 5mg  twice daily.  A couple of weeks ago, he was sitting in his chair reading when he realized he forgot to take his blood pressure medication.  He noted difficulty trying to stand up.  He stood up and immediately noted numbness and weakness of his entire left leg.  There was no associated facial droop, headache, dizziness, vision loss or arm weakness.  He sat back down and the event resolved after half a minute.  ASA 81mg  daily was added by his cardiologist, but he stopped taking it due to nose bleeds.  The plan is to transition from apixaban to warfarin in the next month.  He has not had a recurrent event. He also has cervical disc disease and history of lumbar stenosis status post  surgery with residual chronic back pain and peripheral neuropathy.  BMP 09/29/16:  Na 140, K 4.6, Cl 108, glucose 104, BUN 35, Cr 2.29  PAST MEDICAL HISTORY: Past Medical History:  Diagnosis Date  . AF (atrial fibrillation) ([place])   . Allergic rhinitis   . Atrial fibrillation ([place]) 02/17/2010   Persistent atrial fibrillation    . Chronic back pain   . Chronic kidney disease (CKD)   . CKD (chronic kidney disease)   . Coronary artery disease   . Elevated PSA   . Erectile dysfunction   . GERD (gastroesophageal reflux disease)   . HOH (hard of hearing)   . Hyperlipidemia   . Hypertension   . Hypogonadism male   . Insomnia   . Left ventricular hypertrophy   . Mitral regurgitation 07/12/2013  . Mitral regurgitation   . Renal disorder   . S/P Maze operation for atrial fibrillation 08/14/2013   Complete bilateral atrial lesion set using cryothermy and bipolar radiofrequency ablation  . S/P minimally invasive mitral valve repair + maze procedure 08/14/2013   28 mm Sorin Memo 3D ring annuloplasty via right mini thoracotomy approach  . SOB (shortness of breath)   . Somnolence   . Stenosis of carotid artery   . Tricuspid regurgitation     PAST SURGICAL HISTORY: Past Surgical History:  Procedure Laterality Date  . CARDIAC CATHETERIZATION  x 2  . CARDIOVERSION  02/26/2010  . CORONARY ANGIOGRAM  08/06/2013   Procedure: CORONARY ANGIOGRAM;  Surgeon: Lorretta Harp, MD;  Location: [facility] CATH LAB;  Service: Cardiovascular;;  . HERNIA  REPAIR Bilateral   . INTRAOPERATIVE TRANSESOPHAGEAL ECHOCARDIOGRAM N/A 08/14/2013   Procedure: INTRAOPERATIVE TRANSESOPHAGEAL ECHOCARDIOGRAM;  Surgeon: Rexene Alberts, MD;  Location: [place];  Service: Open Heart Surgery;  Laterality: N/A;  . LUMBAR SPINE SURGERY    . MINIMALLY INVASIVE MAZE PROCEDURE N/A 08/14/2013   Procedure: MINIMALLY INVASIVE MAZE PROCEDURE;  Surgeon: Rexene Alberts, MD;  Location: [place];  Service: Open Heart Surgery;  Laterality: N/A;  .  MITRAL VALVE REPLACEMENT Right 08/14/2013   Procedure: MINIMALLY INVASIVE MITRAL VALVE (MV)  REPAIR;  Surgeon: Rexene Alberts, MD;  Location: [place];  Service: Open Heart Surgery;  Laterality: Right;  . TEE WITHOUT CARDIOVERSION N/A 06/29/2016   Procedure: TRANSESOPHAGEAL ECHOCARDIOGRAM (TEE);  Surgeon: Larey Dresser, MD;  Location: [place];  Service: Cardiovascular;  Laterality: N/A;  . TONSILLECTOMY  age 9    MEDICATIONS: Current Outpatient Prescriptions on File Prior to Visit  Medication Sig Dispense Refill  . apixaban (ELIQUIS) 5 MG TABS tablet Take 1 tablet (5 mg total) by mouth 2 (two) times daily. 60 tablet 3  . aspirin EC 81 MG tablet Take 1 tablet (81 mg total) by mouth daily. 90 tablet 3  . furosemide (LASIX) 40 MG tablet Take 1 tablet (40 mg total) by mouth daily. 30 tablet 3  . losartan (COZAAR) 50 MG tablet Take 1 tablet (50 mg total) by mouth daily.    . nitroGLYCERIN (NITROSTAT) 0.4 MG SL tablet Place 0.4 mg under the tongue every 5 (five) minutes as needed for chest pain.     No current facility-administered medications on file prior to visit.     ALLERGIES: No Known Allergies  FAMILY HISTORY: Family History  Problem Relation Age of Onset  . Diabetes    . Hypertension    . Coronary artery disease      SOCIAL HISTORY: Social History   Social History  . Marital status: Single    Spouse name: N/A  . Number of children: N/A  . Years of education: N/A   Occupational History  . retired    Social History Main Topics  . Smoking status: Never Smoker  . Smokeless tobacco: Never Used     Comment: stopped smoking in his 20's  . Alcohol use 8.4 oz/week    14 Glasses of wine per week     Comment: 1- 2 glasses of wine a night  . Drug use: No  . Sexual activity: Not on file   Other Topics Concern  . Not on file   Social History Narrative  . No narrative on file    REVIEW OF SYSTEMS: Constitutional: No fevers, chills, or sweats, no generalized  fatigue, change in appetite Eyes: No visual changes, double vision, eye pain Ear, nose and throat: No hearing loss, ear pain, nasal congestion, sore throat Cardiovascular: No chest pain, palpitations Respiratory:  Shortness of breath on exertion GastrointestinaI: No nausea, vomiting, diarrhea, abdominal pain, fecal incontinence Genitourinary:  No dysuria, urinary retention or frequency Musculoskeletal:  Neck and back pain Integumentary: No rash, pruritus, skin lesions Neurological: as above Psychiatric: No depression, insomnia, anxiety Endocrine: No palpitations, fatigue, diaphoresis, mood swings, change in appetite, change in weight, increased thirst Hematologic/Lymphatic:  No purpura, petechiae. Allergic/Immunologic: no itchy/runny eyes, nasal congestion, recent allergic reactions, rashes  PHYSICAL EXAM: Vitals:   10/11/16 0756  BP: 140/80  Pulse: (!) 58   General: No acute distress.  Patient appears well-groomed.  Head:  Normocephalic/atraumatic Eyes:  fundi examined but not visualized Neck: supple,  no paraspinal tenderness, full range of motion Back: No paraspinal tenderness Heart: regular rate and rhythm Lungs: Clear to auscultation bilaterally. Vascular: No carotid bruits. Neurological Exam: Mental status: alert and oriented to person, place, and time, recent and remote memory intact, fund of knowledge intact, attention and concentration intact, speech fluent and not dysarthric, language intact. Cranial nerves: CN I: not tested CN II: pupils equal, round and reactive to light, visual fields intact CN III, IV, VI:  full range of motion, no nystagmus, no ptosis CN V: facial sensation intact CN VII: upper and lower face symmetric CN VIII: hearing intact CN IX, X: gag intact, uvula midline CN XI: sternocleidomastoid and trapezius muscles intact CN XII: tongue midline Bulk & Tone: normal, no fasciculations. Motor:  5/5 throughout  Sensation:  Decreased pinprick and  vibration sensation in feet.. Deep Tendon Reflexes:  2+ upper extremities, trace patellars, absent ankles, toes downgoing.  Finger to nose testing:  Without dysmetria.  Heel to shin:  Without dysmetria.  Gait:  Normal station and stride.  Able to turn and tandem walk. Romberg with sway.  IMPRESSION: Probable transient ischemic attack Non-rheumatic mitral valve stenosis Atrial fibrillation Coronary artery disease hypertension  PLAN: 1.  Given the thrombus, agree with switching from Eliquis to warfarin. 2.  Will check carotid doppler 3.  Will check fasting lipid panel 4.  Mediterranean diet 5.  Continue blood pressure control 6.  At this point, we can hold off on ASA since it is causing nose bleeds.  ASA may be beneficial since we cannot rule out that the TIA is due to small vessel disease.  However, there is greater risk for bleeding.  At this point, he wishes to hold off on it. 7.  Follow up in 4 months.  Thank you for allowing me to take part in the care of this patient.  Metta Clines, DO  CC:  Lawson Radar, MD  [name], MD

## 2016-10-13 ENCOUNTER — Other Ambulatory Visit (HOSPITAL_COMMUNITY): Payer: Medicare HMO

## 2016-10-14 ENCOUNTER — Ambulatory Visit (HOSPITAL_COMMUNITY)
Admission: RE | Admit: 2016-10-14 | Discharge: 2016-10-14 | Disposition: A | Discharge status: Home / Self Care | Payer: Medicare HMO | Source: Ambulatory Visit | Attending: Internal Medicine | Admitting: Internal Medicine

## 2016-10-14 DIAGNOSIS — R0602 Shortness of breath: Secondary | ICD-10-CM | POA: Diagnosis not present

## 2016-10-14 DIAGNOSIS — I4891 Unspecified atrial fibrillation: Secondary | ICD-10-CM | POA: Diagnosis present

## 2016-10-14 LAB — BASIC METABOLIC PANEL
ANION GAP: 9 (ref 5–15)
BUN: 44 mg/dL — ABNORMAL HIGH (ref 6–20)
CALCIUM: 9.7 mg/dL (ref 8.9–10.3)
CO2: 28 mmol/L (ref 22–32)
CREATININE: 2.5 mg/dL — AB (ref 0.61–1.24)
Chloride: 105 mmol/L (ref 101–111)
GFR calc non Af Amer: 23 mL/min — ABNORMAL LOW (ref >60)
GFR, EST AFRICAN AMERICAN: 27 mL/min — AB (ref >60)
Glucose, Bld: 107 mg/dL — ABNORMAL HIGH (ref 65–99)
Potassium: 4.1 mmol/L (ref 3.5–5.1)
SODIUM: 142 mmol/L (ref 135–145)

## 2016-10-17 ENCOUNTER — Ambulatory Visit (HOSPITAL_COMMUNITY)
Admission: RE | Admit: 2016-10-17 | Discharge: 2016-10-17 | Disposition: A | Discharge status: Home / Self Care | Payer: Medicare HMO | Source: Ambulatory Visit | Attending: Internal Medicine | Admitting: Internal Medicine

## 2016-10-17 DIAGNOSIS — G459 Transient cerebral ischemic attack, unspecified: Secondary | ICD-10-CM | POA: Diagnosis not present

## 2016-10-17 DIAGNOSIS — I6523 Occlusion and stenosis of bilateral carotid arteries: Secondary | ICD-10-CM

## 2016-10-18 ENCOUNTER — Telehealth: Payer: Self-pay

## 2016-10-18 DIAGNOSIS — E785 Hyperlipidemia, unspecified: Secondary | ICD-10-CM

## 2016-10-18 MED ORDER — SIMVASTATIN 20 MG PO TABS: 20.0000 mg | ORAL_TABLET | Freq: Every day | ORAL | 2 refills | Status: DC

## 2016-10-18 NOTE — Telephone Encounter (Signed)
-----   Message from Pieter Partridge, DO sent at 10/18/2016 12:08 PM EST ----- Carotid doppler showed no significant narrowing of the carotid arteries On cholesterol test, the LDL (bad cholesterol) is 104.  I would like it lower (ideally less than 70).  Therefore, I would like to start simvastatin 20mg  daily and repeat fasting lipid panel in 4 months (about a week prior to follow up)

## 2016-10-18 NOTE — Telephone Encounter (Signed)
Called patient. Gave lab results and instructions. Patient verbalized understanding.  Scheduled lab appt. Verified pharmacy, sent Rx.

## 2016-10-31 ENCOUNTER — Encounter (HOSPITAL_COMMUNITY): Payer: Self-pay

## 2016-10-31 ENCOUNTER — Telehealth (HOSPITAL_COMMUNITY): Payer: Self-pay | Admitting: *Deleted

## 2016-10-31 ENCOUNTER — Ambulatory Visit (HOSPITAL_COMMUNITY)
Admission: RE | Admit: 2016-10-31 | Discharge: 2016-10-31 | Disposition: A | Discharge status: Home / Self Care | Payer: Medicare HMO | Source: Ambulatory Visit | Attending: Cardiology | Admitting: Cardiology

## 2016-10-31 ENCOUNTER — Telehealth: Payer: Self-pay | Admitting: Cardiology

## 2016-10-31 VITALS — BP 140/76 | HR 70 | Wt 238.0 lb

## 2016-10-31 DIAGNOSIS — Z79899 Other long term (current) drug therapy: Secondary | ICD-10-CM | POA: Insufficient documentation

## 2016-10-31 DIAGNOSIS — R079 Chest pain, unspecified: Secondary | ICD-10-CM | POA: Diagnosis not present

## 2016-10-31 DIAGNOSIS — R0602 Shortness of breath: Secondary | ICD-10-CM

## 2016-10-31 DIAGNOSIS — Z9889 Other specified postprocedural states: Secondary | ICD-10-CM | POA: Insufficient documentation

## 2016-10-31 DIAGNOSIS — I340 Nonrheumatic mitral (valve) insufficiency: Secondary | ICD-10-CM | POA: Diagnosis not present

## 2016-10-31 DIAGNOSIS — N183 Chronic kidney disease, stage 3 unspecified: Secondary | ICD-10-CM

## 2016-10-31 DIAGNOSIS — I509 Heart failure, unspecified: Secondary | ICD-10-CM

## 2016-10-31 DIAGNOSIS — Z7982 Long term (current) use of aspirin: Secondary | ICD-10-CM | POA: Diagnosis not present

## 2016-10-31 DIAGNOSIS — I4891 Unspecified atrial fibrillation: Secondary | ICD-10-CM | POA: Diagnosis not present

## 2016-10-31 LAB — CBC
HCT: 44.2 % (ref 39.0–52.0)
Hemoglobin: 15.2 g/dL (ref 13.0–17.0)
MCH: 33.9 pg (ref 26.0–34.0)
MCHC: 34.4 g/dL (ref 30.0–36.0)
MCV: 98.7 fL (ref 78.0–100.0)
PLATELETS: 96 10*3/uL — AB (ref 150–400)
RBC: 4.48 MIL/uL (ref 4.22–5.81)
RDW: 13.4 % (ref 11.5–15.5)
WBC: 8.5 10*3/uL (ref 4.0–10.5)

## 2016-10-31 LAB — BASIC METABOLIC PANEL
Anion gap: 10 (ref 5–15)
BUN: 38 mg/dL — AB (ref 6–20)
CALCIUM: 9.7 mg/dL (ref 8.9–10.3)
CO2: 27 mmol/L (ref 22–32)
CREATININE: 2.65 mg/dL — AB (ref 0.61–1.24)
Chloride: 106 mmol/L (ref 101–111)
GFR calc Af Amer: 25 mL/min — ABNORMAL LOW (ref >60)
GFR, EST NON AFRICAN AMERICAN: 21 mL/min — AB (ref >60)
GLUCOSE: 107 mg/dL — AB (ref 65–99)
Potassium: 4.8 mmol/L (ref 3.5–5.1)
SODIUM: 143 mmol/L (ref 135–145)

## 2016-10-31 LAB — BRAIN NATRIURETIC PEPTIDE: B NATRIURETIC PEPTIDE 5: 64.8 pg/mL (ref 0.0–100.0)

## 2016-10-31 NOTE — Telephone Encounter (Signed)
Per Charmaine - need to reschedule stress test due to insurance is still pending and patient was schedule for 08-31-17 @ 7:45.   Spoke with patient and he agreed to test being reschedule to   11-04-15 @ 7:45.

## 2016-10-31 NOTE — Patient Instructions (Signed)
Labs today  Your physician has requested that you have a lexiscan myoview. For further information please visit [email or web address]. Please follow instruction sheet, as given.  Your physician recommends that you schedule a follow-up appointment in: 6 weeks

## 2016-10-31 NOTE — Telephone Encounter (Signed)
Patient given detailed instructions per Myocardial Perfusion Study Information Sheet for the test on 11/01/16 at 0745. Patient notified to arrive 15 minutes early and that it is imperative to arrive on time for appointment to keep from having the test rescheduled.  If you need to cancel or reschedule your appointment, please call the office within 24 hours of your appointment. Failure to do so may result in a cancellation of your appointment, and a $50 no show fee. Patient verbalized understanding.Terese Heier, Ranae Palms

## 2016-10-31 NOTE — Telephone Encounter (Signed)
[place], [place]

## 2016-10-31 NOTE — Progress Notes (Signed)
PCP: Lennice Sites HF Cardiology: Dr. Aundra Dubin  82 yo with history of paroxysmal atrial fibrillation and severe mitral regurgitation s/p minimally invasive MV repair and Maze in 11/14 presents for cardiology evaluation.  He has been followed at [facility] and I do not have recent records available.  He developed increased exertional dyspnea over the last 6 months.  He was short of breath walking a short distance or doing housework like vacuuming.  He also had a TIA apparently several months ago and was started on apixaban 2.5 mg bid (he had not been anticoagulated since around the time of his prior surgery). He was sent back to Dr Roxy Manns for re-evaluation, there was concern for stenosis of the repaired mitral valve.  He had a TEE in 10/17 showing EF 45-50% range with moderate to moderate-severe tricuspid regurgitation, mildly decreased RV systolic function, and possibly moderate stenosis of the repaired mitral valve (mild to moderate regurgitation).  Most significantly, there was a layered thrombus along the left atrial wall.  I increased his dose of apixaban to 5 mg bid at that time. He was seen in followup by Dr. Roxy Manns, no plan for redo surgery at this time.  Event monitor in 10/17 showed no atrial fibrillation.   Initially after leaving the hospital, he felt much better.  However, after stopping verapamil and starting Coreg, he felt considerably worse (more dyspnea).  I had him stop the Coreg.  He initially felt a lot better off Coreg.  At last appointment, however, he reported increase again in dyspnea.  I thought that he looked volume overloaded and started him on Lasix 40 mg daily.  Weight is down 2 lbs. His breathing has improved.  No dyspnea walking about 100 yards on flat ground.  Short of breath still walking up incline or bending over.  He had an episode of chest pain at rest 3-4 days ago lasting about a minute. No further TIA-like episodes.     Labs (10/17): K 4.8, creatinine 2.08, BNP 43, hgb  15.6 Labs (12/17): K 4.5, creatinine 2.36, HCT 46.3, plts 105 Labs (1/18): K 4.1, creatinine 2.5, LDL 104, BNP 175  ECG: ectopic atrial rhythm, PVCs, iRBBB  PMH: 1. Mitral regurgitation: s/p minimally invasive mitral valve repair in 11/14 (Dr Roxy Manns).  He has developed stenosis of the repaired valve.  - TEE (10/17) with EF 45-50%, mild diffuse hypokinesis, mildly dilated RV with mildly decreased systolic function, moderate to moderate-severe tricuspid regurgitation, peak RV-RA gradient 31 mmHg, mild AI, mitral valve s/p repair with mild-moderate MR, posterior leaflet relatively fixed with mean gradient 6 mmHg and MVA 1.22 cm^2 => possible moderate mitral stenosis; there was layered thrombus along the wall of the left atrium.  - Echo (12/17, [place] => images not available): Per report, EF 40-45%, mild LVH, severe LAE, s/p MV repair with "moderate mitral stenosis" but MVA 1.7 cm^2 via PHT, mild MR, moderate TR, PASP 45 mmHg.  2. TIA: MRI head 10/17 did not show CVA.  - 1/18 carotid dopplers: No significant stenosis.  3. Atrial fibrillation: Paroxysmal.  S/p Maze procedure with MV repair in 11/14.  - Event monitor (10/17): No atrial fibrillation 4. CKD 5. GERD 6. HTN 7. CAD: LHC (2014) with 70% proximal stenosis in a small high diagonal, otherwise no significant disease.  - Cardiolite (6/17): No ischemia or infarction.  8. Chronic thrombocytopenia  SH: Lives in [place], not married but has girlfriend, 1-2 glasses wine/night, nonsmoker.   Family History  Problem Relation Age of  Onset  . Diabetes    . Hypertension    . Coronary artery disease     ROS: All systems reviewed and negative except as per HPI.  Current Outpatient Prescriptions  Medication Sig Dispense Refill  . apixaban (ELIQUIS) 5 MG TABS tablet Take 1 tablet (5 mg total) by mouth 2 (two) times daily. 60 tablet 3  . aspirin EC 81 MG tablet Take 1 tablet (81 mg total) by mouth daily. 90 tablet 3  . furosemide (LASIX) 40 MG  tablet Take 1 tablet (40 mg total) by mouth daily. 30 tablet 3  . losartan (COZAAR) 50 MG tablet Take 1 tablet (50 mg total) by mouth daily.    . simvastatin (ZOCOR) 20 MG tablet Take 1 tablet (20 mg total) by mouth daily. 30 tablet 2  . nitroGLYCERIN (NITROSTAT) 0.4 MG SL tablet Place 0.4 mg under the tongue every 5 (five) minutes as needed for chest pain.     No current facility-administered medications for this encounter.    BP 140/76   Pulse 70   Wt 238 lb (108 kg)   SpO2 99%   BMI 30.56 kg/m  General: NAD Neck: JVP 7 cm, no thyromegaly or thyroid nodule.  Lungs: Clear to auscultation bilaterally with normal respiratory effort. CV: Nondisplaced PMI.  Heart regular S1/S2, no S3/S4, 1/6 HSM LLSB.  No edema.  No carotid bruit.  Normal pedal pulses.  Abdomen: Soft, nontender, no hepatosplenomegaly, no distention.  Skin: Intact without lesions or rashes.  Neurologic: Alert and oriented x 3.  Psych: Normal affect. Extremities: No clubbing or cyanosis.  HEENT: Normal.   Assessment/Plan: 1. Exertional dyspnea: Patient had TEE 10/17 with EF 45-50% and probably moderate stenosis of the repaired mitral valve (similar to the echo done at [facility] clinic in 12/17).  Discussed mitral stenosis in the past with Dr Roxy Manns.  Stenosis is not severe, probably in the moderate range post-MV repair with mean gradient only 6 mmHg on TEE.  Would hold off on surgical re-intervention for now though he may need in the future.  At last appointment, increased dyspnea with volume overload.  This has improved on Lasix 40 mg daily but still with NYHA class II-III symptoms.    - Continue Lasix 40 mg daily, BMET/BNP today.  - Given ongoing dyspnea and episode of atypical chest pain, will obtain Lexiscan Cardiolite to rule out evidence for significant ischemia.  2. Atrial fibrillation: Paroxysmal.  Had Maze in 11/14.  He was off anticoagulation until a TIA earlier this year and was started on apixaban 2.5 mg bid.  TEE  while on the low dose apixaban showed a layered mural thrombus.  As he was under-dosed on apixaban, it was increased to 5 mg bid. Event monitor in 10/17 did not show atrial fibrillation.  Today, he is in an ectopic atrial rhythm (not fibrillation or flutter).  This is similar to his recent prior ECGs.  - Given valvular atrial fibrillation (now with mitral stenosis), he should ideally be on warfarin, especially given suspected TIA while compliant with Eliquis.  In the past, he has refused warfarin.  However, now he is open to it. He wants to wait 2 more months because he still has Eliquis left.  I will see him back in the office in 6 weeks and we will make the change.  He will have warfarin followed at PCP's office.  3. Cardiomyopathy: Mild, EF 45-50% on recent TEE (10/17).  EF 40-45% on echo at [facility] clinic in 12/17.  Suspect nonischemic cardiomyopathy.  - Coreg was stopped as this appears to have caused significant exercise intolerance.   - He is on losartan 50 mg daily, will continue (need to follow creatinine closely).  BMET today.   4. Mitral valve disorder: History of mitral regurgitation, now s/p minimally invasive MV repair.  He has moderate stenosis of the repaired valve, mean gradient 6 mmHg on 10/17 TEE.  As above, for now will observe without redo operation.   - I will have him get an echo here in 7/18.  5. CKD: Stage III.  Creatinine around 2.5.  He has seen Dr. Justin Mend.  Repeat BMET today.  6. TIA: I suspect Mr Carreira had another TIA in 1/18 with transient left-sided weakness.  TEE in 10/17 showed layered LA thrombus.  He has been on Eliquis 5 mg bid.  He has been compliant on Eliquis.  He has seen neurology.  - Given TIA on Eliquis, we added ASA 81 mg daily.  - At appointment in 6 wks, will transition from Eliquis to warfarin.  7. Hyperlipidemia: Check lipids in 1 month on simvastatin.   Followup in 6 wks.   [name] 10/31/2016

## 2016-11-01 ENCOUNTER — Encounter (HOSPITAL_COMMUNITY): Payer: Medicare HMO

## 2016-11-01 ENCOUNTER — Telehealth (HOSPITAL_COMMUNITY): Payer: Self-pay | Admitting: *Deleted

## 2016-11-01 NOTE — Telephone Encounter (Signed)
Patient given detailed instructions per Myocardial Perfusion Study Information Sheet for the test on 11/03/16 at 0745. Patient notified to arrive 15 minutes early and that it is imperative to arrive on time for appointment to keep from having the test rescheduled.  If you need to cancel or reschedule your appointment, please call the office within 24 hours of your appointment. Failure to do so may result in a cancellation of your appointment, and a $50 no show fee. Patient verbalized understanding.[name], Ranae Palms

## 2016-11-03 ENCOUNTER — Ambulatory Visit (HOSPITAL_COMMUNITY): Payer: Medicare HMO | Attending: Cardiology

## 2016-11-03 DIAGNOSIS — R079 Chest pain, unspecified: Secondary | ICD-10-CM | POA: Insufficient documentation

## 2016-11-03 LAB — MYOCARDIAL PERFUSION IMAGING
CHL CUP NUCLEAR SSS: 13
CSEPPHR: 75 {beats}/min
LV dias vol: 84 mL (ref 62–150)
LV sys vol: 38 mL
RATE: 0.34
Rest HR: 66 {beats}/min
SDS: 3
SRS: 11
TID: 0.84

## 2016-11-03 MED ORDER — REGADENOSON 0.4 MG/5ML IV SOLN
0.4000 mg | Freq: Once | INTRAVENOUS | Status: AC
  Administered 2016-11-03: 0.4 mg via INTRAVENOUS

## 2016-11-03 MED ORDER — TECHNETIUM TC 99M TETROFOSMIN IV KIT
10.4000 | PACK | Freq: Once | INTRAVENOUS | Status: AC | PRN
  Administered 2016-11-03: 10.4 via INTRAVENOUS
  Filled 2016-11-03: qty 11

## 2016-11-03 MED ORDER — TECHNETIUM TC 99M TETROFOSMIN IV KIT
31.7000 | PACK | Freq: Once | INTRAVENOUS | Status: AC | PRN
Start: 2016-11-03 — End: 2016-11-03
  Administered 2016-11-03: 31.7 via INTRAVENOUS
  Filled 2016-11-03: qty 32

## 2016-11-04 ENCOUNTER — Telehealth (HOSPITAL_COMMUNITY): Payer: Self-pay | Admitting: *Deleted

## 2016-11-04 NOTE — Telephone Encounter (Signed)
Medical Clearance request faxed to Dr. Juleen China DDS @ [phone].  Advised to give amoxicillin for antibiotic prophylaxis prior to dental appointment.  May hold aspirin prior procedure but would avoid stopping eliquis due to recent stroke.

## 2016-11-07 ENCOUNTER — Encounter: Payer: Medicare HMO | Admitting: Thoracic Surgery (Cardiothoracic Vascular Surgery)

## 2016-11-07 ENCOUNTER — Telehealth: Payer: Self-pay | Admitting: Cardiology

## 2016-11-07 NOTE — Telephone Encounter (Signed)
Spoke w/Amy and gave her Dr Claris Gladden recommendations:  May hold aspirin prior procedure but would avoid stopping eliquis due to recent stroke.

## 2016-11-07 NOTE — Telephone Encounter (Signed)
Open encounter by mistake

## 2016-11-07 NOTE — Telephone Encounter (Signed)
Amy advised this patient is seen by Dr Aundra Dubin in [facility], [phone].

## 2016-11-07 NOTE — Telephone Encounter (Signed)
Follow Up:    Please call,concerning clearance she received on 10-17-16.

## 2016-11-14 ENCOUNTER — Encounter: Payer: Self-pay | Admitting: Thoracic Surgery (Cardiothoracic Vascular Surgery)

## 2016-11-14 ENCOUNTER — Ambulatory Visit (INDEPENDENT_AMBULATORY_CARE_PROVIDER_SITE_OTHER): Payer: Medicare HMO | Admitting: Thoracic Surgery (Cardiothoracic Vascular Surgery)

## 2016-11-14 VITALS — BP 118/78 | HR 79 | Resp 20 | Ht 74.0 in | Wt 235.0 lb

## 2016-11-14 DIAGNOSIS — Z9889 Other specified postprocedural states: Secondary | ICD-10-CM

## 2016-11-14 DIAGNOSIS — I050 Rheumatic mitral stenosis: Secondary | ICD-10-CM

## 2016-11-14 DIAGNOSIS — Z8679 Personal history of other diseases of the circulatory system: Secondary | ICD-10-CM | POA: Diagnosis not present

## 2016-11-14 NOTE — Patient Instructions (Signed)
Continue all previous medications without any changes at this time  Discuss switching to Coumadin (warfarin) with Dr. Aundra Dubin and Dr. Claudie Leach.  Make every effort to stay physically active, get some type of exercise on a regular basis, and stick to a "heart healthy diet".  The long term benefits for regular exercise and a healthy diet are critically important to your overall health and wellbeing.  Continue to monitor your weight and other signs of fluid overload and discuss management with Dr. Aundra Dubin and Dr. Claudie Leach.

## 2016-11-14 NOTE — Progress Notes (Signed)
Daniel Mayo       [place] [address]             [phone]     CARDIOTHORACIC SURGERY OFFICE NOTE  Referring Provider is Franchot Mimes, MD PCP is Franchot Mimes, MD   HPI:  Patient returns to the office today for follow-up of mild to moderate mitral stenosis status post minimally invasive mitral valve repair and Maze procedure in 2014. He reportedly did fairly well for a couple of years and then he began to experience progressive symptoms of exertional shortness of breath. In May of last year he experienced a TIA. He was restarted on Eliquis at that time but he continued to experience symptoms of exertional shortness of breath.  He was referred to our office last fall and TEE was recommended. Transesophageal echocardiogram was performed by Dr. Aundra Dubin and notable for the presence of layering thrombus within the left atrium despite the fact that the patient was anticoagulated using Eliquis. There was mildly decreased LV systolic function with ejection fraction estimated 45-50%. There was mild to moderate mitral regurgitation and what was felt to be moderate mitral stenosis with mean transvalvular gradient estimated 6 mmHg.  There was mild right ventricular chamber enlargement with mildly elevated pulmonary artery pressures and moderate to severe tricuspid regurgitation. The possibility of switching to warfarin was entertained but the patient requested that he be treated with a higher dose of Eliquis. The patient's dose of Eliquis was increased and medications for congestive heart failure adjusted.   An event monitor performed in October 2017 revealed no atrial fibrillation. He has been followed carefully ever since by Dr. Claudie Leach and Dr. Aundra Dubin.  Follow-up echocardiogram performed at Dr. Verdon Cummins office in late December reportedly demonstrated similar findings with left ventricular ejection fraction estimated 40-45%, mild mitral regurgitation, and moderate mitral  stenosis.  The patient was seen in follow-up recently by Dr. Aundra Dubin and encouraged to consider switching from Eliquis to warfarin given his history of valve related atrial fibrillation.  His medications have been adjusted on several occasions but overall the patient states that he feels better than he did last fall. He still gets short of breath with moderate activity such as walking to the mailbox to get his mail. He has not had resting shortness of breath, PND, orthopnea, or lower extremity edema. He has not had any chest pain or palpitations. He thinks he may have had another brief TIA several weeks ago.   Current Outpatient Prescriptions  Medication Sig Dispense Refill  . apixaban (ELIQUIS) 5 MG TABS tablet Take 1 tablet (5 mg total) by mouth 2 (two) times daily. 60 tablet 3  . aspirin EC 81 MG tablet Take 1 tablet (81 mg total) by mouth daily. 90 tablet 3  . furosemide (LASIX) 40 MG tablet Take 1 tablet (40 mg total) by mouth daily. 30 tablet 3  . losartan (COZAAR) 50 MG tablet Take 1 tablet (50 mg total) by mouth daily.    . nitroGLYCERIN (NITROSTAT) 0.4 MG SL tablet Place 0.4 mg under the tongue every 5 (five) minutes as needed for chest pain.    . simvastatin (ZOCOR) 20 MG tablet Take 1 tablet (20 mg total) by mouth daily. 30 tablet 2   No current facility-administered medications for this visit.       Physical Exam:   BP 118/78   Pulse 79   Resp 20   Ht 6\' 2"  (1.88 m)   Wt 235  lb (106.6 kg)   SpO2 98% Comment: RA  BMI 30.17 kg/m   General:  Well-appearing  Chest:   Clear to auscultation  CV:   Regular rate and rhythm without murmur  Incisions:  n/a  Abdomen:  Soft nontender  Extremities:  Warm and well-perfused  Diagnostic Tests:  Report from transthoracic echocardiogram performed 09/21/2016 at [facility] is reviewed. By report left ventricular ejection fraction was estimated 40-45%. There was mild left ventricular hypertrophy with severe left atrial  enlargement. The mitral valve repair remained intact with "mild" mitral regurgitation" moderate mitral stenosis". Mitral valve area was estimated 1.7 cm using pressure half-time. There was moderate tricuspid regurgitation. Pulmonary artery systemic pressure was estimated 45 mmHg.   Impression:  The patient remains clinically stable with symptoms of exertional shortness of breath and fatigue consistent with chronic combined systolic and diastolic congestive heart failure, New York Heart Association functional class IIB.  He states that he feels better than he did last fall and he has not had any presentations with acute exacerbations of heart failure over the past 6 months. The patient does note that he may have had another brief TIA a few weeks ago, and he apparently has agreed to switch from Eliquis to warfarin for long-term anticoagulation, although this has not yet been done. At present there remain no indication to consider redo mitral valve replacement with or without tricuspid valve repair or replacement.    Plan:  We have not recommended any changes to the patient's current medications, but I do agree that the patient probably would be better served using warfarin anticoagulation. I have discussed the patient's current symptoms as well as findings on the previous transesophageal echocardiogram and the report from the more recent transthoracic echocardiogram performed in [facility]. Indications for redo mitral valve surgery of been discussed as well as the elevated risks associated with surgery. All of his questions been addressed. He will continue to follow-up with Dr. Claudie Leach and Dr. Aundra Dubin.  We will have him return to our office for follow-up in approximately one year to make sure that he is doing acceptably well. He will call and return sooner only should specific problems or questions arise.   I spent in excess of 15 minutes during the conduct of this office consultation and >50% of this  time involved direct face-to-face encounter with the patient for counseling and/or coordination of their care.   Valentina Gu. Roxy Manns, MD 11/14/2016 4:00 PM

## 2016-11-28 ENCOUNTER — Other Ambulatory Visit (HOSPITAL_COMMUNITY): Payer: Self-pay | Admitting: Cardiology

## 2016-11-28 MED ORDER — FUROSEMIDE 40 MG PO TABS: 40.0000 mg | ORAL_TABLET | Freq: Every day | ORAL | 3 refills | Status: DC

## 2016-12-12 ENCOUNTER — Ambulatory Visit (HOSPITAL_COMMUNITY)
Admission: RE | Admit: 2016-12-12 | Discharge: 2016-12-12 | Disposition: A | Discharge status: Home / Self Care | Payer: Medicare HMO | Source: Ambulatory Visit | Attending: Cardiology | Admitting: Cardiology

## 2016-12-12 VITALS — BP 120/72 | HR 72 | Wt 238.5 lb

## 2016-12-12 DIAGNOSIS — Z9889 Other specified postprocedural states: Secondary | ICD-10-CM | POA: Diagnosis not present

## 2016-12-12 DIAGNOSIS — I129 Hypertensive chronic kidney disease with stage 1 through stage 4 chronic kidney disease, or unspecified chronic kidney disease: Secondary | ICD-10-CM | POA: Diagnosis not present

## 2016-12-12 DIAGNOSIS — N189 Chronic kidney disease, unspecified: Secondary | ICD-10-CM | POA: Diagnosis not present

## 2016-12-12 DIAGNOSIS — E784 Other hyperlipidemia: Secondary | ICD-10-CM | POA: Diagnosis not present

## 2016-12-12 DIAGNOSIS — I4891 Unspecified atrial fibrillation: Secondary | ICD-10-CM | POA: Diagnosis not present

## 2016-12-12 DIAGNOSIS — Z79899 Other long term (current) drug therapy: Secondary | ICD-10-CM | POA: Diagnosis not present

## 2016-12-12 DIAGNOSIS — I429 Cardiomyopathy, unspecified: Secondary | ICD-10-CM | POA: Diagnosis not present

## 2016-12-12 DIAGNOSIS — Z7982 Long term (current) use of aspirin: Secondary | ICD-10-CM | POA: Diagnosis not present

## 2016-12-12 DIAGNOSIS — D696 Thrombocytopenia, unspecified: Secondary | ICD-10-CM | POA: Diagnosis not present

## 2016-12-12 DIAGNOSIS — E785 Hyperlipidemia, unspecified: Secondary | ICD-10-CM | POA: Diagnosis not present

## 2016-12-12 DIAGNOSIS — Z8249 Family history of ischemic heart disease and other diseases of the circulatory system: Secondary | ICD-10-CM | POA: Diagnosis not present

## 2016-12-12 DIAGNOSIS — N183 Chronic kidney disease, stage 3 unspecified: Secondary | ICD-10-CM

## 2016-12-12 DIAGNOSIS — K219 Gastro-esophageal reflux disease without esophagitis: Secondary | ICD-10-CM | POA: Diagnosis not present

## 2016-12-12 DIAGNOSIS — Z833 Family history of diabetes mellitus: Secondary | ICD-10-CM | POA: Insufficient documentation

## 2016-12-12 DIAGNOSIS — I480 Paroxysmal atrial fibrillation: Secondary | ICD-10-CM | POA: Insufficient documentation

## 2016-12-12 DIAGNOSIS — I2510 Atherosclerotic heart disease of native coronary artery without angina pectoris: Secondary | ICD-10-CM | POA: Diagnosis not present

## 2016-12-12 DIAGNOSIS — I340 Nonrheumatic mitral (valve) insufficiency: Secondary | ICD-10-CM | POA: Insufficient documentation

## 2016-12-12 DIAGNOSIS — Z5189 Encounter for other specified aftercare: Secondary | ICD-10-CM | POA: Diagnosis not present

## 2016-12-12 DIAGNOSIS — E7849 Other hyperlipidemia: Secondary | ICD-10-CM

## 2016-12-12 DIAGNOSIS — I5022 Chronic systolic (congestive) heart failure: Secondary | ICD-10-CM

## 2016-12-12 DIAGNOSIS — R0600 Dyspnea, unspecified: Secondary | ICD-10-CM | POA: Diagnosis not present

## 2016-12-12 LAB — LIPID PANEL
CHOLESTEROL: 131 mg/dL (ref 0–200)
HDL: 38 mg/dL — AB (ref >40)
LDL CALC: 70 mg/dL (ref 0–99)
TRIGLYCERIDES: 117 mg/dL (ref <150)
Total CHOL/HDL Ratio: 3.4 RATIO
VLDL: 23 mg/dL (ref 0–40)

## 2016-12-12 LAB — BASIC METABOLIC PANEL
ANION GAP: 6 (ref 5–15)
BUN: 26 mg/dL — ABNORMAL HIGH (ref 6–20)
CALCIUM: 9.2 mg/dL (ref 8.9–10.3)
CO2: 27 mmol/L (ref 22–32)
Chloride: 108 mmol/L (ref 101–111)
Creatinine, Ser: 2.54 mg/dL — ABNORMAL HIGH (ref 0.61–1.24)
GFR, EST AFRICAN AMERICAN: 26 mL/min — AB (ref >60)
GFR, EST NON AFRICAN AMERICAN: 22 mL/min — AB (ref >60)
Glucose, Bld: 102 mg/dL — ABNORMAL HIGH (ref 65–99)
Potassium: 4.1 mmol/L (ref 3.5–5.1)
SODIUM: 141 mmol/L (ref 135–145)

## 2016-12-12 NOTE — Progress Notes (Signed)
PCP: Daniel Mayo HF Cardiology: Dr. Aundra Dubin  82 yo with history of paroxysmal atrial fibrillation and severe mitral regurgitation s/p minimally invasive MV repair and Maze in 11/14 presents for cardiology evaluation.  He has been followed at [facility] and I do not have recent records available.  He developed increased exertional dyspnea over the last 6 months.  He was short of breath walking a short distance or doing housework like vacuuming.  He also had a TIA apparently several months ago and was started on apixaban 2.5 mg bid (he had not been anticoagulated since around the time of his prior surgery). He was sent back to Dr Roxy Manns for re-evaluation, there was concern for stenosis of the repaired mitral valve.  He had a TEE in 10/17 showing EF 45-50% range with moderate to moderate-severe tricuspid regurgitation, mildly decreased RV systolic function, and possibly moderate stenosis of the repaired mitral valve (mild to moderate regurgitation).  Most significantly, there was a layered thrombus along the left atrial wall.  I increased his dose of apixaban to 5 mg bid at that time. He was seen in followup by Dr. Roxy Manns, no plan for redo surgery at this time.  Event monitor in 10/17 showed no atrial fibrillation.   After last appointment, Cardiolite was done in 2/18. This showed basal-mid inferolateral fixed defect, but no ischemia.  EF 55%. He has been doing fairly well though he has not been very active over the winter.  No storke-like symptoms.  Breathing has been better since stopping Coreg.  He denies exertional dyspnea while doing his usual ADLs.  No chest pain with exertion.  He did have an episode of about 5-10 minutes of chest heaviness after lying down in bed 2 wks ago.  He has not yet started warfarin as he has not run out of Eliquis yet.   Labs (10/17): K 4.8, creatinine 2.08, BNP 43, hgb 15.6 Labs (12/17): K 4.5, creatinine 2.36, HCT 46.3, plts 105 Labs (1/18): K 4.1, creatinine 2.5, LDL 104,  BNP 175 Labs (2/18): K 4, creatinine 2.65, BNP 65, HCT 44.2  PMH: 1. Mitral regurgitation: s/p minimally invasive mitral valve repair in 11/14 (Dr Roxy Manns).  He has developed stenosis of the repaired valve.  - TEE (10/17) with EF 45-50%, mild diffuse hypokinesis, mildly dilated RV with mildly decreased systolic function, moderate to moderate-severe tricuspid regurgitation, peak RV-RA gradient 31 mmHg, mild AI, mitral valve s/p repair with mild-moderate MR, posterior leaflet relatively fixed with mean gradient 6 mmHg and MVA 1.22 cm^2 => possible moderate mitral stenosis; there was layered thrombus along the wall of the left atrium.  - Echo (12/17, [place] => images not available): Per report, EF 40-45%, mild LVH, severe LAE, s/p MV repair with "moderate mitral stenosis" but MVA 1.7 cm^2 via PHT, mild MR, moderate TR, PASP 45 mmHg.  2. TIA: MRI head 10/17 did not show CVA.  - 1/18 carotid dopplers: No significant stenosis.  3. Atrial fibrillation: Paroxysmal.  S/p Maze procedure with MV repair in 11/14.  - Event monitor (10/17): No atrial fibrillation 4. CKD 5. GERD 6. HTN 7. CAD: LHC (2014) with 70% proximal stenosis in a small high diagonal, otherwise no significant disease.  - Cardiolite (6/17): No ischemia or infarction.  - Cardiolite (2/18): EF 55%, no ischemia, fixed basal to mid inferolateral perfusion defect (low risk).  8. Chronic thrombocytopenia  SH: Lives in [place], not married but has girlfriend, 1-2 glasses wine/night, nonsmoker.   Family History  Problem Relation Age  of Onset  . Diabetes    . Hypertension    . Coronary artery disease     ROS: All systems reviewed and negative except as per HPI.  Current Outpatient Prescriptions  Medication Sig Dispense Refill  . apixaban (ELIQUIS) 5 MG TABS tablet Take 1 tablet (5 mg total) by mouth 2 (two) times daily. 60 tablet 3  . aspirin EC 81 MG tablet Take 1 tablet (81 mg total) by mouth daily. 90 tablet 3  . furosemide (LASIX)  40 MG tablet Take 1 tablet (40 mg total) by mouth daily. 90 tablet 3  . losartan (COZAAR) 50 MG tablet Take 1 tablet (50 mg total) by mouth daily.    . nitroGLYCERIN (NITROSTAT) 0.4 MG SL tablet Place 0.4 mg under the tongue every 5 (five) minutes as needed for chest pain.    . simvastatin (ZOCOR) 20 MG tablet Take 1 tablet (20 mg total) by mouth daily. 30 tablet 2   No current facility-administered medications for this encounter.    BP 120/72   Pulse 72   Wt 238 lb 8 oz (108.2 kg)   SpO2 100%   BMI 30.62 kg/m  General: NAD Neck: JVP not elevated, no thyromegaly or thyroid nodule.  Lungs: Clear to auscultation bilaterally with normal respiratory effort. CV: Nondisplaced PMI.  Heart regular S1/S2, no S3/S4, no murmur.  No edema.  No carotid bruit.  Normal pedal pulses.  Abdomen: Soft, nontender, no hepatosplenomegaly, no distention.  Skin: Intact without lesions or rashes.  Neurologic: Alert and oriented x 3.  Psych: Normal affect. Extremities: No clubbing or cyanosis.  HEENT: Normal.   Assessment/Plan: 1. Exertional dyspnea: Patient had TEE 10/17 with EF 45-50% and possible moderate stenosis of the repaired mitral valve (similar to the echo done at [facility] clinic in 12/17).  Discussed mitral stenosis in the past with Dr Roxy Manns.  Stenosis is not severe, probably in the moderate range post-MV repair with mean gradient only 6 mmHg on TEE.  Would hold off on surgical re-intervention for now though he may need in the future. Dyspnea has been improved on Lasix and off Coreg.  Cardiolite was done in 2/18 with no ischemia.     - Continue Lasix 40 mg daily, BMET today.  2. Atrial fibrillation: Paroxysmal.  Had Maze in 11/14.  He was off anticoagulation until a TIA earlier this year and was started on apixaban 2.5 mg bid.  TEE while on the low dose apixaban showed a layered mural thrombus.  As he was under-dosed on apixaban, it was increased to 5 mg bid. Event monitor in 10/17 did not show atrial  fibrillation.  Recent rhythm has been an ectopic atrial rhythm.  HR is regular today.  - Given valvular atrial fibrillation (now with mitral stenosis), he should ideally be on warfarin, especially given suspected TIA while compliant with Eliquis.  In the past, he has refused warfarin.  However, now he is open to it but wants to finish all the Eliquis he has first.  He has about 4 more weeks of Eliquis. When he finishes this, will start him on warfarin.  INR will be followed by PCP.   3. Cardiomyopathy: Mild, EF 45-50% on recent TEE (10/17).  EF 40-45% on echo at [facility] clinic in 12/17.  Cardiolite (2/18) with EF 55%.  Suspect nonischemic cardiomyopathy.  - Coreg was stopped as this appears to have caused significant exercise intolerance.   - He is on losartan 50 mg daily, will continue (need to  follow creatinine closely).  BMET today.   4. Mitral valve disorder: History of mitral regurgitation, now s/p minimally invasive MV repair.  He has possible moderate stenosis of the repaired valve, mean gradient 6 mmHg on 10/17 TEE.  As above, for now will observe without redo operation.   - I will have him get an echo here in 7/18.  5. CKD: Stage III.  Creatinine around 2.5.  He has seen Dr. Justin Mend.  Repeat BMET today.  6. TIA: I suspect Mr Geraci had another TIA in 1/18 with transient left-sided weakness.  TEE in 10/17 showed layered LA thrombus.  He has been on Eliquis 5 mg bid.  He has been compliant on Eliquis.  He has seen neurology.  - Given TIA on Eliquis, we added ASA 81 mg daily.  - In about 4 wks (as above), he will transition from Eliquis to warfarin.   7. Hyperlipidemia: Check lipids today.    Followup in 7/18 with echo + office visit.   [name] 12/12/2016

## 2016-12-12 NOTE — Patient Instructions (Signed)
Labs today  We will contact you in 4 months to schedule your next appointment and echocardiogram  The last day you take Eliquis start taking your Coumadin.  Please contact your Primary Care MD to let them know when you start the Coumadin so they can schedule your for a coumadin check.

## 2016-12-20 ENCOUNTER — Other Ambulatory Visit: Payer: Self-pay | Admitting: Neurology

## 2016-12-20 MED ORDER — SIMVASTATIN 20 MG PO TABS: 20.0000 mg | ORAL_TABLET | Freq: Every day | ORAL | 0 refills | Status: DC

## 2017-01-12 ENCOUNTER — Other Ambulatory Visit (HOSPITAL_COMMUNITY): Payer: Self-pay | Admitting: Cardiology

## 2017-01-12 MED ORDER — FUROSEMIDE 40 MG PO TABS: 40.0000 mg | ORAL_TABLET | Freq: Every day | ORAL | 3 refills | Status: DC

## 2017-02-01 ENCOUNTER — Other Ambulatory Visit: Payer: Medicare HMO

## 2017-02-08 ENCOUNTER — Ambulatory Visit: Payer: Medicare HMO | Admitting: Neurology

## 2017-03-08 ENCOUNTER — Other Ambulatory Visit: Payer: Self-pay | Admitting: Nephrology

## 2017-03-08 DIAGNOSIS — N2581 Secondary hyperparathyroidism of renal origin: Secondary | ICD-10-CM

## 2017-03-08 DIAGNOSIS — N183 Chronic kidney disease, stage 3 unspecified: Secondary | ICD-10-CM

## 2017-03-09 ENCOUNTER — Other Ambulatory Visit: Payer: Medicare HMO

## 2017-03-13 ENCOUNTER — Other Ambulatory Visit: Payer: Medicare HMO

## 2017-03-13 ENCOUNTER — Ambulatory Visit
Admission: RE | Admit: 2017-03-13 | Discharge: 2017-03-13 | Disposition: A | Discharge status: Home / Self Care | Payer: Medicare HMO | Source: Ambulatory Visit | Attending: Nephrology | Admitting: Nephrology

## 2017-03-13 DIAGNOSIS — N2581 Secondary hyperparathyroidism of renal origin: Secondary | ICD-10-CM

## 2017-03-13 DIAGNOSIS — N183 Chronic kidney disease, stage 3 unspecified: Secondary | ICD-10-CM

## 2017-03-26 ENCOUNTER — Other Ambulatory Visit: Payer: Self-pay | Admitting: Neurology

## 2017-04-27 ENCOUNTER — Other Ambulatory Visit (HOSPITAL_COMMUNITY): Payer: Self-pay | Admitting: Cardiology

## 2017-07-07 ENCOUNTER — Ambulatory Visit (HOSPITAL_BASED_OUTPATIENT_CLINIC_OR_DEPARTMENT_OTHER)
Admission: RE | Admit: 2017-07-07 | Discharge: 2017-07-07 | Disposition: A | Discharge status: Home / Self Care | Payer: Medicare HMO | Source: Ambulatory Visit | Attending: Cardiology | Admitting: Cardiology

## 2017-07-07 ENCOUNTER — Encounter (HOSPITAL_COMMUNITY): Payer: Self-pay | Admitting: Cardiology

## 2017-07-07 ENCOUNTER — Ambulatory Visit (HOSPITAL_COMMUNITY)
Admission: RE | Admit: 2017-07-07 | Discharge: 2017-07-07 | Disposition: A | Discharge status: Home / Self Care | Payer: Medicare HMO | Source: Ambulatory Visit | Attending: Cardiology | Admitting: Cardiology

## 2017-07-07 VITALS — BP 138/74 | HR 84 | Wt 232.8 lb

## 2017-07-07 DIAGNOSIS — I482 Chronic atrial fibrillation, unspecified: Secondary | ICD-10-CM

## 2017-07-07 DIAGNOSIS — I429 Cardiomyopathy, unspecified: Secondary | ICD-10-CM | POA: Diagnosis not present

## 2017-07-07 DIAGNOSIS — I4891 Unspecified atrial fibrillation: Secondary | ICD-10-CM

## 2017-07-07 DIAGNOSIS — N183 Chronic kidney disease, stage 3 unspecified: Secondary | ICD-10-CM

## 2017-07-07 DIAGNOSIS — I5032 Chronic diastolic (congestive) heart failure: Secondary | ICD-10-CM

## 2017-07-07 LAB — BASIC METABOLIC PANEL
Anion gap: 10 (ref 5–15)
BUN: 25 mg/dL — AB (ref 6–20)
CO2: 23 mmol/L (ref 22–32)
CREATININE: 2.25 mg/dL — AB (ref 0.61–1.24)
Calcium: 9.1 mg/dL (ref 8.9–10.3)
Chloride: 106 mmol/L (ref 101–111)
GFR calc Af Amer: 30 mL/min — ABNORMAL LOW (ref >60)
GFR, EST NON AFRICAN AMERICAN: 26 mL/min — AB (ref >60)
GLUCOSE: 84 mg/dL (ref 65–99)
POTASSIUM: 4 mmol/L (ref 3.5–5.1)
Sodium: 139 mmol/L (ref 135–145)

## 2017-07-07 LAB — BRAIN NATRIURETIC PEPTIDE: B Natriuretic Peptide: 143.8 pg/mL — ABNORMAL HIGH (ref 0.0–100.0)

## 2017-07-07 MED ORDER — FUROSEMIDE 40 MG PO TABS: ORAL_TABLET | ORAL | 3 refills | Status: DC

## 2017-07-07 NOTE — Progress Notes (Signed)
PCP: Lennice Sites [facility] Cardiology: Dr. Aundra Dubin  82 yo with history of paroxysmal atrial fibrillation and severe mitral regurgitation s/p minimally invasive MV repair and Maze in 11/14 returns for followup of valvular disease and dyspnea.  He developed increased exertional dyspnea over the 6 months prior to the initial evaluation in this office.  He was short of breath walking a short distance or doing housework like vacuuming.  He also had had a TIA  and was started on apixaban 2.5 mg bid (he had not been anticoagulated since around the time of his prior surgery). He was sent back to Dr Roxy Manns for re-evaluation, there was concern for stenosis of the repaired mitral valve.  He had a TEE in 10/17 showing EF 45-50% range with moderate to moderate-severe tricuspid regurgitation, mildly decreased RV systolic function, and possibly moderate stenosis of the repaired mitral valve (mild to moderate regurgitation).  Most significantly, there was a layered thrombus along the left atrial wall.  I increased his dose of apixaban to 5 mg bid at that time. He was seen in followup by Dr. Roxy Manns, no plan for redo surgery at this time.  Event monitor in 10/17 showed no atrial fibrillation.  Cardiolite was done in 2/18. This showed basal-mid inferolateral fixed defect, but no ischemia, EF 55%. He was transitioned from Eliquis to warfarin given valvular disease.    Echo was done today, showing EF 55% with mildly decreased RV systolic function; s/p MV repair with mild stenosis and trivial MR.    He reports somewhat fluctuating dyspnea.  Sometimes he can walk 200 yards without stopping, sometimes only 100 yards.  He has generalized fatigue.  No chest pain.  No orthopnea/PND.  He has stable renal dysfunction.  For the last 6 months, he does not seem to have been as active as usual due to low back pain.    ECG (personally reviewed): NSR, iRBBB, PVCs  REDS vest reading 39%  Labs (10/17): K 4.8, creatinine 2.08, BNP 43, hgb 15.6 Labs (12/17):  K 4.5, creatinine 2.36, HCT 46.3, plts 105 Labs (1/18): K 4.1, creatinine 2.5, LDL 104, BNP 175 Labs (2/18): K 4, creatinine 2.65, BNP 65, HCT 44.2 Labs (3/18): LDL 70, HDL 38 Labs (7/18): K 4.4, creatinine 2.66  PMH: 1. Mitral regurgitation: s/p minimally invasive mitral valve repair in 11/14 (Dr Roxy Manns).  He has developed stenosis of the repaired valve.  - TEE (10/17) with EF 45-50%, mild diffuse hypokinesis, mildly dilated RV with mildly decreased systolic function, moderate to moderate-severe tricuspid regurgitation, peak RV-RA gradient 31 mmHg, mild AI, mitral valve s/p repair with mild-moderate MR, posterior leaflet relatively fixed with mean gradient 6 mmHg and MVA 1.22 cm^2 => possible moderate mitral stenosis; there was layered thrombus along the wall of the left atrium.  - Echo (12/17, [place] => images not available): Per report, EF 40-45%, mild LVH, severe LAE, s/p MV repair with "moderate mitral stenosis" but MVA 1.7 cm^2 via PHT, mild MR, moderate TR, PASP 45 mmHg.  - Echo (10/18): EF 55%, mild RV dilation with mildly decreased systolic function, s/p MV repair with mild mitral stenosis (mean gradient 5 mmHg and MVA 1.5 cm^2), trivial MR, trivial TR.  2. TIA: MRI head 10/17 did not show CVA.  - 1/18 carotid dopplers: No significant stenosis.  3. Atrial fibrillation: Paroxysmal.  S/p Maze procedure with MV repair in 11/14.  - Event monitor (10/17): No atrial fibrillation 4. CKD 5. GERD 6. HTN 7. CAD: LHC (2014) with 70% proximal stenosis in  a small high diagonal, otherwise no significant disease.  - Cardiolite (6/17): No ischemia or infarction.  - Cardiolite (2/18): EF 55%, no ischemia, fixed basal to mid inferolateral perfusion defect (low risk).  8. Chronic thrombocytopenia  SH: Lives in [place], not married but has girlfriend, 1-2 glasses wine/night, nonsmoker.   Family History  Problem Relation Age of Onset  . Diabetes Unknown   . Hypertension Unknown   . Coronary artery  disease Unknown    ROS: All systems reviewed and negative except as per HPI.  Current Outpatient Prescriptions  Medication Sig Dispense Refill  . aspirin EC 81 MG tablet Take 1 tablet (81 mg total) by mouth daily. 90 tablet 3  . furosemide (LASIX) 40 MG tablet Take 40 mg (1 Tablet) in the AM and 20 mg (0.5 Tablet) in the PM. 135 tablet 3  . losartan (COZAAR) 50 MG tablet Take 1 tablet (50 mg total) by mouth daily.    . nitroGLYCERIN (NITROSTAT) 0.4 MG SL tablet Place 0.4 mg under the tongue every 5 (five) minutes as needed for chest pain.    . simvastatin (ZOCOR) 20 MG tablet TAKE 1 TABLET BY MOUTH EVERY DAY 90 tablet 0  . warfarin (COUMADIN) 5 MG tablet Take 5 mg by mouth daily. Take as directed per coumadin clinic     No current facility-administered medications for this encounter.    BP 138/74 (BP Location: Left Arm, Patient Position: Sitting, Cuff Size: Normal)   Pulse 84   Wt 232 lb 12.8 oz (105.6 kg)   SpO2 97%   BMI 29.89 kg/m  General: NAD Neck: Thick, JVP difficult (?8 cm), no thyromegaly or thyroid nodule.  Lungs: Clear to auscultation bilaterally with normal respiratory effort. CV: Nondisplaced PMI.  Heart regular S1/S2, no S3/S4, 2/6 SEM RUSB.  No peripheral edema.  No carotid bruit.  Normal pedal pulses.  Abdomen: Soft, nontender, no hepatosplenomegaly, no distention.  Skin: Intact without lesions or rashes.  Neurologic: Alert and oriented x 3.  Psych: Normal affect. Extremities: No clubbing or cyanosis.  HEENT: Normal.   Assessment/Plan: 1. Exertional dyspnea/chronic diastolic CHF: Patient had TEE 10/17 with EF 45-50% and possible moderate stenosis of the repaired mitral valve (similar to the echo done at [facility] in 12/17).  Discussed mitral stenosis in the past with Dr Roxy Manns.  Stenosis is not severe.  Would hold off on surgical re-intervention for now though he may need in the future. Dyspnea improved on Lasix and off Coreg.  Cardiolite was done in 2/18 with no  ischemia.  Echo was done today, EF 55%, mildly decreased RV systolic function, and probably only mild mitral stenosis.  He is a bit more short of breath currently.  Volume status is difficult by exam but REDS vest suggests mild volume overload (39%).    - Increase Lasix to 40 qam/20 qpm.  BMET/BNP today and repeat in 10 days.  2. Atrial fibrillation: Paroxysmal.  Had Maze in 11/14.  He was off anticoagulation until a TIA and was started on apixaban 2.5 mg bid.  TEE while on the low dose apixaban showed a layered mural thrombus.  As he was under-dosed on apixaban, it was increased to 5 mg bid. Event monitor in 10/17 did not show atrial fibrillation.  Given valvular atrial fibrillation, he was switched over to warfarin, which he continues.    3. Cardiomyopathy: Mild, EF 45-50% on recent TEE (10/17).  EF 40-45% on echo at [facility] in 12/17.  Cardiolite (2/18) with  EF 55%.  Echo 10/18 with EF up to 55%.  - Coreg was stopped as this appears to have caused significant exercise intolerance.   - He is on losartan 50 mg daily, will continue (need to follow creatinine closely).  BMET today.   4. Mitral valve disorder: History of mitral regurgitation, now s/p minimally invasive MV repair.  He has probably mild stenosis of the repaired valve, mean gradient 5 mmHg on 10/18 echo.  As above, for now will observe without redo operation.   5. CKD: Stage III.  He has seen Dr. Justin Mend.  Repeat BMET today.  6. TIA: I suspect Mr Menges had another TIA in 1/18 with transient left-sided weakness.  TEE in 10/17 showed layered LA thrombus.  He had been on Eliquis 5 mg bid => transitioned to warfarin with valvular afib.  He has seen neurology.  - Given TIA on Eliquis, we added ASA 81 mg daily.  - He will continue warfarin.   7. Hyperlipidemia: Good lipids in 3/18.     I would like to see him more active, encouraged him to go back [facility].   Followup in 3 months.   [name] 07/07/2017

## 2017-07-07 NOTE — Patient Instructions (Addendum)
Labs today (will call for abnormal results, otherwise no news is good news)  INCREASE lasix to 40 mg (1 Tablet) in the AM and 20 mg (0.5 Tablet) in the PM.   Labs in 10 days (bmet)  Follow up in 3 Months, please call our office in December to schedule your follow up appointment. [phone] OPTION 3

## 2017-07-07 NOTE — Progress Notes (Signed)
  Echocardiogram 2D Echocardiogram has been performed.  Daniel Mayo 07/07/2017, 9:58 AM

## 2017-07-18 ENCOUNTER — Ambulatory Visit (HOSPITAL_COMMUNITY)
Admission: RE | Admit: 2017-07-18 | Discharge: 2017-07-18 | Disposition: A | Discharge status: Home / Self Care | Payer: Medicare HMO | Source: Ambulatory Visit | Attending: Cardiology | Admitting: Cardiology

## 2017-07-18 DIAGNOSIS — I482 Chronic atrial fibrillation, unspecified: Secondary | ICD-10-CM

## 2017-07-18 LAB — BASIC METABOLIC PANEL
ANION GAP: 9 (ref 5–15)
BUN: 36 mg/dL — AB (ref 6–20)
CHLORIDE: 104 mmol/L (ref 101–111)
CO2: 27 mmol/L (ref 22–32)
Calcium: 9.3 mg/dL (ref 8.9–10.3)
Creatinine, Ser: 2.88 mg/dL — ABNORMAL HIGH (ref 0.61–1.24)
GFR calc Af Amer: 22 mL/min — ABNORMAL LOW (ref >60)
GFR calc non Af Amer: 19 mL/min — ABNORMAL LOW (ref >60)
GLUCOSE: 130 mg/dL — AB (ref 65–99)
POTASSIUM: 3.8 mmol/L (ref 3.5–5.1)
Sodium: 140 mmol/L (ref 135–145)

## 2017-07-25 ENCOUNTER — Encounter (HOSPITAL_COMMUNITY): Payer: Self-pay | Admitting: *Deleted

## 2017-08-03 ENCOUNTER — Telehealth (HOSPITAL_COMMUNITY): Payer: Self-pay | Admitting: *Deleted

## 2017-08-03 DIAGNOSIS — I5022 Chronic systolic (congestive) heart failure: Secondary | ICD-10-CM

## 2017-08-03 NOTE — Telephone Encounter (Signed)
Pt called back lab results reviewed.

## 2017-08-14 ENCOUNTER — Ambulatory Visit (HOSPITAL_COMMUNITY)
Admission: RE | Admit: 2017-08-14 | Discharge: 2017-08-14 | Disposition: A | Discharge status: Home / Self Care | Payer: Medicare HMO | Source: Ambulatory Visit | Attending: Cardiology | Admitting: Cardiology

## 2017-08-14 DIAGNOSIS — I5022 Chronic systolic (congestive) heart failure: Secondary | ICD-10-CM | POA: Insufficient documentation

## 2017-08-14 LAB — BASIC METABOLIC PANEL
ANION GAP: 7 (ref 5–15)
BUN: 30 mg/dL — ABNORMAL HIGH (ref 6–20)
CHLORIDE: 104 mmol/L (ref 101–111)
CO2: 30 mmol/L (ref 22–32)
Calcium: 9.5 mg/dL (ref 8.9–10.3)
Creatinine, Ser: 2.83 mg/dL — ABNORMAL HIGH (ref 0.61–1.24)
GFR calc non Af Amer: 20 mL/min — ABNORMAL LOW (ref >60)
GFR, EST AFRICAN AMERICAN: 23 mL/min — AB (ref >60)
GLUCOSE: 111 mg/dL — AB (ref 65–99)
Potassium: 4 mmol/L (ref 3.5–5.1)
Sodium: 141 mmol/L (ref 135–145)

## 2017-10-06 ENCOUNTER — Emergency Department (HOSPITAL_BASED_OUTPATIENT_CLINIC_OR_DEPARTMENT_OTHER): Payer: Medicare HMO

## 2017-10-06 ENCOUNTER — Emergency Department (HOSPITAL_BASED_OUTPATIENT_CLINIC_OR_DEPARTMENT_OTHER)
Admission: EM | Admit: 2017-10-06 | Discharge: 2017-10-06 | Disposition: A | Discharge status: Home / Self Care | Payer: Medicare HMO | Attending: Emergency Medicine | Admitting: Emergency Medicine

## 2017-10-06 ENCOUNTER — Other Ambulatory Visit: Payer: Self-pay

## 2017-10-06 ENCOUNTER — Encounter (HOSPITAL_BASED_OUTPATIENT_CLINIC_OR_DEPARTMENT_OTHER): Payer: Self-pay | Admitting: *Deleted

## 2017-10-06 DIAGNOSIS — M545 Low back pain, unspecified: Secondary | ICD-10-CM

## 2017-10-06 DIAGNOSIS — Z79899 Other long term (current) drug therapy: Secondary | ICD-10-CM | POA: Diagnosis not present

## 2017-10-06 DIAGNOSIS — S161XXA Strain of muscle, fascia and tendon at neck level, initial encounter: Secondary | ICD-10-CM | POA: Insufficient documentation

## 2017-10-06 DIAGNOSIS — Y92410 Unspecified street and highway as the place of occurrence of the external cause: Secondary | ICD-10-CM | POA: Diagnosis not present

## 2017-10-06 DIAGNOSIS — Y998 Other external cause status: Secondary | ICD-10-CM | POA: Insufficient documentation

## 2017-10-06 DIAGNOSIS — S199XXA Unspecified injury of neck, initial encounter: Secondary | ICD-10-CM | POA: Diagnosis present

## 2017-10-06 DIAGNOSIS — N189 Chronic kidney disease, unspecified: Secondary | ICD-10-CM | POA: Diagnosis not present

## 2017-10-06 DIAGNOSIS — Y9389 Activity, other specified: Secondary | ICD-10-CM | POA: Diagnosis not present

## 2017-10-06 DIAGNOSIS — I129 Hypertensive chronic kidney disease with stage 1 through stage 4 chronic kidney disease, or unspecified chronic kidney disease: Secondary | ICD-10-CM | POA: Diagnosis not present

## 2017-10-06 DIAGNOSIS — Z7982 Long term (current) use of aspirin: Secondary | ICD-10-CM | POA: Diagnosis not present

## 2017-10-06 DIAGNOSIS — I2510 Atherosclerotic heart disease of native coronary artery without angina pectoris: Secondary | ICD-10-CM | POA: Insufficient documentation

## 2017-10-06 MED ORDER — HYDROCODONE-ACETAMINOPHEN 5-325 MG PO TABS: 2.0000 | ORAL_TABLET | ORAL | 0 refills | Status: DC | PRN

## 2017-10-06 MED FILL — HYDROCODON-APAP 5-325: 5-325 | 1 days supply | Qty: 6 | Fill #0
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
## 2017-10-06 NOTE — Discharge Instructions (Signed)
See your Physician for recheck next week if pain persist.   Tylenol or ibuprofen for soreness.  Return if any problems.
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
## 2017-10-24 ENCOUNTER — Encounter (HOSPITAL_COMMUNITY): Payer: Self-pay | Admitting: Cardiology

## 2017-10-24 ENCOUNTER — Other Ambulatory Visit: Payer: Self-pay

## 2017-10-24 ENCOUNTER — Ambulatory Visit (HOSPITAL_COMMUNITY)
Admission: RE | Admit: 2017-10-24 | Discharge: 2017-10-24 | Disposition: A | Discharge status: Home / Self Care | Payer: Medicare HMO | Source: Ambulatory Visit | Attending: Cardiology | Admitting: Cardiology

## 2017-10-24 VITALS — BP 119/63 | HR 80 | Wt 239.5 lb

## 2017-10-24 DIAGNOSIS — Z79899 Other long term (current) drug therapy: Secondary | ICD-10-CM | POA: Diagnosis not present

## 2017-10-24 DIAGNOSIS — I130 Hypertensive heart and chronic kidney disease with heart failure and stage 1 through stage 4 chronic kidney disease, or unspecified chronic kidney disease: Secondary | ICD-10-CM | POA: Diagnosis not present

## 2017-10-24 DIAGNOSIS — I4891 Unspecified atrial fibrillation: Secondary | ICD-10-CM

## 2017-10-24 DIAGNOSIS — Z8249 Family history of ischemic heart disease and other diseases of the circulatory system: Secondary | ICD-10-CM | POA: Diagnosis not present

## 2017-10-24 DIAGNOSIS — N183 Chronic kidney disease, stage 3 (moderate): Secondary | ICD-10-CM | POA: Insufficient documentation

## 2017-10-24 DIAGNOSIS — Z7902 Long term (current) use of antithrombotics/antiplatelets: Secondary | ICD-10-CM | POA: Diagnosis not present

## 2017-10-24 DIAGNOSIS — I2510 Atherosclerotic heart disease of native coronary artery without angina pectoris: Secondary | ICD-10-CM | POA: Insufficient documentation

## 2017-10-24 DIAGNOSIS — I480 Paroxysmal atrial fibrillation: Secondary | ICD-10-CM

## 2017-10-24 DIAGNOSIS — Z9889 Other specified postprocedural states: Secondary | ICD-10-CM | POA: Diagnosis not present

## 2017-10-24 DIAGNOSIS — Z8673 Personal history of transient ischemic attack (TIA), and cerebral infarction without residual deficits: Secondary | ICD-10-CM | POA: Diagnosis not present

## 2017-10-24 DIAGNOSIS — Z7982 Long term (current) use of aspirin: Secondary | ICD-10-CM | POA: Diagnosis not present

## 2017-10-24 DIAGNOSIS — D696 Thrombocytopenia, unspecified: Secondary | ICD-10-CM | POA: Diagnosis not present

## 2017-10-24 DIAGNOSIS — E785 Hyperlipidemia, unspecified: Secondary | ICD-10-CM | POA: Diagnosis not present

## 2017-10-24 DIAGNOSIS — K219 Gastro-esophageal reflux disease without esophagitis: Secondary | ICD-10-CM | POA: Diagnosis not present

## 2017-10-24 DIAGNOSIS — I059 Rheumatic mitral valve disease, unspecified: Secondary | ICD-10-CM | POA: Insufficient documentation

## 2017-10-24 DIAGNOSIS — Z833 Family history of diabetes mellitus: Secondary | ICD-10-CM | POA: Diagnosis not present

## 2017-10-24 DIAGNOSIS — I5032 Chronic diastolic (congestive) heart failure: Secondary | ICD-10-CM

## 2017-10-24 LAB — BASIC METABOLIC PANEL
ANION GAP: 11 (ref 5–15)
BUN: 26 mg/dL — ABNORMAL HIGH (ref 6–20)
CHLORIDE: 105 mmol/L (ref 101–111)
CO2: 27 mmol/L (ref 22–32)
Calcium: 9.6 mg/dL (ref 8.9–10.3)
Creatinine, Ser: 2.49 mg/dL — ABNORMAL HIGH (ref 0.61–1.24)
GFR calc non Af Amer: 23 mL/min — ABNORMAL LOW (ref >60)
GFR, EST AFRICAN AMERICAN: 27 mL/min — AB (ref >60)
Glucose, Bld: 139 mg/dL — ABNORMAL HIGH (ref 65–99)
Potassium: 5 mmol/L (ref 3.5–5.1)
Sodium: 143 mmol/L (ref 135–145)

## 2017-10-24 LAB — LIPID PANEL
CHOLESTEROL: 113 mg/dL (ref 0–200)
HDL: 42 mg/dL (ref >40)
LDL Cholesterol: 53 mg/dL (ref 0–99)
Total CHOL/HDL Ratio: 2.7 RATIO
Triglycerides: 89 mg/dL (ref <150)
VLDL: 18 mg/dL (ref 0–40)

## 2017-10-24 NOTE — Patient Instructions (Signed)
Labs drawn today (if we do not call you, then your lab work was stable)   Your physician recommends that you schedule a follow-up appointment in: 6 months with Dr. McLean  (we will call you)    
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
## 2017-10-25 ENCOUNTER — Encounter (HOSPITAL_COMMUNITY): Payer: Self-pay

## 2017-11-13 ENCOUNTER — Encounter: Payer: Medicare HMO | Admitting: Thoracic Surgery (Cardiothoracic Vascular Surgery)

## 2017-11-20 ENCOUNTER — Ambulatory Visit: Payer: Medicare HMO | Admitting: Thoracic Surgery (Cardiothoracic Vascular Surgery)

## 2017-11-20 ENCOUNTER — Encounter: Payer: Self-pay | Admitting: Thoracic Surgery (Cardiothoracic Vascular Surgery)

## 2017-11-20 VITALS — BP 122/73 | HR 60 | Resp 20 | Ht 72.0 in | Wt 235.0 lb

## 2017-11-20 DIAGNOSIS — Z9889 Other specified postprocedural states: Secondary | ICD-10-CM

## 2017-11-20 DIAGNOSIS — Z8679 Personal history of other diseases of the circulatory system: Secondary | ICD-10-CM | POA: Diagnosis not present

## 2017-11-20 NOTE — Patient Instructions (Signed)
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
## 2018-03-28 ENCOUNTER — Other Ambulatory Visit (HOSPITAL_COMMUNITY): Payer: Self-pay | Admitting: Internal Medicine

## 2018-07-09 ENCOUNTER — Other Ambulatory Visit: Payer: Self-pay

## 2018-07-09 DIAGNOSIS — N185 Chronic kidney disease, stage 5: Secondary | ICD-10-CM

## 2018-07-31 ENCOUNTER — Ambulatory Visit (HOSPITAL_COMMUNITY)
Admission: RE | Admit: 2018-07-31 | Discharge: 2018-07-31 | Disposition: A | Discharge status: Home / Self Care | Payer: Medicare HMO | Source: Ambulatory Visit | Attending: Cardiology | Admitting: Cardiology

## 2018-07-31 ENCOUNTER — Encounter (HOSPITAL_COMMUNITY): Payer: Self-pay | Admitting: Cardiology

## 2018-07-31 VITALS — BP 138/82 | HR 75 | Wt 240.2 lb

## 2018-07-31 DIAGNOSIS — I2510 Atherosclerotic heart disease of native coronary artery without angina pectoris: Secondary | ICD-10-CM | POA: Insufficient documentation

## 2018-07-31 DIAGNOSIS — I5022 Chronic systolic (congestive) heart failure: Secondary | ICD-10-CM

## 2018-07-31 DIAGNOSIS — I5032 Chronic diastolic (congestive) heart failure: Secondary | ICD-10-CM | POA: Diagnosis not present

## 2018-07-31 DIAGNOSIS — Z79899 Other long term (current) drug therapy: Secondary | ICD-10-CM | POA: Diagnosis not present

## 2018-07-31 DIAGNOSIS — E785 Hyperlipidemia, unspecified: Secondary | ICD-10-CM | POA: Diagnosis not present

## 2018-07-31 DIAGNOSIS — Z8673 Personal history of transient ischemic attack (TIA), and cerebral infarction without residual deficits: Secondary | ICD-10-CM | POA: Diagnosis not present

## 2018-07-31 DIAGNOSIS — N183 Chronic kidney disease, stage 3 unspecified: Secondary | ICD-10-CM

## 2018-07-31 DIAGNOSIS — I4510 Unspecified right bundle-branch block: Secondary | ICD-10-CM | POA: Insufficient documentation

## 2018-07-31 DIAGNOSIS — Z9889 Other specified postprocedural states: Secondary | ICD-10-CM | POA: Insufficient documentation

## 2018-07-31 DIAGNOSIS — K219 Gastro-esophageal reflux disease without esophagitis: Secondary | ICD-10-CM | POA: Diagnosis not present

## 2018-07-31 DIAGNOSIS — I130 Hypertensive heart and chronic kidney disease with heart failure and stage 1 through stage 4 chronic kidney disease, or unspecified chronic kidney disease: Secondary | ICD-10-CM | POA: Insufficient documentation

## 2018-07-31 DIAGNOSIS — D696 Thrombocytopenia, unspecified: Secondary | ICD-10-CM | POA: Diagnosis not present

## 2018-07-31 DIAGNOSIS — I340 Nonrheumatic mitral (valve) insufficiency: Secondary | ICD-10-CM | POA: Insufficient documentation

## 2018-07-31 DIAGNOSIS — I493 Ventricular premature depolarization: Secondary | ICD-10-CM | POA: Diagnosis not present

## 2018-07-31 DIAGNOSIS — I480 Paroxysmal atrial fibrillation: Secondary | ICD-10-CM | POA: Insufficient documentation

## 2018-07-31 DIAGNOSIS — Z7901 Long term (current) use of anticoagulants: Secondary | ICD-10-CM | POA: Diagnosis not present

## 2018-07-31 LAB — BASIC METABOLIC PANEL
Anion gap: 9 (ref 5–15)
BUN: 26 mg/dL — AB (ref 8–23)
CHLORIDE: 105 mmol/L (ref 98–111)
CO2: 27 mmol/L (ref 22–32)
CREATININE: 2.6 mg/dL — AB (ref 0.61–1.24)
Calcium: 9.3 mg/dL (ref 8.9–10.3)
GFR calc Af Amer: 25 mL/min — ABNORMAL LOW (ref >60)
GFR calc non Af Amer: 22 mL/min — ABNORMAL LOW (ref >60)
GLUCOSE: 114 mg/dL — AB (ref 70–99)
POTASSIUM: 3.5 mmol/L (ref 3.5–5.1)
Sodium: 141 mmol/L (ref 135–145)

## 2018-07-31 LAB — LIPID PANEL
CHOL/HDL RATIO: 2.9 ratio
Cholesterol: 120 mg/dL (ref 0–200)
HDL: 41 mg/dL (ref >40)
LDL CALC: 49 mg/dL (ref 0–99)
TRIGLYCERIDES: 150 mg/dL — AB (ref <150)
VLDL: 30 mg/dL (ref 0–40)

## 2018-07-31 NOTE — Patient Instructions (Signed)
Labs done today  Your physician has requested that you have an echocardiogram. Echocardiography is a painless test that uses sound waves to create images of your heart. It provides your doctor with information about the size and shape of your heart and how well your heart's chambers and valves are working. This procedure takes approximately one hour. There are no restrictions for this procedure. [facility] will call you to schedule your appointment.  Follow up with Dr. Aundra Dubin in 6 months

## 2018-07-31 NOTE — Progress Notes (Signed)
PCP: Lennice Sites HF Cardiology: Dr. Aundra Dubin  82 y.o. with history of paroxysmal atrial fibrillation and severe mitral regurgitation s/p minimally invasive MV repair and Maze in 11/14 returns for followup of valvular disease and dyspnea.  He developed increased exertional dyspnea over the 6 months prior to the initial evaluation in this office.  He was short of breath walking a short distance or doing housework like vacuuming.  He also had had a TIA  and was started on apixaban 2.5 mg bid (he had not been anticoagulated since around the time of his prior surgery). He was sent back to Dr Roxy Manns for re-evaluation, there was concern for stenosis of the repaired mitral valve.  He had a TEE in 10/17 showing EF 45-50% range with moderate to moderate-severe tricuspid regurgitation, mildly decreased RV systolic function, and possibly moderate stenosis of the repaired mitral valve (mild to moderate regurgitation).  Most significantly, there was a layered thrombus along the left atrial wall.  I increased his dose of apixaban to 5 mg bid at that time. He was seen in followup by Dr. Roxy Manns, no plan for redo surgery at this time.  Event monitor in 10/17 showed no atrial fibrillation.  Cardiolite was done in 2/18. This showed basal-mid inferolateral fixed defect, but no ischemia, EF 55%. He was transitioned from Eliquis to warfarin given valvular disease.    Echo was done in 10/18 showing EF 55% with mildly decreased RV systolic function; s/p MV repair with mild stenosis and trivial MR.    Weight is stable today. Creatinine today is 2.6, which is similar to prior.  He says that Dr. Justin Mend is planning to have him get an AV fistula placed.  He is short of breath walking about 200 yards.  Able to walk around the grocery store and go fishing.  No orthopnea/PND.  No palpitations.  He is in NSR today.  BP controlled, he is now off BP meds.     ECG (personally reviewed): NSR, LAFB, RBBB  Labs (10/17): K 4.8, creatinine 2.08, BNP 43, hgb  15.6 Labs (12/17): K 4.5, creatinine 2.36, HCT 46.3, plts 105 Labs (1/18): K 4.1, creatinine 2.5, LDL 104, BNP 175 Labs (2/18): K 4, creatinine 2.65, BNP 65, HCT 44.2 Labs (3/18): LDL 70, HDL 38 Labs (7/18): K 4.4, creatinine 2.66 Labs (11/18): K 4, creatinine 2.83 Labs (1/19): LDL 53, HDL 42 Labs (11/19): creatinine 2.6  PMH: 1. Mitral regurgitation: s/p minimally invasive mitral valve repair in 11/14 (Dr Roxy Manns).  He has developed stenosis of the repaired valve.  - TEE (10/17) with EF 45-50%, mild diffuse hypokinesis, mildly dilated RV with mildly decreased systolic function, moderate to moderate-severe tricuspid regurgitation, peak RV-RA gradient 31 mmHg, mild AI, mitral valve s/p repair with mild-moderate MR, posterior leaflet relatively fixed with mean gradient 6 mmHg and MVA 1.22 cm^2 => possible moderate mitral stenosis; there was layered thrombus along the wall of the left atrium.  - Echo (12/17, [facility] => images not available): Per report, EF 40-45%, mild LVH, severe LAE, s/p MV repair with "moderate mitral stenosis" but MVA 1.7 cm^2 via PHT, mild MR, moderate TR, PASP 45 mmHg.  - Echo (10/18): EF 55%, mild RV dilation with mildly decreased systolic function, s/p MV repair with mild mitral stenosis (mean gradient 5 mmHg and MVA 1.5 cm^2), trivial MR, trivial TR.  2. TIA: MRI head 10/17 did not show CVA.  - 1/18 carotid dopplers: No significant stenosis.  3. Atrial fibrillation: Paroxysmal.  S/p Maze procedure with MV  repair in 11/14.  - Event monitor (10/17): No atrial fibrillation 4. CKD 5. GERD 6. HTN 7. CAD: LHC (2014) with 70% proximal stenosis in a small high diagonal, otherwise no significant disease.  - Cardiolite (6/17): No ischemia or infarction.  - Cardiolite (2/18): EF 55%, no ischemia, fixed basal to mid inferolateral perfusion defect (low risk).  8. Chronic thrombocytopenia  SH: Lives in [place], not married but has girlfriend, 1-2 glasses wine/night, nonsmoker.    Family History  Problem Relation Age of Onset  . Diabetes Unknown   . Hypertension Unknown   . Coronary artery disease Unknown    ROS: All systems reviewed and negative except as per HPI.  Current Outpatient Medications  Medication Sig Dispense Refill  . febuxostat (ULORIC) 40 MG tablet     . furosemide (LASIX) 40 MG tablet Take 40 mg by mouth daily.    . nitroGLYCERIN (NITROSTAT) 0.4 MG SL tablet Place 0.4 mg under the tongue every 5 (five) minutes as needed for chest pain.    . NON FORMULARY neuonix 1 tablet once a day    . simvastatin (ZOCOR) 20 MG tablet TAKE 1 TABLET BY MOUTH EVERY DAY 90 tablet 0  . warfarin (COUMADIN) 5 MG tablet Take 5 mg by mouth daily. Take as directed per coumadin clinic     No current facility-administered medications for this encounter.    BP 138/82   Pulse 75   Wt 109 kg (240 lb 3.2 oz)   SpO2 98%   BMI 32.58 kg/m  General: NAD Neck: No JVD, no thyromegaly or thyroid nodule.  Lungs: Clear to auscultation bilaterally with normal respiratory effort. CV: Nondisplaced PMI.  Heart regular S1/S2, no S3/S4, 1/6 SEM RUSB.  No peripheral edema.  No carotid bruit.  Normal pedal pulses.  Abdomen: Soft, nontender, no hepatosplenomegaly, no distention.  Skin: Intact without lesions or rashes.  Neurologic: Alert and oriented x 3.  Psych: Normal affect. Extremities: No clubbing or cyanosis.  HEENT: Normal.   Assessment/Plan: 1. Chronic diastolic CHF: Patient had TEE 10/17 with EF 45-50% and possible moderate stenosis of the repaired mitral valve (similar to the echo done at [facility] clinic in 12/17).  Discussed mitral stenosis in the past with Dr Roxy Manns.  Stenosis is not severe.  Would hold off on surgical re-intervention for now though he may need in the future. Dyspnea improved on Lasix and off Coreg.  Cardiolite was done in 2/18 with no ischemia.  Echo was done tin 10/18 with EF 55%, mildly decreased RV systolic function, and probably only mild mitral  stenosis.  He does not look volume overloaded on exam today, breathing is stable, NYHA class II symptoms.     - He has not taken any Lasix for about 2 months.  On exam, volume looks ok, so think he can stay off Lasix.  - I will arrange for repeat echo.  2. Atrial fibrillation: Paroxysmal.  Had Maze in 11/14.  He was off anticoagulation until a TIA and was started on apixaban 2.5 mg bid.  TEE while on the low dose apixaban showed a layered mural thrombus in the LA.  He was switched over to warfarin, which he continues.  He is in NSR today.  - Continue warfarin.  3. Mitral valve disorder: History of mitral regurgitation, now s/p minimally invasive MV repair.  He has probably mild stenosis of the repaired valve, mean gradient 5 mmHg on 10/18 echo.  As above, for now will observe without redo operation.   -  Repeat echo as above.  4. CKD: Stage III.  Follows with Dr. Justin Mend.  Apparently he will be getting an AV fistula soon.  - If warfarin is stopped, would bridge with Lovenox given history of LA thrombus and TIA.   5. TIA: I suspect Mr Adams had another TIA in 1/18 with transient left-sided weakness.  TEE in 10/17 showed layered LA thrombus.  He had been on Eliquis 5 mg bid => transitioned to warfarin.  He has seen neurology.  - He will continue warfarin.  As above, bridge with Lovenox if it is stopped.  6. Hyperlipidemia: Good lipids 1/19.   [name] 07/31/2018

## 2018-08-01 ENCOUNTER — Encounter (HOSPITAL_COMMUNITY): Payer: Self-pay | Admitting: *Deleted

## 2018-08-06 ENCOUNTER — Other Ambulatory Visit: Payer: Self-pay

## 2018-08-06 ENCOUNTER — Ambulatory Visit (HOSPITAL_COMMUNITY): Payer: Medicare HMO | Attending: Cardiovascular Disease

## 2018-08-06 DIAGNOSIS — I5032 Chronic diastolic (congestive) heart failure: Secondary | ICD-10-CM | POA: Insufficient documentation

## 2018-08-20 ENCOUNTER — Ambulatory Visit (HOSPITAL_COMMUNITY)
Admission: RE | Admit: 2018-08-20 | Discharge: 2018-08-20 | Disposition: A | Discharge status: Home / Self Care | Payer: Medicare HMO | Source: Ambulatory Visit | Attending: Family Medicine | Admitting: Family Medicine

## 2018-08-20 ENCOUNTER — Other Ambulatory Visit: Payer: Self-pay

## 2018-08-20 ENCOUNTER — Encounter: Payer: Self-pay | Admitting: Surgery

## 2018-08-20 ENCOUNTER — Ambulatory Visit (INDEPENDENT_AMBULATORY_CARE_PROVIDER_SITE_OTHER)
Admission: RE | Admit: 2018-08-20 | Discharge: 2018-08-20 | Disposition: A | Discharge status: Home / Self Care | Payer: Medicare HMO | Source: Ambulatory Visit | Attending: Family Medicine | Admitting: Family Medicine

## 2018-08-20 ENCOUNTER — Encounter: Payer: Self-pay | Admitting: *Deleted

## 2018-08-20 ENCOUNTER — Ambulatory Visit: Payer: Medicare HMO | Admitting: Surgery

## 2018-08-20 VITALS — BP 152/89 | HR 69 | Temp 97.3°F | Resp 20 | Ht 72.0 in | Wt 235.0 lb

## 2018-08-20 DIAGNOSIS — N185 Chronic kidney disease, stage 5: Secondary | ICD-10-CM

## 2018-08-20 NOTE — Progress Notes (Signed)
[facility]ist [facility]  Patient name: Daniel Mayo MRN: [id number] DOB: 1935-07-25 Sex: male   REQUESTING PROVIDER:    Dr. Justin Mend   REASON FOR CONSULT:    Dialysis access  HISTORY OF PRESENT ILLNESS:   Daniel Mayo is a 83 y.o. male, who is referred today for dialysis access.  His renal failure is likely secondary to hypertensive nephrosclerosis.  He is on chronic anticoagulation for atrial fibrillation.  He is medically managed for hypertension with an ARB.  He is on a statin for hypercholesterolemia.  He is right-handed.  PAST MEDICAL HISTORY    Past Medical History:  Diagnosis Date  . AF (atrial fibrillation) ([place])   . Allergic rhinitis   . Atrial fibrillation ([place]) 02/17/2010   Persistent atrial fibrillation    . Chronic back pain   . Chronic kidney disease (CKD)   . CKD (chronic kidney disease)   . Coronary artery disease   . Elevated PSA   . Erectile dysfunction   . GERD (gastroesophageal reflux disease)   . HOH (hard of hearing)   . Hyperlipidemia   . Hypertension   . Hypogonadism male   . Insomnia   . Left ventricular hypertrophy   . Mitral regurgitation 07/12/2013  . Mitral regurgitation   . Mitral stenosis   . Renal disorder   . S/P Maze operation for atrial fibrillation 08/14/2013   Complete bilateral atrial lesion set using cryothermy and bipolar radiofrequency ablation  . S/P minimally invasive mitral valve repair + maze procedure 08/14/2013   28 mm Sorin Memo 3D ring annuloplasty via right mini thoracotomy approach  . SOB (shortness of breath)   . Somnolence   . Stenosis of carotid artery   . Tricuspid regurgitation      FAMILY HISTORY   Family History  Problem Relation Age of Onset  . Diabetes Unknown   . Hypertension Unknown   . Coronary artery disease Unknown     SOCIAL HISTORY:   Social History   Socioeconomic History  . Marital status: Single    Spouse name: Not on file    . Number of children: Not on file  . Years of education: Not on file  . Highest education level: Not on file  Occupational History  . Occupation: retired  Scientific laboratory technician  . Financial resource strain: Not on file  . Food insecurity:    Worry: Not on file    Inability: Not on file  . Transportation needs:    Medical: Not on file    Non-medical: Not on file  Tobacco Use  . Smoking status: Never Smoker  . Smokeless tobacco: Never Used  . Tobacco comment: stopped smoking in his 20's  Substance and Sexual Activity  . Alcohol use: Yes    Alcohol/week: 14.0 standard drinks    Types: 14 Glasses of wine per week    Comment: 1- 2 glasses of wine a night  . Drug use: No  . Sexual activity: Not on file  Lifestyle  . Physical activity:    Days per week: Not on file    Minutes per session: Not on file  . Stress: Not on file  Relationships  . Social connections:    Talks on phone: Not on file    Gets together: Not on file    Attends religious service: Not on file    Active member of club or organization: Not on file    Attends meetings of clubs or organizations: Not  on file    Relationship status: Not on file  . Intimate partner violence:    Fear of current or ex partner: Not on file    Emotionally abused: Not on file    Physically abused: Not on file    Forced sexual activity: Not on file  Other Topics Concern  . Not on file  Social History Narrative  . Not on file    ALLERGIES:    No Known Allergies  CURRENT MEDICATIONS:    Current Outpatient Medications  Medication Sig Dispense Refill  . furosemide (LASIX) 40 MG tablet Take 40 mg by mouth daily.    . nitroGLYCERIN (NITROSTAT) 0.4 MG SL tablet Place 0.4 mg under the tongue every 5 (five) minutes as needed for chest pain.    . NON FORMULARY neuonix 1 tablet once a day    . simvastatin (ZOCOR) 20 MG tablet TAKE 1 TABLET BY MOUTH EVERY DAY 90 tablet 0  . warfarin (COUMADIN) 5 MG tablet Take 5 mg by mouth daily. Take as  directed per coumadin clinic    . febuxostat (ULORIC) 40 MG tablet      No current facility-administered medications for this visit.     REVIEW OF SYSTEMS:   [X]  denotes positive finding, [ ]  denotes negative finding Cardiac  Comments:  Chest pain or chest pressure:    Shortness of breath upon exertion: x   Short of breath when lying flat:    Irregular heart rhythm:        Vascular    Pain in calf, thigh, or hip brought on by ambulation:    Pain in feet at night that wakes you up from your sleep:     Blood clot in your veins:    Leg swelling:         Pulmonary    Oxygen at home:    Productive cough:     Wheezing:         Neurologic    Sudden weakness in arms or legs:     Sudden numbness in arms or legs:     Sudden onset of difficulty speaking or slurred speech:    Temporary loss of vision in one eye:     Problems with dizziness:         Gastrointestinal    Blood in stool:      Vomited blood:         Genitourinary    Burning when urinating:     Blood in urine:        Psychiatric    Major depression:         Hematologic    Bleeding problems:    Problems with blood clotting too easily:        Skin    Rashes or ulcers:        Constitutional    Fever or chills:     PHYSICAL EXAM:   Vitals:   08/20/18 1442  BP: (!) 152/89  Pulse: 69  Resp: 20  Temp: (!) 97.3 F (36.3 C)  SpO2: 96%  Weight: 235 lb (106.6 kg)  Height: 6' (1.829 m)    GENERAL: The patient is a well-nourished male, in no acute distress. The vital signs are documented above. CARDIAC: There is a regular rate and rhythm.  VASCULAR: Palpable left radial pulse PULMONARY: Nonlabored respirations MUSCULOSKELETAL: There are no major deformities or cyanosis. NEUROLOGIC: No focal weakness or paresthesias are detected. SKIN: There are no ulcers or rashes noted. PSYCHIATRIC:  The patient has a normal affect.  STUDIES:   I have ordered and reviewed the  following: +-----------------+-------------+----------+--------+ Right Cephalic  Diameter (cm)Depth (cm)Findings +-----------------+-------------+----------+--------+ Shoulder       0.27             +-----------------+-------------+----------+--------+ Prox upper arm    0.30             +-----------------+-------------+----------+--------+ Mid upper arm    0.30             +-----------------+-------------+----------+--------+ Dist upper arm    0.33             +-----------------+-------------+----------+--------+ Antecubital fossa  0.35             +-----------------+-------------+----------+--------+ Prox forearm     0.32             +-----------------+-------------+----------+--------+ Mid forearm     0.30             +-----------------+-------------+----------+--------+ Dist forearm     0.26             +-----------------+-------------+----------+--------+  +-----------------+-------------+----------+--------+ Right Basilic  Diameter (cm)Depth (cm)Findings +-----------------+-------------+----------+--------+ Prox upper arm    0.62             +-----------------+-------------+----------+--------+ Mid upper arm    0.56             +-----------------+-------------+----------+--------+ Dist upper arm    0.59             +-----------------+-------------+----------+--------+ Antecubital fossa  0.44             +-----------------+-------------+----------+--------+ Prox forearm     0.37             +-----------------+-------------+----------+--------+  +-----------------+-------------+----------+--------+ Left Cephalic  Diameter (cm)Depth  (cm)Findings +-----------------+-------------+----------+--------+ Shoulder       0.16             +-----------------+-------------+----------+--------+ Prox upper arm    0.20             +-----------------+-------------+----------+--------+ Mid upper arm    0.28             +-----------------+-------------+----------+--------+ Dist upper arm    0.22             +-----------------+-------------+----------+--------+ Antecubital fossa  0.58             +-----------------+-------------+----------+--------+ Prox forearm     0.63             +-----------------+-------------+----------+--------+ Mid forearm     0.39             +-----------------+-------------+----------+--------+ Dist forearm     0.28             +-----------------+-------------+----------+--------+  +-----------------+-------------+----------+--------+ Left Basilic   Diameter (cm)Depth (cm)Findings +-----------------+-------------+----------+--------+ Prox upper arm    0.55             +-----------------+-------------+----------+--------+ Mid upper arm    0.77             +-----------------+-------------+----------+--------+ Dist upper arm    0.64             +-----------------+-------------+----------+--------+ Antecubital fossa  0.46             +-----------------+-------------+----------+--------+ Prox forearm     0.25             +-----------------+-------------+----------+--------+  Normal arterial duplex  ASSESSMENT and PLAN   CKD 5: I discussed proceeding with a left arm fistula.  Most likely this will be a first stage basilic vein fistula creation.  He  understands that he will need a second operation.  We discussed the risk benefits the operation  including the risk of not maturity and the risk of steal syndrome.  All of his questions were answered.  His operation will be scheduled for Thursday, December 12.  We will stop his Coumadin prior to his operation.   Annamarie Major, MD [facility] Specialists of [facility] [phone] Pager [phone]

## 2018-08-21 ENCOUNTER — Other Ambulatory Visit: Payer: Self-pay | Admitting: *Deleted

## 2018-08-28 ENCOUNTER — Telehealth: Payer: Self-pay | Admitting: *Deleted

## 2018-08-28 NOTE — Telephone Encounter (Signed)
Patient called with date change for surgery. Instructed to be at [facility] admitting at 7:30 am on 09/13/18. Reminded to hold Coumadin for 3 days pre-op and expect a call from [facility] re:Lovenox bridge. All other pre op instructions unchanged. Verbalized understanding.

## 2018-08-28 NOTE — Telephone Encounter (Signed)
Call to [facility] clinic and spoke with CHRISTY with surgery date change.

## 2018-09-11 ENCOUNTER — Encounter (HOSPITAL_COMMUNITY): Payer: Self-pay | Admitting: *Deleted

## 2018-09-11 ENCOUNTER — Other Ambulatory Visit: Payer: Self-pay

## 2018-09-11 NOTE — Progress Notes (Signed)
Daniel Mayo denies chest pain, he jets short of breath with much activity.  "Nt as short of breath as I did before Mitral Valve surgery." Patient's cardiologist is Dr, he was seen at the office 07/31/18, cardiologist was aware of up coming surgery. Daniel Mayo is on Lovenox bridge, he stopped Coumadin on 09/09/18.  [facility] is ordering Lovenox. Patient does not know if he is to take it the morning of surgery or not.  I called Becky at [facility] and she said that it is up to [facility].  I instructed patient to call [facility] in am and to tell them that he has to know something Wednesday. (patient said it is hard to get an answer at times).

## 2018-09-12 ENCOUNTER — Telehealth: Payer: Self-pay | Admitting: *Deleted

## 2018-09-12 NOTE — Telephone Encounter (Signed)
Call from [facility] at Dr. Jason Nest office. Cancel surgery for access at this time. Labs improved. Spoke with patient who is aware and has instructions from [facility] clinic regarding Lovenox to Coumadin.

## 2018-09-13 ENCOUNTER — Ambulatory Visit (HOSPITAL_COMMUNITY): Admission: RE | Admit: 2018-09-13 | Payer: Medicare HMO | Source: Home / Self Care | Admitting: Surgery

## 2018-09-13 HISTORY — DX: Dyspnea, unspecified: R06.00

## 2018-09-13 HISTORY — DX: Unspecified osteoarthritis, unspecified site: M19.90

## 2018-09-13 HISTORY — DX: Pneumonia, unspecified organism: J18.9

## 2018-09-13 HISTORY — DX: Cardiac arrhythmia, unspecified: I49.9

## 2018-09-13 HISTORY — DX: Polyneuropathy, unspecified: G62.9

## 2018-09-13 SURGERY — TRANSPOSITION, VEIN, BASILIC
Anesthesia: Choice | Laterality: Left

## 2018-09-17 ENCOUNTER — Other Ambulatory Visit: Payer: Self-pay | Admitting: Nephrology

## 2018-09-17 DIAGNOSIS — N184 Chronic kidney disease, stage 4 (severe): Secondary | ICD-10-CM

## 2018-09-21 ENCOUNTER — Ambulatory Visit
Admission: RE | Admit: 2018-09-21 | Discharge: 2018-09-21 | Disposition: A | Discharge status: Home / Self Care | Payer: Medicare HMO | Source: Ambulatory Visit | Attending: Nephrology | Admitting: Nephrology

## 2018-09-21 DIAGNOSIS — N184 Chronic kidney disease, stage 4 (severe): Secondary | ICD-10-CM

## 2019-04-16 ENCOUNTER — Other Ambulatory Visit (HOSPITAL_COMMUNITY): Payer: Self-pay | Admitting: Cardiology

## 2019-06-19 ENCOUNTER — Telehealth (HOSPITAL_COMMUNITY): Payer: Self-pay

## 2019-06-19 NOTE — Telephone Encounter (Signed)
Received a request from [place] [place] for Medical Records. Faxed to [phone] on 06/19/2019.

## 2019-09-13 ENCOUNTER — Other Ambulatory Visit (HOSPITAL_COMMUNITY): Payer: Self-pay

## 2019-09-13 MED ORDER — FUROSEMIDE 40 MG PO TABS: 40.0000 mg | ORAL_TABLET | Freq: Every day | ORAL | 3 refills | Status: DC

## 2019-09-16 ENCOUNTER — Other Ambulatory Visit (HOSPITAL_COMMUNITY): Payer: Self-pay

## 2019-09-16 MED ORDER — FUROSEMIDE 40 MG PO TABS: 40.0000 mg | ORAL_TABLET | Freq: Every day | ORAL | 3 refills | Status: DC

## 2020-02-10 ENCOUNTER — Telehealth (HOSPITAL_COMMUNITY): Payer: Self-pay | Admitting: *Deleted

## 2020-02-10 NOTE — Telephone Encounter (Signed)
Pt called stating he has been more short of breath and having swelling. Pt said his shortness of breath gets so bad at night sometimes he cant sleep. Pt requested an office visit to discuss further with Dr.McLean. Appointment scheduled.

## 2020-02-13 ENCOUNTER — Other Ambulatory Visit: Payer: Self-pay

## 2020-02-13 ENCOUNTER — Other Ambulatory Visit (HOSPITAL_COMMUNITY): Payer: Self-pay

## 2020-02-13 ENCOUNTER — Encounter (HOSPITAL_COMMUNITY): Payer: Self-pay | Admitting: Cardiology

## 2020-02-13 ENCOUNTER — Ambulatory Visit (HOSPITAL_COMMUNITY)
Admission: RE | Admit: 2020-02-13 | Discharge: 2020-02-13 | Disposition: A | Discharge status: Home / Self Care | Payer: Medicare HMO | Source: Ambulatory Visit | Attending: Cardiology | Admitting: Cardiology

## 2020-02-13 VITALS — BP 152/104 | HR 73 | Wt 221.8 lb

## 2020-02-13 DIAGNOSIS — Z8673 Personal history of transient ischemic attack (TIA), and cerebral infarction without residual deficits: Secondary | ICD-10-CM | POA: Diagnosis not present

## 2020-02-13 DIAGNOSIS — I480 Paroxysmal atrial fibrillation: Secondary | ICD-10-CM

## 2020-02-13 DIAGNOSIS — E785 Hyperlipidemia, unspecified: Secondary | ICD-10-CM | POA: Diagnosis not present

## 2020-02-13 DIAGNOSIS — I081 Rheumatic disorders of both mitral and tricuspid valves: Secondary | ICD-10-CM | POA: Insufficient documentation

## 2020-02-13 DIAGNOSIS — I2510 Atherosclerotic heart disease of native coronary artery without angina pectoris: Secondary | ICD-10-CM | POA: Diagnosis not present

## 2020-02-13 DIAGNOSIS — I4510 Unspecified right bundle-branch block: Secondary | ICD-10-CM | POA: Insufficient documentation

## 2020-02-13 DIAGNOSIS — K219 Gastro-esophageal reflux disease without esophagitis: Secondary | ICD-10-CM | POA: Diagnosis not present

## 2020-02-13 DIAGNOSIS — Z79899 Other long term (current) drug therapy: Secondary | ICD-10-CM | POA: Insufficient documentation

## 2020-02-13 DIAGNOSIS — Z7901 Long term (current) use of anticoagulants: Secondary | ICD-10-CM | POA: Diagnosis not present

## 2020-02-13 DIAGNOSIS — I130 Hypertensive heart and chronic kidney disease with heart failure and stage 1 through stage 4 chronic kidney disease, or unspecified chronic kidney disease: Secondary | ICD-10-CM | POA: Diagnosis not present

## 2020-02-13 DIAGNOSIS — N185 Chronic kidney disease, stage 5: Secondary | ICD-10-CM | POA: Diagnosis not present

## 2020-02-13 DIAGNOSIS — I4891 Unspecified atrial fibrillation: Secondary | ICD-10-CM

## 2020-02-13 DIAGNOSIS — I5032 Chronic diastolic (congestive) heart failure: Secondary | ICD-10-CM | POA: Diagnosis not present

## 2020-02-13 DIAGNOSIS — Z8249 Family history of ischemic heart disease and other diseases of the circulatory system: Secondary | ICD-10-CM | POA: Diagnosis not present

## 2020-02-13 DIAGNOSIS — N184 Chronic kidney disease, stage 4 (severe): Secondary | ICD-10-CM | POA: Insufficient documentation

## 2020-02-13 LAB — CBC
HCT: 45.7 % (ref 39.0–52.0)
Hemoglobin: 14.7 g/dL (ref 13.0–17.0)
MCH: 33.4 pg (ref 26.0–34.0)
MCHC: 32.2 g/dL (ref 30.0–36.0)
MCV: 103.9 fL — ABNORMAL HIGH (ref 80.0–100.0)
Platelets: 80 10*3/uL — ABNORMAL LOW (ref 150–400)
RBC: 4.4 MIL/uL (ref 4.22–5.81)
RDW: 13.1 % (ref 11.5–15.5)
WBC: 4.8 10*3/uL (ref 4.0–10.5)
nRBC: 0 % (ref 0.0–0.2)

## 2020-02-13 LAB — BASIC METABOLIC PANEL
Anion gap: 10 (ref 5–15)
BUN: 40 mg/dL — ABNORMAL HIGH (ref 8–23)
CO2: 26 mmol/L (ref 22–32)
Calcium: 9.4 mg/dL (ref 8.9–10.3)
Chloride: 106 mmol/L (ref 98–111)
Creatinine, Ser: 3.09 mg/dL — ABNORMAL HIGH (ref 0.61–1.24)
GFR calc Af Amer: 21 mL/min — ABNORMAL LOW (ref >60)
GFR calc non Af Amer: 18 mL/min — ABNORMAL LOW (ref >60)
Glucose, Bld: 109 mg/dL — ABNORMAL HIGH (ref 70–99)
Potassium: 3.8 mmol/L (ref 3.5–5.1)
Sodium: 142 mmol/L (ref 135–145)

## 2020-02-13 LAB — PROTIME-INR
INR: 2.4 — ABNORMAL HIGH (ref 0.8–1.2)
Prothrombin Time: 25.4 seconds — ABNORMAL HIGH (ref 11.4–15.2)

## 2020-02-13 LAB — BRAIN NATRIURETIC PEPTIDE: B Natriuretic Peptide: 388 pg/mL — ABNORMAL HIGH (ref 0.0–100.0)

## 2020-02-13 MED ORDER — METOPROLOL SUCCINATE ER 25 MG PO TB24
25.0000 mg | ORAL_TABLET | Freq: Every day | ORAL | 3 refills | Status: DC
Start: 2020-02-13 — End: 2021-01-11

## 2020-02-13 MED ORDER — FUROSEMIDE 40 MG PO TABS: 40.0000 mg | ORAL_TABLET | Freq: Two times a day (BID) | ORAL | 5 refills | Status: DC

## 2020-02-13 MED ORDER — AMIODARONE HCL 200 MG PO TABS
ORAL_TABLET | ORAL | 3 refills | Status: DC
Start: 2020-02-13 — End: 2020-05-07

## 2020-02-13 NOTE — Patient Instructions (Addendum)
INCREASE Lasix to 40mg  (1 tab) twice a day  START Amiodarone 200mg  (1 tab) twice a day for 2 weeks THEN 1 tab daily after that  STOP Metoprolol tartrate   START Metoprolol succinate (Toprol XL) 25mg  (1 tab) daily  Labs today and repeat in 10 days We will only contact you if something comes back abnormal or we need to make some changes. Otherwise no news is good news!  Your physician recommends that you schedule a follow-up appointment in: 3 weeks with the Nurse Practitioner/Physician Assistant    Please call office at [phone] option [phone] if you have any questions or concerns.    Dear Daniel Mayo,  Daniel Mayo are scheduled for a TEE/Cardioversion on Tuesday, May 25th, 2021 with Dr. Aundra Dubin.  Please arrive at the [facility] (Main Entrance A) at [facility]: [address] [place], [place] [address] at 9:30 am.   DIET: Nothing to eat or drink after midnight except a sip of water with medications (see medication instructions below)  Medication Instructions: Hold Lasix and vitamin D on the morning of your procedure  Continue your anticoagulant: Coumadin   You will need to continue your anticoagulant after your procedure  until you are told by your provider that it is safe to stop  You can take other morning medications with a small sip of water by 6am on the morning of your procedure.    Labs: today in office  You will need a pre procedure COVID test    WHEN: Saturday May 22nd, 2021at 10:55am WHERE: [facility] [address]  [place] [id number]  This is a drive thru testing site, you will remain in your car. Be sure to get in the line FOR PROCEDURES Once you have been swabbed you will need to remain home in quarantine until you return for your procedure.   You must have a responsible person to drive you home and stay in the waiting area during your procedure. Failure to do so could result in cancellation.  Bring your insurance cards.  *Special Note: Every  effort is made to have your procedure done on time. Occasionally there are emergencies that occur at the hospital that may cause delays. Please be patient if a delay does occur.   Daniel Mayo Kitchen

## 2020-02-14 NOTE — H&P (View-Only) (Signed)
PCP: Lennice Sites [facility] Cardiology: Dr. Aundra Dubin  85 y.o. with history of paroxysmal atrial fibrillation and severe mitral regurgitation s/p minimally invasive MV repair and Maze in 11/14 returns for followup of valvular disease and dyspnea.  He developed increased exertional dyspnea over the 6 months prior to the initial evaluation in this office.  He was short of breath walking a short distance or doing housework like vacuuming.  He also had had a TIA  and was started on apixaban 2.5 mg bid (he had not been anticoagulated since around the time of his prior surgery). He was sent back to Dr Roxy Manns for re-evaluation, there was concern for stenosis of the repaired mitral valve.  He had a TEE in 10/17 showing EF 45-50% range with moderate to moderate-severe tricuspid regurgitation, mildly decreased RV systolic function, and possibly moderate stenosis of the repaired mitral valve (mild to moderate regurgitation).  Most significantly, there was a layered thrombus along the left atrial wall.  I increased his dose of apixaban to 5 mg bid at that time. He was seen in followup by Dr. Roxy Manns, no plan for redo surgery.  Event monitor in 10/17 showed no atrial fibrillation.  Cardiolite was done in 2/18. This showed basal-mid inferolateral fixed defect, but no ischemia, EF 55%. He was transitioned from Eliquis to warfarin given valvular disease.    Echo was done in 10/18 showing EF 55% with mildly decreased RV systolic function; s/p MV repair with mild stenosis and trivial MR.  Most recent echo in 5/21 done at the hospital in [facility] showed EF 50-55%, mild LVH, severely dilated RV, s/p MV repair with mild MR, dilated IVC.   Patient was admitted to [facility] in 5/21 with atrial fibrillation and CHF.  He had been short of breath for 2-3 months.  He was diuresed with IV Lasix and sent home, no DCCV.   He returns today for followup of diastolic CHF and atrial fibrillation.  As above, he has been short of breath for 2-3 months.   Hospitalization earlier this month really did not seem to help much.  He has to stop to catch his breath twice during a 100 yard walk.  He is short of breath with hills/inclines.  3 pillow orthopnea.  No chest pain.      ECG (personally reviewed): Atrial fibrillation, RBBB  Labs (10/17): K 4.8, creatinine 2.08, BNP 43, hgb 15.6 Labs (12/17): K 4.5, creatinine 2.36, HCT 46.3, plts 105 Labs (1/18): K 4.1, creatinine 2.5, LDL 104, BNP 175 Labs (2/18): K 4, creatinine 2.65, BNP 65, HCT 44.2 Labs (3/18): LDL 70, HDL 38 Labs (7/18): K 4.4, creatinine 2.66 Labs (11/18): K 4, creatinine 2.83 Labs (1/19): LDL 53, HDL 42 Labs (11/19): creatinine 2.6 Labs (5/21): K 3.9, creatinine 3.09  PMH: 1. Mitral regurgitation: s/p minimally invasive mitral valve repair in 11/14 (Dr Roxy Manns).  He has developed stenosis of the repaired valve.  - TEE (10/17) with EF 45-50%, mild diffuse hypokinesis, mildly dilated RV with mildly decreased systolic function, moderate to moderate-severe tricuspid regurgitation, peak RV-RA gradient 31 mmHg, mild AI, mitral valve s/p repair with mild-moderate MR, posterior leaflet relatively fixed with mean gradient 6 mmHg and MVA 1.22 cm^2 => possible moderate mitral stenosis; there was layered thrombus along the wall of the left atrium.  - Echo (12/17, [place] => images not available): Per report, EF 40-45%, mild LVH, severe LAE, s/p MV repair with "moderate mitral stenosis" but MVA 1.7 cm^2 via PHT, mild MR, moderate TR,  PASP 45 mmHg.  - Echo (10/18): EF 55%, mild RV dilation with mildly decreased systolic function, s/p MV repair with mild mitral stenosis (mean gradient 5 mmHg and MVA 1.5 cm^2), trivial MR, trivial TR.  - Echo (11/19): EF 55-60%, s/p MV repair with moderate functional stenosis and mild MR, severe LAE, moderate TR.   - Echo (5/21, [facility]): EF 50-55%, mild LVH, severe RV dilation with normal systolic function, s/p MV repair with mild MR, dilated IVC.  2. TIA:  MRI head 10/17 did not show CVA.  - 1/18 carotid dopplers: No significant stenosis.  3. Atrial fibrillation: Paroxysmal.  S/p Maze procedure with MV repair in 11/14.  - Event monitor (10/17): No atrial fibrillation 4. CKD stage IV 5. GERD 6. HTN 7. CAD: LHC (2014) with 70% proximal stenosis in a small high diagonal, otherwise no significant disease.  - Cardiolite (6/17): No ischemia or infarction.  - Cardiolite (2/18): EF 55%, no ischemia, fixed basal to mid inferolateral perfusion defect (low risk).  8. Chronic thrombocytopenia  SH: Lives in [place], not married but has girlfriend, 1-2 glasses wine/night, nonsmoker.   Family History  Problem Relation Age of Onset  . Diabetes Other   . Hypertension Other   . Coronary artery disease Other    ROS: All systems reviewed and negative except as per HPI.  Current Outpatient Medications  Medication Sig Dispense Refill  . cholecalciferol (VITAMIN D3) 25 MCG (1000 UNIT) tablet Take 1,000 Units by mouth daily.    . furosemide (LASIX) 40 MG tablet Take 1 tablet (40 mg total) by mouth 2 (two) times daily. 60 tablet 5  . Multiple Vitamins-Minerals (MULTIVITAL-M PO) Take by mouth.    . nitroGLYCERIN (NITROSTAT) 0.4 MG SL tablet Place 0.4 mg under the tongue every 5 (five) minutes as needed for chest pain.    . NON FORMULARY Take 1 capsule by mouth daily. NEURONIX Advanced Nerve Support    . simvastatin (ZOCOR) 40 MG tablet Take 40 mg by mouth at bedtime.    Marland Kitchen warfarin (COUMADIN) 5 MG tablet Take 5 mg by mouth daily.     Marland Kitchen amiodarone (PACERONE) 200 MG tablet Take 1 tab twice a day for two weeks then 1 tab daily after that 45 tablet 3  . metoprolol succinate (TOPROL-XL) 25 MG 24 hr tablet Take 1 tablet (25 mg total) by mouth daily. Take with or immediately following a meal. 90 tablet 3   No current facility-administered medications for this encounter.   BP (!) 152/104   Pulse 73   Wt 100.6 kg (221 lb 12.8 oz)   SpO2 95%   BMI 30.08 kg/m   General: NAD Neck: JVP 8-9 cm, no thyromegaly or thyroid nodule.  Lungs: Clear to auscultation bilaterally with normal respiratory effort. CV: Nondisplaced PMI.  Heart irregular S1/S2, no S3/S4, no murmur.  1+ ankle edema.  No carotid bruit.  Normal pedal pulses.  Abdomen: Soft, nontender, no hepatosplenomegaly, no distention.  Skin: Intact without lesions or rashes.  Neurologic: Alert and oriented x 3.  Psych: Normal affect. Extremities: No clubbing or cyanosis.  HEENT: Normal.   Assessment/Plan: 1. Chronic diastolic CHF: Patient had TEE 10/17 with EF 45-50% and possible moderate stenosis of the repaired mitral valve (similar to the echo done at [facility] clinic in 12/17).  Discussed mitral stenosis in the past with Dr Roxy Manns.  Stenosis is not severe.  Would hold off on surgical re-intervention for now though he may need in the future. Dyspnea  improved on Lasix and off Coreg.  Cardiolite was done in 2/18 with no ischemia.  Echo was done in 10/18 with EF 55%, mildly decreased RV systolic function, and probably only mild mitral stenosis.  Echo in 5/21 with EF 50-55%, severe RV dilation, s/p MV repair with mild MR, dilated IVC.  He has had increased dyspnea, NYHA class III, for 2-3 months.  He is back in atrial fibrillation, which may have triggered the CHF exacerbation.  Recently in hospital in [facility] for diuresis. He is still volume overloaded on exam.  - Increase Lasix to 40 mg po bid. BMET today and in 10 days.  - Need to get him back into NSR, see below.   2. Atrial fibrillation: Paroxysmal.  Had Maze in 11/14.  He was off anticoagulation until a TIA and was started on apixaban 2.5 mg bid.  TEE while on the low dose apixaban showed a layered mural thrombus in the LA.  He was switched over to warfarin, which he continues.  He has been back in atrial fibrillation recently, which may have triggered CHF exacerbation.  - Continue warfarin.  - Start amiodarone 200 mg bid x 2 wks then 200 mg daily to  try to maintain NSR.  - I will arrange for TEE-guided DCCV if he remains in atrial fibrillation in about a week.  Will get INR day of procedure. I discussed risks/benefits with patient and he agrees to procedure.  - Stop metoprolol tartrate, start Toprol XL 25 mg daily.  3. Mitral valve disorder: History of mitral regurgitation, now s/p minimally invasive MV repair.   - Reassess with TEE as above.   4. CKD: Stage IV.  Follows with Dr. Justin Mend. Gradually worsening renal function. Follow closely with diuresis.  5. TIA: I suspect Mr Cooler had another TIA in 1/18 with transient left-sided weakness.  TEE in 10/17 showed layered LA thrombus.  He had been on Eliquis => transitioned to warfarin.  He has seen neurology.  - He will continue warfarin.  As above, bridge with Lovenox if it is stopped. Check CBC.  6. Hyperlipidemia: Will need to check lipids next appt.   Followup in 3 wks with NP/PA.   [name] 02/14/2020

## 2020-02-14 NOTE — Progress Notes (Signed)
PCP: Lennice Sites [facility] Cardiology: Dr. Aundra Dubin  85 y.o. with history of paroxysmal atrial fibrillation and severe mitral regurgitation s/p minimally invasive MV repair and Maze in 11/14 returns for followup of valvular disease and dyspnea.  He developed increased exertional dyspnea over the 6 months prior to the initial evaluation in this office.  He was short of breath walking a short distance or doing housework like vacuuming.  He also had had a TIA  and was started on apixaban 2.5 mg bid (he had not been anticoagulated since around the time of his prior surgery). He was sent back to Dr Roxy Manns for re-evaluation, there was concern for stenosis of the repaired mitral valve.  He had a TEE in 10/17 showing EF 45-50% range with moderate to moderate-severe tricuspid regurgitation, mildly decreased RV systolic function, and possibly moderate stenosis of the repaired mitral valve (mild to moderate regurgitation).  Most significantly, there was a layered thrombus along the left atrial wall.  I increased his dose of apixaban to 5 mg bid at that time. He was seen in followup by Dr. Roxy Manns, no plan for redo surgery.  Event monitor in 10/17 showed no atrial fibrillation.  Cardiolite was done in 2/18. This showed basal-mid inferolateral fixed defect, but no ischemia, EF 55%. He was transitioned from Eliquis to warfarin given valvular disease.    Echo was done in 10/18 showing EF 55% with mildly decreased RV systolic function; s/p MV repair with mild stenosis and trivial MR.  Most recent echo in 5/21 done at the hospital in [facility] showed EF 50-55%, mild LVH, severely dilated RV, s/p MV repair with mild MR, dilated IVC.   Patient was admitted to [facility] in 5/21 with atrial fibrillation and CHF.  He had been short of breath for 2-3 months.  He was diuresed with IV Lasix and sent home, no DCCV.   He returns today for followup of diastolic CHF and atrial fibrillation.  As above, he has been short of breath for 2-3 months.   Hospitalization earlier this month really did not seem to help much.  He has to stop to catch his breath twice during a 100 yard walk.  He is short of breath with hills/inclines.  3 pillow orthopnea.  No chest pain.      ECG (personally reviewed): Atrial fibrillation, RBBB  Labs (10/17): K 4.8, creatinine 2.08, BNP 43, hgb 15.6 Labs (12/17): K 4.5, creatinine 2.36, HCT 46.3, plts 105 Labs (1/18): K 4.1, creatinine 2.5, LDL 104, BNP 175 Labs (2/18): K 4, creatinine 2.65, BNP 65, HCT 44.2 Labs (3/18): LDL 70, HDL 38 Labs (7/18): K 4.4, creatinine 2.66 Labs (11/18): K 4, creatinine 2.83 Labs (1/19): LDL 53, HDL 42 Labs (11/19): creatinine 2.6 Labs (5/21): K 3.9, creatinine 3.09  PMH: 1. Mitral regurgitation: s/p minimally invasive mitral valve repair in 11/14 (Dr Roxy Manns).  He has developed stenosis of the repaired valve.  - TEE (10/17) with EF 45-50%, mild diffuse hypokinesis, mildly dilated RV with mildly decreased systolic function, moderate to moderate-severe tricuspid regurgitation, peak RV-RA gradient 31 mmHg, mild AI, mitral valve s/p repair with mild-moderate MR, posterior leaflet relatively fixed with mean gradient 6 mmHg and MVA 1.22 cm^2 => possible moderate mitral stenosis; there was layered thrombus along the wall of the left atrium.  - Echo (12/17, [place] => images not available): Per report, EF 40-45%, mild LVH, severe LAE, s/p MV repair with "moderate mitral stenosis" but MVA 1.7 cm^2 via PHT, mild MR, moderate TR,  PASP 45 mmHg.  - Echo (10/18): EF 55%, mild RV dilation with mildly decreased systolic function, s/p MV repair with mild mitral stenosis (mean gradient 5 mmHg and MVA 1.5 cm^2), trivial MR, trivial TR.  - Echo (11/19): EF 55-60%, s/p MV repair with moderate functional stenosis and mild MR, severe LAE, moderate TR.   - Echo (5/21, [facility]): EF 50-55%, mild LVH, severe RV dilation with normal systolic function, s/p MV repair with mild MR, dilated IVC.  2. TIA:  MRI head 10/17 did not show CVA.  - 1/18 carotid dopplers: No significant stenosis.  3. Atrial fibrillation: Paroxysmal.  S/p Maze procedure with MV repair in 11/14.  - Event monitor (10/17): No atrial fibrillation 4. CKD stage IV 5. GERD 6. HTN 7. CAD: LHC (2014) with 70% proximal stenosis in a small high diagonal, otherwise no significant disease.  - Cardiolite (6/17): No ischemia or infarction.  - Cardiolite (2/18): EF 55%, no ischemia, fixed basal to mid inferolateral perfusion defect (low risk).  8. Chronic thrombocytopenia  SH: Lives in [place], not married but has girlfriend, 1-2 glasses wine/night, nonsmoker.   Family History  Problem Relation Age of Onset  . Diabetes Other   . Hypertension Other   . Coronary artery disease Other    ROS: All systems reviewed and negative except as per HPI.  Current Outpatient Medications  Medication Sig Dispense Refill  . cholecalciferol (VITAMIN D3) 25 MCG (1000 UNIT) tablet Take 1,000 Units by mouth daily.    . furosemide (LASIX) 40 MG tablet Take 1 tablet (40 mg total) by mouth 2 (two) times daily. 60 tablet 5  . Multiple Vitamins-Minerals (MULTIVITAL-M PO) Take by mouth.    . nitroGLYCERIN (NITROSTAT) 0.4 MG SL tablet Place 0.4 mg under the tongue every 5 (five) minutes as needed for chest pain.    . NON FORMULARY Take 1 capsule by mouth daily. NEURONIX Advanced Nerve Support    . simvastatin (ZOCOR) 40 MG tablet Take 40 mg by mouth at bedtime.    Marland Kitchen warfarin (COUMADIN) 5 MG tablet Take 5 mg by mouth daily.     Marland Kitchen amiodarone (PACERONE) 200 MG tablet Take 1 tab twice a day for two weeks then 1 tab daily after that 45 tablet 3  . metoprolol succinate (TOPROL-XL) 25 MG 24 hr tablet Take 1 tablet (25 mg total) by mouth daily. Take with or immediately following a meal. 90 tablet 3   No current facility-administered medications for this encounter.   BP (!) 152/104   Pulse 73   Wt 100.6 kg (221 lb 12.8 oz)   SpO2 95%   BMI 30.08 kg/m   General: NAD Neck: JVP 8-9 cm, no thyromegaly or thyroid nodule.  Lungs: Clear to auscultation bilaterally with normal respiratory effort. CV: Nondisplaced PMI.  Heart irregular S1/S2, no S3/S4, no murmur.  1+ ankle edema.  No carotid bruit.  Normal pedal pulses.  Abdomen: Soft, nontender, no hepatosplenomegaly, no distention.  Skin: Intact without lesions or rashes.  Neurologic: Alert and oriented x 3.  Psych: Normal affect. Extremities: No clubbing or cyanosis.  HEENT: Normal.   Assessment/Plan: 1. Chronic diastolic CHF: Patient had TEE 10/17 with EF 45-50% and possible moderate stenosis of the repaired mitral valve (similar to the echo done at [facility] clinic in 12/17).  Discussed mitral stenosis in the past with Dr Roxy Manns.  Stenosis is not severe.  Would hold off on surgical re-intervention for now though he may need in the future. Dyspnea  improved on Lasix and off Coreg.  Cardiolite was done in 2/18 with no ischemia.  Echo was done in 10/18 with EF 55%, mildly decreased RV systolic function, and probably only mild mitral stenosis.  Echo in 5/21 with EF 50-55%, severe RV dilation, s/p MV repair with mild MR, dilated IVC.  He has had increased dyspnea, NYHA class III, for 2-3 months.  He is back in atrial fibrillation, which may have triggered the CHF exacerbation.  Recently in hospital in [facility] for diuresis. He is still volume overloaded on exam.  - Increase Lasix to 40 mg po bid. BMET today and in 10 days.  - Need to get him back into NSR, see below.   2. Atrial fibrillation: Paroxysmal.  Had Maze in 11/14.  He was off anticoagulation until a TIA and was started on apixaban 2.5 mg bid.  TEE while on the low dose apixaban showed a layered mural thrombus in the LA.  He was switched over to warfarin, which he continues.  He has been back in atrial fibrillation recently, which may have triggered CHF exacerbation.  - Continue warfarin.  - Start amiodarone 200 mg bid x 2 wks then 200 mg daily to  try to maintain NSR.  - I will arrange for TEE-guided DCCV if he remains in atrial fibrillation in about a week.  Will get INR day of procedure. I discussed risks/benefits with patient and he agrees to procedure.  - Stop metoprolol tartrate, start Toprol XL 25 mg daily.  3. Mitral valve disorder: History of mitral regurgitation, now s/p minimally invasive MV repair.   - Reassess with TEE as above.   4. CKD: Stage IV.  Follows with Dr. Justin Mend. Gradually worsening renal function. Follow closely with diuresis.  5. TIA: I suspect Mr Vallier had another TIA in 1/18 with transient left-sided weakness.  TEE in 10/17 showed layered LA thrombus.  He had been on Eliquis => transitioned to warfarin.  He has seen neurology.  - He will continue warfarin.  As above, bridge with Lovenox if it is stopped. Check CBC.  6. Hyperlipidemia: Will need to check lipids next appt.   Followup in 3 wks with NP/PA.   [name] 02/14/2020

## 2020-02-15 ENCOUNTER — Other Ambulatory Visit (HOSPITAL_COMMUNITY)
Admission: RE | Admit: 2020-02-15 | Discharge: 2020-02-15 | Disposition: A | Discharge status: Home / Self Care | Payer: Medicare HMO | Source: Ambulatory Visit | Attending: Cardiology | Admitting: Cardiology

## 2020-02-15 DIAGNOSIS — Z01812 Encounter for preprocedural laboratory examination: Secondary | ICD-10-CM | POA: Insufficient documentation

## 2020-02-15 DIAGNOSIS — Z20822 Contact with and (suspected) exposure to covid-19: Secondary | ICD-10-CM | POA: Diagnosis not present

## 2020-02-15 LAB — SARS CORONAVIRUS 2 (TAT 6-24 HRS): SARS Coronavirus 2: NEGATIVE

## 2020-02-18 ENCOUNTER — Other Ambulatory Visit: Payer: Self-pay

## 2020-02-18 ENCOUNTER — Encounter (HOSPITAL_COMMUNITY)
Admission: RE | Disposition: A | Discharge status: Home / Self Care | Payer: Self-pay | Source: Home / Self Care | Attending: Cardiology

## 2020-02-18 ENCOUNTER — Ambulatory Visit (HOSPITAL_COMMUNITY): Payer: Medicare HMO | Admitting: Anesthesiology

## 2020-02-18 ENCOUNTER — Ambulatory Visit (HOSPITAL_BASED_OUTPATIENT_CLINIC_OR_DEPARTMENT_OTHER)
Admission: RE | Admit: 2020-02-18 | Discharge: 2020-02-18 | Disposition: A | Discharge status: Home / Self Care | Payer: Medicare HMO | Source: Ambulatory Visit | Attending: Cardiology | Admitting: Cardiology

## 2020-02-18 ENCOUNTER — Ambulatory Visit (HOSPITAL_COMMUNITY)
Admission: RE | Admit: 2020-02-18 | Discharge: 2020-02-18 | Disposition: A | Discharge status: Home / Self Care | Payer: Medicare HMO | Attending: Cardiology | Admitting: Cardiology

## 2020-02-18 DIAGNOSIS — N184 Chronic kidney disease, stage 4 (severe): Secondary | ICD-10-CM | POA: Insufficient documentation

## 2020-02-18 DIAGNOSIS — I342 Nonrheumatic mitral (valve) stenosis: Secondary | ICD-10-CM

## 2020-02-18 DIAGNOSIS — I2510 Atherosclerotic heart disease of native coronary artery without angina pectoris: Secondary | ICD-10-CM | POA: Insufficient documentation

## 2020-02-18 DIAGNOSIS — I130 Hypertensive heart and chronic kidney disease with heart failure and stage 1 through stage 4 chronic kidney disease, or unspecified chronic kidney disease: Secondary | ICD-10-CM | POA: Diagnosis not present

## 2020-02-18 DIAGNOSIS — I420 Dilated cardiomyopathy: Secondary | ICD-10-CM | POA: Diagnosis not present

## 2020-02-18 DIAGNOSIS — I480 Paroxysmal atrial fibrillation: Secondary | ICD-10-CM | POA: Diagnosis present

## 2020-02-18 DIAGNOSIS — Z8673 Personal history of transient ischemic attack (TIA), and cerebral infarction without residual deficits: Secondary | ICD-10-CM | POA: Diagnosis not present

## 2020-02-18 DIAGNOSIS — Z8249 Family history of ischemic heart disease and other diseases of the circulatory system: Secondary | ICD-10-CM | POA: Insufficient documentation

## 2020-02-18 DIAGNOSIS — I4891 Unspecified atrial fibrillation: Secondary | ICD-10-CM

## 2020-02-18 DIAGNOSIS — I340 Nonrheumatic mitral (valve) insufficiency: Secondary | ICD-10-CM

## 2020-02-18 DIAGNOSIS — I5032 Chronic diastolic (congestive) heart failure: Secondary | ICD-10-CM | POA: Diagnosis not present

## 2020-02-18 DIAGNOSIS — E785 Hyperlipidemia, unspecified: Secondary | ICD-10-CM | POA: Insufficient documentation

## 2020-02-18 DIAGNOSIS — I083 Combined rheumatic disorders of mitral, aortic and tricuspid valves: Secondary | ICD-10-CM | POA: Insufficient documentation

## 2020-02-18 DIAGNOSIS — Z7901 Long term (current) use of anticoagulants: Secondary | ICD-10-CM | POA: Insufficient documentation

## 2020-02-18 DIAGNOSIS — Z79899 Other long term (current) drug therapy: Secondary | ICD-10-CM | POA: Insufficient documentation

## 2020-02-18 DIAGNOSIS — K219 Gastro-esophageal reflux disease without esophagitis: Secondary | ICD-10-CM | POA: Diagnosis not present

## 2020-02-18 HISTORY — PX: TEE WITHOUT CARDIOVERSION: SHX5443

## 2020-02-18 HISTORY — PX: CARDIOVERSION: SHX1299

## 2020-02-18 LAB — POCT I-STAT, CHEM 8
BUN: 39 mg/dL — ABNORMAL HIGH (ref 8–23)
Calcium, Ion: 1.21 mmol/L (ref 1.15–1.40)
Chloride: 103 mmol/L (ref 98–111)
Creatinine, Ser: 3.6 mg/dL — ABNORMAL HIGH (ref 0.61–1.24)
Glucose, Bld: 81 mg/dL (ref 70–99)
HCT: 47 % (ref 39.0–52.0)
Hemoglobin: 16 g/dL (ref 13.0–17.0)
Potassium: 3.8 mmol/L (ref 3.5–5.1)
Sodium: 142 mmol/L (ref 135–145)
TCO2: 29 mmol/L (ref 22–32)

## 2020-02-18 LAB — PROTIME-INR
INR: 2.4 — ABNORMAL HIGH (ref 0.8–1.2)
Prothrombin Time: 25.6 seconds — ABNORMAL HIGH (ref 11.4–15.2)

## 2020-02-18 SURGERY — ECHOCARDIOGRAM, TRANSESOPHAGEAL
Anesthesia: General

## 2020-02-18 MED ORDER — PROPOFOL 10 MG/ML IV BOLUS
INTRAVENOUS | Status: DC | PRN
  Administered 2020-02-18: 30 mg via INTRAVENOUS
  Administered 2020-02-18 (×3): 20 mg via INTRAVENOUS

## 2020-02-18 MED ORDER — PROPOFOL 500 MG/50ML IV EMUL
INTRAVENOUS | Status: DC | PRN
  Administered 2020-02-18: 100 ug/kg/min via INTRAVENOUS

## 2020-02-18 MED ORDER — EPHEDRINE SULFATE 50 MG/ML IJ SOLN
INTRAMUSCULAR | Status: DC | PRN
  Administered 2020-02-18: 10 mg via INTRAVENOUS

## 2020-02-18 MED ORDER — SODIUM CHLORIDE 0.9 % IV SOLN: INTRAVENOUS | Status: DC

## 2020-02-18 NOTE — Anesthesia Preprocedure Evaluation (Signed)
Anesthesia Evaluation  Patient identified by MRN, date of birth, ID band Patient awake    Reviewed: Allergy & Precautions, H&P , NPO status , Patient's Chart, lab work & pertinent test results  Airway Mallampati: II  TM Distance: >3 FB Neck ROM: Full    Dental no notable dental hx. (+) Teeth Intact, Dental Advisory Given   Pulmonary neg pulmonary ROS, former smoker,    Pulmonary exam normal breath sounds clear to auscultation       Cardiovascular hypertension, Pt. on medications and Pt. on home beta blockers + CAD  + dysrhythmias Atrial Fibrillation + Valvular Problems/Murmurs  Rhythm:Irregular Rate:Normal     Neuro/Psych negative neurological ROS  negative psych ROS   GI/Hepatic Neg liver ROS, GERD  ,  Endo/Other  negative endocrine ROS  Renal/GU Renal disease  negative genitourinary   Musculoskeletal  (+) Arthritis , Osteoarthritis,    Abdominal   Peds  Hematology negative hematology ROS (+)   Anesthesia Other Findings   Reproductive/Obstetrics negative OB ROS                             Anesthesia Physical Anesthesia Plan  ASA: III  Anesthesia Plan: General   Post-op Pain Management:    Induction: Intravenous  PONV Risk Score and Plan: 2 and Propofol infusion and Treatment may vary due to age or medical condition  Airway Management Planned: Nasal Cannula  Additional Equipment:   Intra-op Plan:   Post-operative Plan:   Informed Consent: I have reviewed the patients History and Physical, chart, labs and discussed the procedure including the risks, benefits and alternatives for the proposed anesthesia with the patient or authorized representative who has indicated his/her understanding and acceptance.     Dental advisory given  Plan Discussed with: CRNA  Anesthesia Plan Comments:         Anesthesia Quick Evaluation

## 2020-02-18 NOTE — Discharge Instructions (Signed)

## 2020-02-18 NOTE — Anesthesia Postprocedure Evaluation (Signed)
Anesthesia Post Note  Patient: Daniel Mayo  Procedure(s) Performed: TRANSESOPHAGEAL ECHOCARDIOGRAM (TEE) (N/A ) CARDIOVERSION (N/A )     Patient location during evaluation: Endoscopy Anesthesia Type: General Level of consciousness: awake and alert Pain management: pain level controlled Vital Signs Assessment: post-procedure vital signs reviewed and stable Respiratory status: spontaneous breathing, nonlabored ventilation and respiratory function stable Cardiovascular status: blood pressure returned to baseline and stable Postop Assessment: no apparent nausea or vomiting Anesthetic complications: no    Last Vitals:  Vitals:   02/18/20 1220 02/18/20 1230  BP: 132/72 132/72  Pulse: (!) 53 (!) 52  Resp: 17 18  Temp:    SpO2: 97% 98%    Last Pain:  Vitals:   02/18/20 1230  TempSrc:   PainSc: 0-No pain                 Barry Culverhouse,W. EDMOND

## 2020-02-18 NOTE — Anesthesia Procedure Notes (Signed)
Procedure Name: MAC Date/Time: 02/18/2020 11:10 AM Performed by: Amadeo Garnet, CRNA Pre-anesthesia Checklist: Patient identified, Emergency Drugs available, Suction available and Patient being monitored Patient Re-evaluated:Patient Re-evaluated prior to induction Oxygen Delivery Method: Nasal cannula Preoxygenation: Pre-oxygenation with 100% oxygen Induction Type: IV induction Placement Confirmation: positive ETCO2 Dental Injury: Teeth and Oropharynx as per pre-operative assessment

## 2020-02-18 NOTE — Procedures (Signed)
Electrical Cardioversion Procedure Note Daniel Mayo [id number] [birth year]  Procedure: Electrical Cardioversion Indications:  Atrial Fibrillation  Procedure Details Consent: Risks of procedure as well as the alternatives and risks of each were explained to the (patient/caregiver).  Consent for procedure obtained. Time Out: Verified patient identification, verified procedure, site/side was marked, verified correct patient position, special equipment/implants available, medications/allergies/relevent history reviewed, required imaging and test results available.  Performed  Patient placed on cardiac monitor, pulse oximetry, supplemental oxygen as necessary.  Sedation given: Propofol per anesthesiology Pacer pads placed anterior and posterior chest.  Cardioverted 1 time(s).  Cardioverted at [place].  Evaluation Findings: Post procedure EKG shows: NSR Complications: None Patient did tolerate procedure well.   Daniel Mayo 02/18/2020, 11:41 AM

## 2020-02-18 NOTE — Transfer of Care (Signed)
Immediate Anesthesia Transfer of Care Note  Patient: Daniel Mayo  Procedure(s) Performed: TRANSESOPHAGEAL ECHOCARDIOGRAM (TEE) (N/A ) CARDIOVERSION (N/A )  Patient Location: Endoscopy Unit  Anesthesia Type:MAC  Level of Consciousness: drowsy  Airway & Oxygen Therapy: Patient Spontanous Breathing and Patient connected to nasal cannula oxygen  Post-op Assessment: Report given to RN, Post -op Vital signs reviewed and stable and Patient moving all extremities  Post vital signs: Reviewed and stable  Last Vitals:  Vitals Value Taken Time  BP    Temp    Pulse    Resp    SpO2      Last Pain:  Vitals:   02/18/20 0959  TempSrc: Oral  PainSc: 0-No pain         Complications: No apparent anesthesia complications

## 2020-02-18 NOTE — Interval H&P Note (Signed)
History and Physical Interval Note:  02/18/2020 11:13 AM  Daniel Mayo  has presented today for surgery, with the diagnosis of AFIB.  The various methods of treatment have been discussed with the patient and family. After consideration of risks, benefits and other options for treatment, the patient has consented to  Procedure(s): TRANSESOPHAGEAL ECHOCARDIOGRAM (TEE) (N/A) CARDIOVERSION (N/A) as a surgical intervention.  The patient's history has been reviewed, patient examined, no change in status, stable for surgery.  I have reviewed the patient's chart and labs.  Questions were answered to the patient's satisfaction.     Liyla Radliff [employer]

## 2020-02-18 NOTE — CV Procedure (Signed)
Procedure: TEE  Sedation: Per anesthesiology  Indication: Atrial fibrillation.  Findings: Please see echo section for full report.  No LA appendage thrombus noted, proceeding to DCCV.  [name] 02/18/2020 11:41 AM

## 2020-02-25 ENCOUNTER — Ambulatory Visit (HOSPITAL_COMMUNITY)
Admission: RE | Admit: 2020-02-25 | Discharge: 2020-02-25 | Disposition: A | Discharge status: Home / Self Care | Payer: Medicare HMO | Source: Ambulatory Visit | Attending: Internal Medicine | Admitting: Internal Medicine

## 2020-02-25 ENCOUNTER — Other Ambulatory Visit: Payer: Self-pay

## 2020-02-25 DIAGNOSIS — I4891 Unspecified atrial fibrillation: Secondary | ICD-10-CM | POA: Diagnosis not present

## 2020-02-25 LAB — BASIC METABOLIC PANEL
Anion gap: 12 (ref 5–15)
BUN: 31 mg/dL — ABNORMAL HIGH (ref 8–23)
CO2: 26 mmol/L (ref 22–32)
Calcium: 9.3 mg/dL (ref 8.9–10.3)
Chloride: 104 mmol/L (ref 98–111)
Creatinine, Ser: 3.2 mg/dL — ABNORMAL HIGH (ref 0.61–1.24)
GFR calc Af Amer: 20 mL/min — ABNORMAL LOW (ref >60)
GFR calc non Af Amer: 17 mL/min — ABNORMAL LOW (ref >60)
Glucose, Bld: 102 mg/dL — ABNORMAL HIGH (ref 70–99)
Potassium: 3.7 mmol/L (ref 3.5–5.1)
Sodium: 142 mmol/L (ref 135–145)

## 2020-02-27 ENCOUNTER — Encounter (HOSPITAL_COMMUNITY): Payer: Self-pay | Admitting: Cardiology

## 2020-02-27 ENCOUNTER — Ambulatory Visit (HOSPITAL_COMMUNITY)
Admission: RE | Admit: 2020-02-27 | Discharge: 2020-02-27 | Disposition: A | Discharge status: Home / Self Care | Payer: Medicare HMO | Source: Ambulatory Visit | Attending: Cardiology | Admitting: Cardiology

## 2020-02-27 ENCOUNTER — Other Ambulatory Visit: Payer: Self-pay

## 2020-02-27 VITALS — BP 100/80 | HR 84 | Wt 221.0 lb

## 2020-02-27 DIAGNOSIS — I4819 Other persistent atrial fibrillation: Secondary | ICD-10-CM | POA: Insufficient documentation

## 2020-02-27 DIAGNOSIS — I50810 Right heart failure, unspecified: Secondary | ICD-10-CM

## 2020-02-27 DIAGNOSIS — I480 Paroxysmal atrial fibrillation: Secondary | ICD-10-CM

## 2020-02-27 DIAGNOSIS — I4891 Unspecified atrial fibrillation: Secondary | ICD-10-CM

## 2020-02-27 DIAGNOSIS — E785 Hyperlipidemia, unspecified: Secondary | ICD-10-CM | POA: Diagnosis not present

## 2020-02-27 DIAGNOSIS — Z8673 Personal history of transient ischemic attack (TIA), and cerebral infarction without residual deficits: Secondary | ICD-10-CM | POA: Diagnosis not present

## 2020-02-27 DIAGNOSIS — I081 Rheumatic disorders of both mitral and tricuspid valves: Secondary | ICD-10-CM | POA: Diagnosis not present

## 2020-02-27 DIAGNOSIS — I5032 Chronic diastolic (congestive) heart failure: Secondary | ICD-10-CM | POA: Diagnosis not present

## 2020-02-27 DIAGNOSIS — I5042 Chronic combined systolic (congestive) and diastolic (congestive) heart failure: Secondary | ICD-10-CM | POA: Diagnosis present

## 2020-02-27 DIAGNOSIS — I2510 Atherosclerotic heart disease of native coronary artery without angina pectoris: Secondary | ICD-10-CM | POA: Insufficient documentation

## 2020-02-27 DIAGNOSIS — Z8249 Family history of ischemic heart disease and other diseases of the circulatory system: Secondary | ICD-10-CM | POA: Diagnosis not present

## 2020-02-27 DIAGNOSIS — Z79899 Other long term (current) drug therapy: Secondary | ICD-10-CM | POA: Insufficient documentation

## 2020-02-27 DIAGNOSIS — Z7901 Long term (current) use of anticoagulants: Secondary | ICD-10-CM | POA: Insufficient documentation

## 2020-02-27 DIAGNOSIS — N184 Chronic kidney disease, stage 4 (severe): Secondary | ICD-10-CM | POA: Diagnosis not present

## 2020-02-27 DIAGNOSIS — I130 Hypertensive heart and chronic kidney disease with heart failure and stage 1 through stage 4 chronic kidney disease, or unspecified chronic kidney disease: Secondary | ICD-10-CM | POA: Insufficient documentation

## 2020-02-27 DIAGNOSIS — K219 Gastro-esophageal reflux disease without esophagitis: Secondary | ICD-10-CM | POA: Insufficient documentation

## 2020-02-27 MED ORDER — FUROSEMIDE 40 MG PO TABS: 40.0000 mg | ORAL_TABLET | Freq: Two times a day (BID) | ORAL | 5 refills | Status: AC

## 2020-02-27 NOTE — Patient Instructions (Addendum)
INCREASE Furosemide (Lasix) to 40mg  (1 tab) twice a day   Lab in 10 days We will only contact you if something comes back abnormal or we need to make some changes. Otherwise no news is good news!   Your physician recommends that you schedule a follow-up appointment in: 3 weeks with the Nurse Practitioner or Physician Assistant   Please call office at [phone] option 2 if you have any questions or concerns.   At the [facility], you and your health needs are our priority. As part of our continuing mission to provide you with exceptional heart care, we have created designated Provider Care Teams. These Care Teams include your primary Cardiologist (physician) and Advanced Practice Providers (APPs- Physician Assistants and Nurse Practitioners) who all work together to provide you with the care you need, when you need it.   You may see any of the following providers on your designated Care Team at your next follow up: Marland Kitchen Dr Glori Bickers . Dr [name] . Darrick Grinder, NP . Lyda Jester, PA . Audry Riles, PharmD   Please be sure to bring in all your medications bottles to every appointment.

## 2020-02-27 NOTE — Progress Notes (Signed)
PCP: Lennice Sites HF Cardiology: Dr. Aundra Dubin  85 y.o. with history of paroxysmal atrial fibrillation and severe mitral regurgitation s/p minimally invasive MV repair and Maze in 11/14 returns for followup of valvular disease and dyspnea.  He developed increased exertional dyspnea over the 6 months prior to the initial evaluation in this office.  He was short of breath walking a short distance or doing housework like vacuuming.  He also had had a TIA  and was started on apixaban 2.5 mg bid (he had not been anticoagulated since around the time of his prior surgery). He was sent back to Dr Roxy Manns for re-evaluation, there was concern for stenosis of the repaired mitral valve.  He had a TEE in 10/17 showing EF 45-50% range with moderate to moderate-severe tricuspid regurgitation, mildly decreased RV systolic function, and possibly moderate stenosis of the repaired mitral valve (mild to moderate regurgitation).  Most significantly, there was a layered thrombus along the left atrial wall.  I increased his dose of apixaban to 5 mg bid at that time. He was seen in followup by Dr. Roxy Manns, no plan for redo surgery.  Event monitor in 10/17 showed no atrial fibrillation.  Cardiolite was done in 2/18. This showed basal-mid inferolateral fixed defect, but no ischemia, EF 55%. He was transitioned from Eliquis to warfarin given valvular disease.    Echo was done in 10/18 showing EF 55% with mildly decreased RV systolic function; s/p MV repair with mild stenosis and trivial MR.  Most recent echo in 5/21 done at the hospital in [facility] showed EF 50-55%, mild LVH, severely dilated RV, s/p MV repair with mild MR, dilated IVC.   Patient was admitted to [facility] in 5/21 with atrial fibrillation and CHF.  He had been short of breath for 2-3 months.  He was diuresed with IV Lasix and sent home, no DCCV.   I set him up for TEE-guided DCCV.  TEE in 5/21 showed EF 45-50% with diffuse hypokinesis, severely dilated RV with moderately  decreased RV systolic function, severe TR with flail segment, stable repaired mitral valve.  He had DCCV to NSR.   He returns today for followup of diastolic CHF and atrial fibrillation.  He is back in atrial fibrillation again today despite amiodarone use.  He says that his breathing is overall better, able to sleep in his bed though still propping up due to orthopnea.  Short of breath after 100 yards on flat ground or 50 yards with incline. No chest pain. No palpitations.       ECG (personally reviewed): Atrial fibrillation, RBBB  Labs (10/17): K 4.8, creatinine 2.08, BNP 43, hgb 15.6 Labs (12/17): K 4.5, creatinine 2.36, HCT 46.3, plts 105 Labs (1/18): K 4.1, creatinine 2.5, LDL 104, BNP 175 Labs (2/18): K 4, creatinine 2.65, BNP 65, HCT 44.2 Labs (3/18): LDL 70, HDL 38 Labs (7/18): K 4.4, creatinine 2.66 Labs (11/18): K 4, creatinine 2.83 Labs (1/19): LDL 53, HDL 42 Labs (11/19): creatinine 2.6 Labs (5/21): K 3.9, creatinine 3.09 Labs (6/21): K 3.7, creatinine 3.2, hgb 14.7  PMH: 1. Mitral regurgitation: s/p minimally invasive mitral valve repair in 11/14 (Dr Roxy Manns).  He has developed stenosis of the repaired valve.  - TEE (10/17) with EF 45-50%, mild diffuse hypokinesis, mildly dilated RV with mildly decreased systolic function, moderate to moderate-severe tricuspid regurgitation, peak RV-RA gradient 31 mmHg, mild AI, mitral valve s/p repair with mild-moderate MR, posterior leaflet relatively fixed with mean gradient 6 mmHg and MVA 1.22  cm^2 => possible moderate mitral stenosis; there was layered thrombus along the wall of the left atrium.  - Echo (12/17, [place] => images not available): Per report, EF 40-45%, mild LVH, severe LAE, s/p MV repair with "moderate mitral stenosis" but MVA 1.7 cm^2 via PHT, mild MR, moderate TR, PASP 45 mmHg.  - Echo (10/18): EF 55%, mild RV dilation with mildly decreased systolic function, s/p MV repair with mild mitral stenosis (mean gradient 5 mmHg and MVA  1.5 cm^2), trivial MR, trivial TR.  - Echo (11/19): EF 55-60%, s/p MV repair with moderate functional stenosis and mild MR, severe LAE, moderate TR.   - Echo (5/21, [facility]): EF 50-55%, mild LVH, severe RV dilation with normal systolic function, s/p MV repair with mild MR, dilated IVC.  2. TIA: MRI head 10/17 did not show CVA.  - 1/18 carotid dopplers: No significant stenosis.  3. Atrial fibrillation: Paroxysmal.  S/p Maze procedure with MV repair in 11/14.  - Event monitor (10/17): No atrial fibrillation - DCCV to NSR in 5/21 but back in atrial fibrillation by 6/21 appt.  4. CKD stage IV 5. GERD 6. HTN 7. CAD: LHC (2014) with 70% proximal stenosis in a small high diagonal, otherwise no significant disease.  - Cardiolite (6/17): No ischemia or infarction.  - Cardiolite (2/18): EF 55%, no ischemia, fixed basal to mid inferolateral perfusion defect (low risk).  8. Chronic thrombocytopenia 9. Primarily diastolic CHF with RV failure: In setting of severe TR.  - TEE (5/21): EF 45-50%, diffuse hypokinesis, severe RV dilation with moderately decreased systolic function, D-shaped interventricular septum, severe biatrial enlargement, flail tricuspid valve segment with severe TR, s/p MV repair with mean gradient 4 and mild MR, dilated IVC.   SH: Lives in [place], not married but has girlfriend, 1-2 glasses wine/night, nonsmoker.   Family History  Problem Relation Age of Onset  . Diabetes Other   . Hypertension Other   . Coronary artery disease Other    ROS: All systems reviewed and negative except as per HPI.  Current Outpatient Medications  Medication Sig Dispense Refill  . amiodarone (PACERONE) 200 MG tablet Take 1 tab twice a day for two weeks then 1 tab daily after that (Patient taking differently: Take 200 mg by mouth See admin instructions. Take 200 mg for two weeks then 200 mg daily after that) 45 tablet 3  . cholecalciferol (VITAMIN D3) 25 MCG (1000 UNIT) tablet Take 1,000  Units by mouth daily.    . furosemide (LASIX) 40 MG tablet Take 1 tablet (40 mg total) by mouth 2 (two) times daily. 60 tablet 5  . metoprolol succinate (TOPROL-XL) 25 MG 24 hr tablet Take 1 tablet (25 mg total) by mouth daily. Take with or immediately following a meal. 90 tablet 3  . Multiple Vitamins-Minerals (MULTIVITAL-M PO) Take 1 tablet by mouth daily.     . nitroGLYCERIN (NITROSTAT) 0.4 MG SL tablet Place 0.4 mg under the tongue every 5 (five) minutes as needed for chest pain.    . NON FORMULARY Take 1 capsule by mouth daily. NEURONIX Advanced Nerve Support    . simvastatin (ZOCOR) 40 MG tablet Take 40 mg by mouth daily.     Marland Kitchen warfarin (COUMADIN) 5 MG tablet Take 5 mg by mouth daily.      No current facility-administered medications for this encounter.   BP 100/80   Pulse 84   Wt 100.2 kg (221 lb)   SpO2 96%   BMI 28.37 kg/m  General: NAD Neck: JVP 9-10 cm, no thyromegaly or thyroid nodule.  Lungs: Clear to auscultation bilaterally with normal respiratory effort. CV: Nondisplaced PMI.  Heart irregular S1/S2, no S3/S4, 2/6 HSM LLSB.  Trace ankle edema.  No carotid bruit.  Normal pedal pulses.  Abdomen: Soft, nontender, no hepatosplenomegaly, no distention.  Skin: Intact without lesions or rashes.  Neurologic: Alert and oriented x 3.  Psych: Normal affect. Extremities: No clubbing or cyanosis.  HEENT: Normal.   Assessment/Plan: 1. Chronic diastolic CHF with RV failure: TEE in 5/21 showed EF 45-50% (chronic) but with severely dilated RV and moderately decreased RV systolic function with D-shaped septum, severe TR with flail segment.  PA pressure estimate was not significantly elevated.  This looks like primary tricuspid valve disease with causing RV failure (?old endocarditis damaging the leaflet - no evidence for active endocarditis).  He is back in atrial fibrillation today.  Suspect atrial fibrillation and RV failure are the primary causes of volume overload/CHF. NYHA class 3  symptoms, he does appear volume overloaded. .  - He is still only taking Lasix 40 mg daily. Increase Lasix to 40 mg po bid. BMET in 10 days.  2. Atrial fibrillation: Persistent.  Had Maze in 11/14.  He was off anticoagulation until a TIA and was started on apixaban 2.5 mg bid.  TEE while on the low dose apixaban showed a layered mural thrombus in the LA.  He was switched over to warfarin, which he continues.  He has been back in atrial fibrillation recently, which may have triggered CHF exacerbation. DCCV was done in 5/21 on amiodarone, but he is back in atrial fibrillation today. He is unsure how long he has been in afib again.  - Continue warfarin.  - Continue amiodarone for now and will have him see NP/PA in 3 wks.  If he is still in atrial fibrillation, would arrange for 1 more DCCV while on amiodarone.  If this does not hold, would stop amiodarone and rate control him.  With severely enlarged atria and severe TR, I suspect we will not be able to keep him in NSR. Check LFTs and TSH today, if he continues amiodarone he will need a regular eye exam.  - Continue Toprol XL 25 mg daily.  3. Mitral valve disorder: History of mitral regurgitation, now s/p minimally invasive MV repair.  MV was stable on 5/21 TEE.   4. CKD: Stage IV.  Follows with Dr. Justin Mend. Gradually worsening renal function. Follow closely with diuresis.  5. TIA: I suspect Mr Bertone had another TIA in 1/18 with transient left-sided weakness.  TEE in 10/17 showed layered LA thrombus.  He had been on Eliquis => transitioned to warfarin.  He has seen neurology.  - He will continue warfarin.  As above, bridge with Lovenox if it is stopped. Check CBC.  6. Hyperlipidemia: Check lipids today.  7. Tricuspid regurgitation: Severe TR, this looks like a primary problem with partial flail septal leaflet.  ?Prior endocarditis (no evidence for active infection).  By TEE, estimated PA pressure is only 34 mmHg, this supports a primary TV problem.   I  reviewed his TEE with Dr. Roxy Manns, we agree that surgical TV repair would not be a good idea.  I contacted Dr. Bridgette Habermann at [facility] regarding percutaneous TV repair trials.  However, his elevated creatinine excludes him from trials. - Manage medically.   Followup in 3 wks with NP/PA.   [name] 02/27/2020

## 2020-03-09 ENCOUNTER — Other Ambulatory Visit: Payer: Self-pay

## 2020-03-09 ENCOUNTER — Other Ambulatory Visit (HOSPITAL_COMMUNITY): Payer: Self-pay | Admitting: *Deleted

## 2020-03-09 ENCOUNTER — Ambulatory Visit (HOSPITAL_COMMUNITY)
Admission: RE | Admit: 2020-03-09 | Discharge: 2020-03-09 | Disposition: A | Discharge status: Home / Self Care | Payer: Medicare HMO | Source: Ambulatory Visit | Attending: Internal Medicine | Admitting: Internal Medicine

## 2020-03-09 DIAGNOSIS — I5032 Chronic diastolic (congestive) heart failure: Secondary | ICD-10-CM | POA: Diagnosis not present

## 2020-03-09 DIAGNOSIS — I4891 Unspecified atrial fibrillation: Secondary | ICD-10-CM | POA: Diagnosis present

## 2020-03-09 LAB — COMPREHENSIVE METABOLIC PANEL
ALT: 22 U/L (ref 0–44)
AST: 33 U/L (ref 15–41)
Albumin: 3.6 g/dL (ref 3.5–5.0)
Alkaline Phosphatase: 73 U/L (ref 38–126)
Anion gap: 11 (ref 5–15)
BUN: 32 mg/dL — ABNORMAL HIGH (ref 8–23)
CO2: 29 mmol/L (ref 22–32)
Calcium: 9.5 mg/dL (ref 8.9–10.3)
Chloride: 102 mmol/L (ref 98–111)
Creatinine, Ser: 3.01 mg/dL — ABNORMAL HIGH (ref 0.61–1.24)
GFR calc Af Amer: 21 mL/min — ABNORMAL LOW (ref >60)
GFR calc non Af Amer: 18 mL/min — ABNORMAL LOW (ref >60)
Glucose, Bld: 101 mg/dL — ABNORMAL HIGH (ref 70–99)
Potassium: 3.6 mmol/L (ref 3.5–5.1)
Sodium: 142 mmol/L (ref 135–145)
Total Bilirubin: 1.2 mg/dL (ref 0.3–1.2)
Total Protein: 7 g/dL (ref 6.5–8.1)

## 2020-03-09 LAB — PROTIME-INR
INR: 3.2 — ABNORMAL HIGH (ref 0.8–1.2)
Prothrombin Time: 31.7 seconds — ABNORMAL HIGH (ref 11.4–15.2)

## 2020-03-09 LAB — LIPID PANEL
Cholesterol: 118 mg/dL (ref 0–200)
HDL: 40 mg/dL — ABNORMAL LOW (ref >40)
LDL Cholesterol: 62 mg/dL (ref 0–99)
Total CHOL/HDL Ratio: 3 RATIO
Triglycerides: 81 mg/dL (ref <150)
VLDL: 16 mg/dL (ref 0–40)

## 2020-03-09 LAB — TSH: TSH: 2.863 u[IU]/mL (ref 0.350–4.500)

## 2020-03-16 ENCOUNTER — Encounter (HOSPITAL_COMMUNITY): Payer: Medicare HMO

## 2020-03-19 ENCOUNTER — Encounter (HOSPITAL_COMMUNITY): Payer: Medicare HMO

## 2020-05-07 ENCOUNTER — Other Ambulatory Visit (HOSPITAL_COMMUNITY): Payer: Self-pay | Admitting: Cardiology

## 2020-05-18 ENCOUNTER — Encounter (HOSPITAL_COMMUNITY): Payer: Medicare HMO

## 2020-06-03 ENCOUNTER — Other Ambulatory Visit (HOSPITAL_COMMUNITY): Payer: Self-pay | Admitting: Cardiology

## 2020-06-30 ENCOUNTER — Other Ambulatory Visit (HOSPITAL_COMMUNITY): Payer: Self-pay | Admitting: Cardiology

## 2020-07-28 ENCOUNTER — Other Ambulatory Visit (HOSPITAL_COMMUNITY): Payer: Self-pay | Admitting: Cardiology

## 2020-08-26 ENCOUNTER — Other Ambulatory Visit (HOSPITAL_COMMUNITY): Payer: Self-pay | Admitting: Cardiology

## 2020-09-21 ENCOUNTER — Other Ambulatory Visit (HOSPITAL_COMMUNITY): Payer: Self-pay | Admitting: Cardiology

## 2020-09-26 ENCOUNTER — Encounter (HOSPITAL_BASED_OUTPATIENT_CLINIC_OR_DEPARTMENT_OTHER): Payer: Self-pay | Admitting: Emergency Medicine

## 2020-09-26 ENCOUNTER — Emergency Department (HOSPITAL_BASED_OUTPATIENT_CLINIC_OR_DEPARTMENT_OTHER)
Admission: EM | Admit: 2020-09-26 | Discharge: 2020-09-27 | Disposition: A | Discharge status: Home / Self Care | Payer: Medicare HMO | Attending: Emergency Medicine | Admitting: Emergency Medicine

## 2020-09-26 DIAGNOSIS — Z87891 Personal history of nicotine dependence: Secondary | ICD-10-CM | POA: Insufficient documentation

## 2020-09-26 DIAGNOSIS — L720 Epidermal cyst: Secondary | ICD-10-CM | POA: Insufficient documentation

## 2020-09-26 DIAGNOSIS — N189 Chronic kidney disease, unspecified: Secondary | ICD-10-CM | POA: Insufficient documentation

## 2020-09-26 DIAGNOSIS — L723 Sebaceous cyst: Secondary | ICD-10-CM | POA: Diagnosis present

## 2020-09-26 DIAGNOSIS — L089 Local infection of the skin and subcutaneous tissue, unspecified: Secondary | ICD-10-CM | POA: Insufficient documentation

## 2020-09-26 DIAGNOSIS — I4819 Other persistent atrial fibrillation: Secondary | ICD-10-CM | POA: Insufficient documentation

## 2020-09-26 DIAGNOSIS — I129 Hypertensive chronic kidney disease with stage 1 through stage 4 chronic kidney disease, or unspecified chronic kidney disease: Secondary | ICD-10-CM | POA: Diagnosis not present

## 2020-09-26 DIAGNOSIS — Z7901 Long term (current) use of anticoagulants: Secondary | ICD-10-CM | POA: Diagnosis not present

## 2020-09-26 DIAGNOSIS — Z79899 Other long term (current) drug therapy: Secondary | ICD-10-CM | POA: Insufficient documentation

## 2020-09-26 MED ORDER — LIDOCAINE-EPINEPHRINE 1 %-1:100000 IJ SOLN
INTRAMUSCULAR | Status: AC
  Administered 2020-09-27: 30 mL via INTRADERMAL
  Filled 2020-09-26: qty 1

## 2020-09-26 NOTE — ED Notes (Signed)
Pt's abscess to L axilla marked with surgical marker.

## 2020-09-26 NOTE — ED Provider Notes (Signed)
[facility] Provider Note: Daniel Spurling, MD, FACEP  CSN: [id number] MRN: [id number] ARRIVAL: 09/26/20 at 1644 ROOM: [place]  Abscess   HISTORY OF PRESENT ILLNESS  09/26/20 11:46 PM Daniel Mayo is a 86 y.o. male who has had a "knot" in his left axilla for well over a year.  It had not been painful or erythematous until 2 days ago.  It became red, painful and he was seen in urgent care who diagnosed him with an abscess and started him on Keflex.  Despite being started on Keflex surrounding erythema has increased as well as the pain over the subsequent 24 hours.  He rates the pain currently as an 8 out of 10, worse with movement or palpation.  He has not had a fever or chills.   Past Medical History:  Diagnosis Date  . AF (atrial fibrillation) ([place])   . Allergic rhinitis   . Arthritis   . Atrial fibrillation ([place]) 02/17/2010   Persistent atrial fibrillation    . Chronic back pain   . Chronic kidney disease (CKD)   . CKD (chronic kidney disease)   . Coronary artery disease   . Dyspnea   . Dysrhythmia    history  of afib- not current- 09/11/18  . Elevated PSA   . Erectile dysfunction   . GERD (gastroesophageal reflux disease)   . HOH (hard of hearing)   . Hyperlipidemia   . Hypertension   . Hypogonadism male   . Insomnia   . Left ventricular hypertrophy   . Mitral regurgitation 07/12/2013  . Mitral regurgitation   . Mitral stenosis   . Neuropathy   . Pneumonia    as a child  . Renal disorder   . S/P Maze operation for atrial fibrillation 08/14/2013   Complete bilateral atrial lesion set using cryothermy and bipolar radiofrequency ablation  . S/P minimally invasive mitral valve repair + maze procedure 08/14/2013   28 mm Sorin Memo 3D ring annuloplasty via right mini thoracotomy approach  . SOB (shortness of breath)   . Somnolence   . Stenosis of carotid artery   . Tricuspid regurgitation     Past Surgical History:  Procedure  Laterality Date  . CARDIAC CATHETERIZATION  x 2  . CARDIOVERSION  02/26/2010  . CARDIOVERSION N/A 02/18/2020   Procedure: CARDIOVERSION;  Surgeon: Larey Dresser, MD;  Location[facility] ENDOSCOPY;  Service: Cardiovascular;  Laterality: N/A;  . CORONARY ANGIOGRAM  08/06/2013   Procedure: CORONARY ANGIOGRAM;  Surgeon: Lorretta Harp, MD;  Location: [facility] C[facility] LAB;  Service: Cardiovascular;;  . HERNIA REPAIR Bilateral   . INTRAOPERATIVE TRANSESOPHAGEAL ECHOCARDIOGRAM N/A 08/14/2013   Procedure: INTRAOPERATIVE TRANSESOPHAGEAL ECHOCARDIOGRAM;  Surgeon: Rexene Alberts, MD;  Location: [place];  Service: Open Heart Surgery;  Laterality: N/A;  . LUMBAR SPINE SURGERY    . MINIMALLY INVASIVE MAZE PROCEDURE N/A 08/14/2013   Procedure: MINIMALLY INVASIVE MAZE PROCEDURE;  Surgeon: Rexene Alberts, MD;  Location: [place];  Service: Open Heart Surgery;  Laterality: N/A;  . MITRAL VALVE REPLACEMENT Right 08/14/2013   Procedure: MINIMALLY INVASIVE MITRAL VALVE (MV)  REPAIR;  Surgeon: Rexene Alberts, MD;  Location: [place];  Service: Open Heart Surgery;  Laterality: Right;  . TEE WITHOUT CARDIOVERSION N/A 06/29/2016   Procedure: TRANSESOPHAGEAL ECHOCARDIOGRAM (TEE);  Surgeon: Larey Dresser, MD;  Location: [place];  Service: Cardiovascular;  Laterality: N/A;  . TEE WITHOUT CARDIOVERSION N/A 02/18/2020   Procedure: TRANSESOPHAGEAL ECHOCARDIOGRAM (TEE);  Surgeon: Larey Dresser, MD;  Location: [facility] ENDOSCOPY;  Service: Cardiovascular;  Laterality: N/A;  . TONSILLECTOMY  age 9    Family History  Problem Relation Age of Onset  . Diabetes Other   . Hypertension Other   . Coronary artery disease Other     Social History   Tobacco Use  . Smoking status: Former Research scientist (life sciences)  . Smokeless tobacco: Never Used  . Tobacco comment: stopped smoking in his 20's  Vaping Use  . Vaping Use: Never used  Substance Use Topics  . Alcohol use: Yes    Alcohol/week: 14.0 standard drinks    Types: 14 Glasses of wine per week     Comment: 1- 2 glasses of wine a night  . Drug use: No    Prior to Admission medications   Medication Sig Start Date End Date Taking? Authorizing Provider  amiodarone (PACERONE) 200 MG tablet Take 1 tablet (200 mg total) by mouth daily. Please call for office visit-[phone] 08/28/20   Larey Dresser, MD  cholecalciferol (VITAMIN D3) 25 MCG (1000 UNIT) tablet Take 1,000 Units by mouth daily.    [provider]  furosemide (LASIX) 40 MG tablet Take 1 tablet (40 mg total) by mouth 2 (two) times daily. 02/27/20   Larey Dresser, MD  metoprolol succinate (TOPROL-XL) 25 MG 24 hr tablet Take 1 tablet (25 mg total) by mouth daily. Take with or immediately following a meal. 02/13/20   Larey Dresser, MD  Multiple Vitamins-Minerals (MULTIVITAL-M PO) Take 1 tablet by mouth daily.     [provider]  nitroGLYCERIN (NITROSTAT) 0.4 MG SL tablet Place 0.4 mg under the tongue every 5 (five) minutes as needed for chest pain.    [provider]  NON FORMULARY Take 1 capsule by mouth daily. [place] Advanced Nerve Support    [provider]  simvastatin (ZOCOR) 40 MG tablet Take 40 mg by mouth daily.     [provider]  warfarin (COUMADIN) 5 MG tablet Take 5 mg by mouth daily.     [provider]    Allergies Patient has no known allergies.   REVIEW OF SYSTEMS  Negative except as noted here or in the History of Present Illness.   PHYSICAL EXAMINATION  Initial Vital Signs Blood pressure 121/79, pulse (!) 106, temperature 98.6 F (37 C), temperature source Oral, resp. rate 20, height 6' (1.829 m), weight 95.3 kg, SpO2 96 %.  Examination General: Well-developed, well-nourished male in no acute distress; appearance consistent with age of record HENT: normocephalic; atraumatic Eyes: Normal appearance Neck: supple Heart: Irregular rhythm Lungs: clear to auscultation bilaterally Abdomen: soft; nondistended; nontender; bowel sounds  present Extremities: No deformity; full range of motion Neurologic: Awake, alert and oriented; motor function intact in all extremities and symmetric; no facial droop Skin: Warm and dry; tender, fluctuant, violaceous mass of left axilla with surrounding erythema:    Psychiatric: Normal mood and affect   RESULTS  Summary of this visit's results, reviewed and interpreted by myself:   EKG Interpretation  Date/Time:    Ventricular Rate:    PR Interval:    QRS Duration:   QT Interval:    QTC Calculation:   R Axis:     Text Interpretation:        Laboratory Studies: No results found for this or any previous visit (from the past 24 hour(s)). Imaging Studies: No results found.  ED COURSE and MDM  Nursing notes, initial and subsequent vitals signs, including  pulse oximetry, reviewed and interpreted by myself.  Vitals:   09/26/20 1655 09/26/20 1656 09/26/20 1937 09/26/20 2342  BP:  (!) 161/87 (!) 177/79 121/79  Pulse:  60 (!) 57 (!) 106  Resp:  18 18 20   Temp:  98.1 F (36.7 C) 98.6 F (37 C)   TempSrc:  Oral Oral   SpO2:  100% 99% 96%  Weight: 95.3 kg     Height: 6' (1.829 m)      Medications  lidocaine-EPINEPHrine (XYLOCAINE W/EPI) 1 %-1:100000 (with pres) injection (has no administration in time range)  doxycycline (VIBRA-TABS) tablet 100 mg (has no administration in time range)    On I&D the lesion appears to be an infected, hemorrhagic (likely due to the patient being on warfarin) epidermoid cyst.  Fragments of the cyst wall (which could not be excised in its entirety) were collected and sent for pathology.  We will add doxycycline to the Keflex for MRSA coverage.  Contents of cyst were sent for culture.  The patient and his daughter were advised he may remove the packing in 3 days or to return to the ED if symptoms are worsening.   PROCEDURES  Procedures  INCISION AND DRAINAGE Performed by: Karen Chafe Jonica Bickhart Consent: Verbal consent obtained. Risks and benefits:  risks, benefits and alternatives were discussed Preoperative diagnosis: abscess Postoperative diagnosis: Infected, hemorrhagic epidermoid cyst  Body area: Left axilla  Anesthesia: local infiltration  Incision was made with a scalpel.  Local anesthetic: lidocaine 2% with epinephrine  Anesthetic total: 6 ml  Complexity: complex Blunt dissection to break up loculations  Drainage: Hemorrhagic, curd-like material consistent with epidermoid cyst  Drainage amount: Moderate  Packing material: 1/2 in iodoform gauze  Patient tolerance: Patient tolerated the procedure well with no immediate complications.   ED DIAGNOSES     ICD-10-CM   1. Infected epidermoid cyst  L72.0    L08.9   2. Sebaceous cyst of left axilla  L72.3        Reyli Schroth, MD 09/27/20 [phone]

## 2020-09-26 NOTE — ED Triage Notes (Addendum)
Abscess under L arm. Sent from [facility]. Family states the redness is spreading to his arm. He has been taking abx for 24 hours. Scheduled for triple valve replacement next week.

## 2020-09-26 NOTE — ED Provider Notes (Incomplete)
[facility] Provider Note: Georgena Spurling, MD, FACEP  CSN: [id number] MRN: [id number] ARRIVAL: 09/26/20 at 1644 ROOM: [place]  Abscess   HISTORY OF PRESENT ILLNESS  09/26/20 11:46 PM DAYN BARICH is a 86 y.o. male    Past Medical History:  Diagnosis Date  . AF (atrial fibrillation) ([place])   . Allergic rhinitis   . Arthritis   . Atrial fibrillation ([place]) 02/17/2010   Persistent atrial fibrillation    . Chronic back pain   . Chronic kidney disease (CKD)   . CKD (chronic kidney disease)   . Coronary artery disease   . Dyspnea   . Dysrhythmia    history  of afib- not current- 09/11/18  . Elevated PSA   . Erectile dysfunction   . GERD (gastroesophageal reflux disease)   . HOH (hard of hearing)   . Hyperlipidemia   . Hypertension   . Hypogonadism male   . Insomnia   . Left ventricular hypertrophy   . Mitral regurgitation 07/12/2013  . Mitral regurgitation   . Mitral stenosis   . Neuropathy   . Pneumonia    as a child  . Renal disorder   . S/P Maze operation for atrial fibrillation 08/14/2013   Complete bilateral atrial lesion set using cryothermy and bipolar radiofrequency ablation  . S/P minimally invasive mitral valve repair + maze procedure 08/14/2013   28 mm Sorin Memo 3D ring annuloplasty via right mini thoracotomy approach  . SOB (shortness of breath)   . Somnolence   . Stenosis of carotid artery   . Tricuspid regurgitation     Past Surgical History:  Procedure Laterality Date  . CARDIAC CATHETERIZATION  x 2  . CARDIOVERSION  02/26/2010  . CARDIOVERSION N/A 02/18/2020   Procedure: CARDIOVERSION;  Surgeon: Larey Dresser, MD;  Location: [facility] ENDOSCOPY;  Service: Cardiovascular;  Laterality: N/A;  . CORONARY ANGIOGRAM  08/06/2013   Procedure: CORONARY ANGIOGRAM;  Surgeon: Lorretta Harp, MD;  Location: [facility] CATH LAB;  Service: Cardiovascular;;  . HERNIA REPAIR Bilateral   . INTRAOPERATIVE TRANSESOPHAGEAL ECHOCARDIOGRAM N/A  08/14/2013   Procedure: INTRAOPERATIVE TRANSESOPHAGEAL ECHOCARDIOGRAM;  Surgeon: Rexene Alberts, MD;  Location: [facility];  Service: Open Heart Surgery;  Laterality: N/A;  . LUMBAR SPINE SURGERY    . MINIMALLY INVASIVE MAZE PROCEDURE N/A 08/14/2013   Procedure: MINIMALLY INVASIVE MAZE PROCEDURE;  Surgeon: Rexene Alberts, MD;  Location: [place];  Service: Open Heart Surgery;  Laterality: N/A;  . MITRAL VALVE REPLACEMENT Right 08/14/2013   Procedure: MINIMALLY INVASIVE MITRAL VALVE (MV)  REPAIR;  Surgeon: Rexene Alberts, MD;  Location: [place];  Service: Open Heart Surgery;  Laterality: Right;  . TEE WITHOUT CARDIOVERSION N/A 06/29/2016   Procedure: TRANSESOPHAGEAL ECHOCARDIOGRAM (TEE);  Surgeon: Larey Dresser, MD;  Location: [place];  Service: Cardiovascular;  Laterality: N/A;  . TEE WITHOUT CARDIOVERSION N/A 02/18/2020   Procedure: TRANSESOPHAGEAL ECHOCARDIOGRAM (TEE);  Surgeon: Larey Dresser, MD;  Location: [facility] ENDOSCOPY;  Service: Cardiovascular;  Laterality: N/A;  . TONSILLECTOMY  age 17    Family History  Problem Relation Age of Onset  . Diabetes Other   . Hypertension Other   . Coronary artery disease Other     Social History   Tobacco Use  . Smoking status: Former Research scientist (life sciences)  . Smokeless tobacco: Never Used  . Tobacco comment: stopped smoking in his 20's  Vaping Use  . Vaping Use: Never used  Substance Use Topics  .  Alcohol use: Yes    Alcohol/week: 14.0 standard drinks    Types: 14 Glasses of wine per week    Comment: 1- 2 glasses of wine a night  . Drug use: No    Prior to Admission medications   Medication Sig Start Date End Date Taking? Authorizing Provider  amiodarone (PACERONE) 200 MG tablet Take 1 tablet (200 mg total) by mouth daily. Please call for office visit-[phone] 08/28/20   Larey Dresser, MD  cholecalciferol (VITAMIN D3) 25 MCG (1000 UNIT) tablet Take 1,000 Units by mouth daily.    [provider]  furosemide (LASIX) 40 MG tablet Take 1  tablet (40 mg total) by mouth 2 (two) times daily. 02/27/20   Larey Dresser, MD  metoprolol succinate (TOPROL-XL) 25 MG 24 hr tablet Take 1 tablet (25 mg total) by mouth daily. Take with or immediately following a meal. 02/13/20   Larey Dresser, MD  Multiple Vitamins-Minerals (MULTIVITAL-M PO) Take 1 tablet by mouth daily.     [provider]  nitroGLYCERIN (NITROSTAT) 0.4 MG SL tablet Place 0.4 mg under the tongue every 5 (five) minutes as needed for chest pain.    [provider]  NON FORMULARY Take 1 capsule by mouth daily. [place] Advanced Nerve Support    [provider]  simvastatin (ZOCOR) 40 MG tablet Take 40 mg by mouth daily.     [provider]  warfarin (COUMADIN) 5 MG tablet Take 5 mg by mouth daily.     [provider]    Allergies Patient has no known allergies.   REVIEW OF SYSTEMS  Negative except as noted here or in the History of Present Illness.   PHYSICAL EXAMINATION  Initial Vital Signs Blood pressure 121/79, pulse (!) 106, temperature 98.6 F (37 C), temperature source Oral, resp. rate 20, height 6' (1.829 m), weight 95.3 kg, SpO2 96 %.  Examination General: Well-developed, well-nourished male in no acute distress; appearance consistent with age of record HENT: normocephalic; atraumatic Eyes: pupils equal, round and reactive to light; extraocular muscles intact Neck: supple Heart: regular rate and rhythm; no murmurs, rubs or gallops Lungs: clear to auscultation bilaterally Abdomen: soft; nondistended; nontender; no masses or hepatosplenomegaly; bowel sounds present Extremities: No deformity; full range of motion; pulses normal Neurologic: Awake, alert and oriented; motor function intact in all extremities and symmetric; no facial droop Skin: Warm and dry Psychiatric: Normal mood and affect   RESULTS  Summary of this visit's results, reviewed and interpreted by myself:   EKG Interpretation  Date/Time:     Ventricular Rate:    PR Interval:    QRS Duration:   QT Interval:    QTC Calculation:   R Axis:     Text Interpretation:        Laboratory Studies: No results found for this or any previous visit (from the past 24 hour(s)). Imaging Studies: No results found.  ED COURSE and MDM  Nursing notes, initial and subsequent vitals signs, including pulse oximetry, reviewed and interpreted by myself.  Vitals:   09/26/20 1655 09/26/20 1656 09/26/20 1937 09/26/20 2342  BP:  (!) 161/87 (!) 177/79 121/79  Pulse:  60 (!) 57 (!) 106  Resp:  18 18 20   Temp:  98.1 F (36.7 C) 98.6 F (37 C)   TempSrc:  Oral Oral   SpO2:  100% 99% 96%  Weight: 95.3 kg     Height: 6' (1.829 m)      Medications - No data  to display    PROCEDURES  Procedures   ED DIAGNOSES  No diagnosis found.

## 2020-09-27 MED ORDER — LIDOCAINE-EPINEPHRINE 1 %-1:100000 IJ SOLN: 30.0000 mL | Freq: Once | INTRAMUSCULAR | Status: AC

## 2020-09-27 MED ORDER — DOXYCYCLINE HYCLATE 100 MG PO CAPS: 100.0000 mg | ORAL_CAPSULE | Freq: Two times a day (BID) | ORAL | 0 refills | Status: DC

## 2020-09-27 MED ORDER — DOXYCYCLINE HYCLATE 100 MG PO TABS
100.0000 mg | ORAL_TABLET | Freq: Once | ORAL | Status: AC
  Administered 2020-09-27: 100 mg via ORAL
  Filled 2020-09-27: qty 1

## 2020-09-29 LAB — SURGICAL PATHOLOGY

## 2020-09-30 LAB — AEROBIC CULTURE W GRAM STAIN (SUPERFICIAL SPECIMEN)

## 2020-10-20 ENCOUNTER — Telehealth (HOSPITAL_COMMUNITY): Payer: Self-pay

## 2020-10-20 NOTE — Telephone Encounter (Signed)
Called and spoke with in regards to CR, pt asked that I contact his daughter Ileene Rubens. Spoke with Penn who stated she will have a conversation with her father in regards to his interest in CR. Explained scheduling process and went over insurance, she verbalized understanding.

## 2020-10-20 NOTE — Telephone Encounter (Signed)
Pt insurance is active and benefits verified through [facility]. Co-pay $10.00, DED $0.00/$0.00 met, out of pocket $0.00/$0.00 met, co-insurance 0%. No pre-authorization required. Passport, 10/20/20 @ 3:57PM, PKG#[id number]  Will contact patient to see if he is interested in the Cardiac Rehab Program. Will be contacted for scheduling upon review by the RN Navigator.

## 2020-10-21 ENCOUNTER — Encounter (HOSPITAL_COMMUNITY): Payer: Self-pay | Admitting: *Deleted

## 2020-10-21 NOTE — Progress Notes (Signed)
Received referral notification from Dr. Cheree Ditto at [facility] for this pt to participate in Cardiac Rehab s/p TV replacement on 1/7/222.  Pt discharged on 1/20 with home health nurse, PT and OT.  Undetermined length of time. Review admission and discharge summaries in [place].  Pt has upcoming appt with Dr. Cheree Ditto on 10/26/20/  Pt cardiology appt is much further out on 01/07/21. Medical referral order form sent to Dr.Gaca along with request for 12 lead EKG tracing. Once requested documents have been received along with the completion of follow up appt on 1/31 will contact pt daughter (pt is [facility] and prefers contacting  [facility]) for interest and ability to participate in Cardiac rehab consistently. Cherre Huger, BSN Cardiac and Training and development officer

## 2020-11-09 ENCOUNTER — Telehealth (HOSPITAL_COMMUNITY): Payer: Self-pay | Admitting: *Deleted

## 2020-11-09 NOTE — Telephone Encounter (Signed)
Called pt for well being check in.  Pt had thoracentesis completed on 2/7 at [facility].  Pt is doing well and has some shortness of breath however improved from last week.  Pt has home health visits with PT that should end this week per pt.  Talked with pt regarding cardiac rehab here at [facility].  Pt indicated that our location was too far and whether there is a program closer.  Advised that [facility] has a program for cardiac rehab.  Pt indicated that would cut his  travel time in half.  Verbal permission given to send contact information to [facility] Cardiac rehab.  Pt thanked me for checking in with him. Cherre Huger, BSN Cardiac and Training and development officer

## 2021-01-11 ENCOUNTER — Other Ambulatory Visit (HOSPITAL_COMMUNITY): Payer: Self-pay | Admitting: Cardiology

## 2021-02-05 ENCOUNTER — Other Ambulatory Visit (HOSPITAL_COMMUNITY): Payer: Self-pay | Admitting: Cardiology

## 2021-02-23 ENCOUNTER — Other Ambulatory Visit (HOSPITAL_COMMUNITY): Payer: Self-pay | Admitting: Cardiology

## 2021-05-18 ENCOUNTER — Other Ambulatory Visit (HOSPITAL_COMMUNITY): Payer: Self-pay

## 2021-05-18 MED ORDER — METOPROLOL SUCCINATE ER 25 MG PO TB24: 25.0000 mg | ORAL_TABLET | Freq: Every day | ORAL | 1 refills | Status: DC

## 2021-05-19 ENCOUNTER — Ambulatory Visit (HOSPITAL_COMMUNITY)
Admission: RE | Admit: 2021-05-19 | Discharge: 2021-05-19 | Disposition: A | Discharge status: Home / Self Care | Payer: Medicare HMO | Source: Ambulatory Visit | Attending: Family Medicine | Admitting: Family Medicine

## 2021-05-19 ENCOUNTER — Encounter (HOSPITAL_COMMUNITY): Payer: Self-pay

## 2021-05-19 ENCOUNTER — Other Ambulatory Visit: Payer: Self-pay

## 2021-05-19 VITALS — BP 144/80 | HR 81 | Wt 208.2 lb

## 2021-05-19 DIAGNOSIS — Z79899 Other long term (current) drug therapy: Secondary | ICD-10-CM | POA: Insufficient documentation

## 2021-05-19 DIAGNOSIS — N184 Chronic kidney disease, stage 4 (severe): Secondary | ICD-10-CM | POA: Insufficient documentation

## 2021-05-19 DIAGNOSIS — Z8673 Personal history of transient ischemic attack (TIA), and cerebral infarction without residual deficits: Secondary | ICD-10-CM | POA: Insufficient documentation

## 2021-05-19 DIAGNOSIS — Z7901 Long term (current) use of anticoagulants: Secondary | ICD-10-CM | POA: Insufficient documentation

## 2021-05-19 DIAGNOSIS — Z9889 Other specified postprocedural states: Secondary | ICD-10-CM | POA: Diagnosis not present

## 2021-05-19 DIAGNOSIS — Z8249 Family history of ischemic heart disease and other diseases of the circulatory system: Secondary | ICD-10-CM | POA: Diagnosis not present

## 2021-05-19 DIAGNOSIS — I081 Rheumatic disorders of both mitral and tricuspid valves: Secondary | ICD-10-CM | POA: Insufficient documentation

## 2021-05-19 DIAGNOSIS — I4891 Unspecified atrial fibrillation: Secondary | ICD-10-CM | POA: Diagnosis not present

## 2021-05-19 DIAGNOSIS — I440 Atrioventricular block, first degree: Secondary | ICD-10-CM | POA: Diagnosis not present

## 2021-05-19 DIAGNOSIS — I5042 Chronic combined systolic (congestive) and diastolic (congestive) heart failure: Secondary | ICD-10-CM | POA: Diagnosis not present

## 2021-05-19 DIAGNOSIS — I130 Hypertensive heart and chronic kidney disease with heart failure and stage 1 through stage 4 chronic kidney disease, or unspecified chronic kidney disease: Secondary | ICD-10-CM | POA: Diagnosis not present

## 2021-05-19 DIAGNOSIS — E785 Hyperlipidemia, unspecified: Secondary | ICD-10-CM | POA: Diagnosis not present

## 2021-05-19 DIAGNOSIS — I4819 Other persistent atrial fibrillation: Secondary | ICD-10-CM | POA: Insufficient documentation

## 2021-05-19 DIAGNOSIS — I5032 Chronic diastolic (congestive) heart failure: Secondary | ICD-10-CM | POA: Diagnosis not present

## 2021-05-19 MED ORDER — METOPROLOL SUCCINATE ER 25 MG PO TB24: 25.0000 mg | ORAL_TABLET | Freq: Every day | ORAL | 3 refills | Status: AC

## 2021-05-19 NOTE — Patient Instructions (Signed)
STOP Amiodarone  You have been referred to [facility] -they will be in contact with an appointment    At the [facility], you and your health needs are our priority. As part of our continuing mission to provide you with exceptional heart care, we have created designated Provider Care Teams. These Care Teams include your primary Cardiologist (physician) and Advanced Practice Providers (APPs- Physician Assistants and Nurse Practitioners) who all work together to provide you with the care you need, when you need it.   You may see any of the following providers on your designated Care Team at your next follow up: Dr Glori Bickers Dr [name] Dr Patrice Paradise, NP Lyda Jester, [place] Ginnie Smart Audry Riles, PharmD   Please be sure to bring in all your medications bottles to every appointment.

## 2021-05-19 NOTE — Progress Notes (Signed)
PCP: Lennice Sites HF Cardiology: Dr. Aundra Dubin  86 y.o. with history of paroxysmal atrial fibrillation and severe mitral regurgitation s/p minimally invasive MV repair and Maze in 11/14 returns for followup of valvular disease and dyspnea.  He developed increased exertional dyspnea over the 6 months prior to the initial evaluation in this office.  He was short of breath walking a short distance or doing housework like vacuuming.  He also had had a TIA  and was started on apixaban 2.5 mg bid (he had not been anticoagulated since around the time of his prior surgery). He was sent back to Dr Roxy Manns for re-evaluation, there was concern for stenosis of the repaired mitral valve.  He had a TEE in 10/17 showing EF 45-50% range with moderate to moderate-severe tricuspid regurgitation, mildly decreased RV systolic function, and possibly moderate stenosis of the repaired mitral valve (mild to moderate regurgitation).  Most significantly, there was a layered thrombus along the left atrial wall.  I increased his dose of apixaban to 5 mg bid at that time. He was seen in followup by Dr. Roxy Manns, no plan for redo surgery.  Event monitor in 10/17 showed no atrial fibrillation.  Cardiolite was done in 2/18. This showed basal-mid inferolateral fixed defect, but no ischemia, EF 55%. He was transitioned from Eliquis to warfarin given valvular disease.    Echo was done in 10/18 showing EF 55% with mildly decreased RV systolic function; s/p MV repair with mild stenosis and trivial MR.  Most recent echo in 5/21 done at the hospital in [facility] showed EF 50-55%, mild LVH, severely dilated RV, s/p MV repair with mild MR, dilated IVC.   Patient was admitted to [facility] in 5/21 with atrial fibrillation and CHF.  He had been short of breath for 2-3 months.  He was diuresed with IV Lasix and sent home, no DCCV.   He was set up for TEE-guided DCCV.  TEE in 5/21 showed EF 45-50% with diffuse hypokinesis, severely dilated RV with moderately  decreased RV systolic function, severe TR with flail segment, stable repaired mitral valve.  He had DCCV to NSR.   Follow up 6/21 he was still in atrial fibrillation. Amiodarone continued and planned for repeat DCCV. He was not a candidate for a TVR here or at [facility]. He was lost to follow up.   S/p TVR and insertion of PPM in abdomen 1/22 at [facility]. Post-op course was remarkable for RV dysfunction requiring inotropes and CHB s/p insertion of PPM, AKI on CKD IV, and pericardial clot requiring chest exploration and washout. He also underwent left thoracentesis as an outpatient, yielding 2L  Today he returns for HF and atrial fibrillation follow up. He was last seen 6/21, at which time he remained in atrial fibrillation. Asking if he needs to take his amiodarone. Walking more, will get SOB walking further distances. Denies CP, dizziness, edema, or PND/Orthopnea. Appetite ok. No fever or chills. He now sees a nephrologist at [facility], no talks of HD yet.   ECG (personally reviewed): appears SR (device artifact)  Device Interrogation: short bursts of AT/AF? Lasting less than an hour.   Labs (10/17): K 4.8, creatinine 2.08, BNP 43, hgb 15.6 Labs (12/17): K 4.5, creatinine 2.36, HCT 46.3, plts 105 Labs (1/18): K 4.1, creatinine 2.5, LDL 104, BNP 175 Labs (2/18): K 4, creatinine 2.65, BNP 65, HCT 44.2 Labs (3/18): LDL 70, HDL 38 Labs (7/18): K 4.4, creatinine 2.66 Labs (11/18): K 4, creatinine 2.83 Labs (1/19): LDL 53,  HDL 42 Labs (11/19): creatinine 2.6 Labs (5/21): K 3.9, creatinine 3.09 Labs (6/21): K 3.7, creatinine 3.2, hgb 14.7 Labs (1/22): K 3.7, creatinine 3.8  PMH: 1. Mitral regurgitation: s/p minimally invasive mitral valve repair in 11/14 (Dr Roxy Manns).  He has developed stenosis of the repaired valve.  - TEE (10/17) with EF 45-50%, mild diffuse hypokinesis, mildly dilated RV with mildly decreased systolic function, moderate to moderate-severe tricuspid regurgitation, peak RV-RA gradient 31 mmHg,  mild AI, mitral valve s/p repair with mild-moderate MR, posterior leaflet relatively fixed with mean gradient 6 mmHg and MVA 1.22 cm^2 => possible moderate mitral stenosis; there was layered thrombus along the wall of the left atrium.  - Echo (12/17, [place] => images not available): Per report, EF 40-45%, mild LVH, severe LAE, s/p MV repair with "moderate mitral stenosis" but MVA 1.7 cm^2 via PHT, mild MR, moderate TR, PASP 45 mmHg.  - Echo (10/18): EF 55%, mild RV dilation with mildly decreased systolic function, s/p MV repair with mild mitral stenosis (mean gradient 5 mmHg and MVA 1.5 cm^2), trivial MR, trivial TR.  - Echo (11/19): EF 55-60%, s/p MV repair with moderate functional stenosis and mild MR, severe LAE, moderate TR.   - Echo (5/21, [facility]): EF 50-55%, mild LVH, severe RV dilation with normal systolic function, s/p MV repair with mild MR, dilated IVC.  2. TIA: MRI head 10/17 did not show CVA.  - 1/18 carotid dopplers: No significant stenosis.  3. Atrial fibrillation: Paroxysmal.  S/p Maze procedure with MV repair in 11/14.  - Event monitor (10/17): No atrial fibrillation - DCCV to NSR in 5/21 but back in atrial fibrillation by 6/21 appt.  4. CKD stage IV 5. GERD 6. HTN 7. CAD: LHC (2014) with 70% proximal stenosis in a small high diagonal, otherwise no significant disease.  - Cardiolite (6/17): No ischemia or infarction.  - Cardiolite (2/18): EF 55%, no ischemia, fixed basal to mid inferolateral perfusion defect (low risk).  8. Chronic thrombocytopenia 9. Primarily diastolic CHF with RV failure: In setting of severe TR.  - TEE (5/21): EF 45-50%, diffuse hypokinesis, severe RV dilation with moderately decreased systolic function, D-shaped interventricular septum, severe biatrial enlargement, flail tricuspid valve segment with severe TR, s/p MV repair with mean gradient 4 and mild MR, dilated IVC.  - s/p TVR (1/22) at [facility] 10. CHB: MDT PPM (abdominal)  SH: Lives in [place], not married but has girlfriend, 1-2 glasses wine/night, nonsmoker.   Family History  Problem Relation Age of Onset   Diabetes Other    Hypertension Other    Coronary artery disease Other    ROS: All systems reviewed and negative except as per HPI.  Current Outpatient Medications  Medication Sig Dispense Refill   amiodarone (PACERONE) 200 MG tablet Take 1 tablet (200 mg total) by mouth daily. Lat refill without office visit [phone] 30 tablet 0   cholecalciferol (VITAMIN D3) 25 MCG (1000 UNIT) tablet Take 1,000 Units by mouth daily.     furosemide (LASIX) 40 MG tablet Take 1 tablet (40 mg total) by mouth 2 (two) times daily. 60 tablet 5   metoprolol succinate (TOPROL-XL) 25 MG 24 hr tablet Take 1 tablet (25 mg total) by mouth daily. Must keep appointment for further refills 30 tablet 1   Multiple Vitamins-Minerals (MULTIVITAL-M PO) Take 1 tablet by mouth daily.      nitroGLYCERIN (NITROSTAT) 0.4 MG SL tablet Place 0.4 mg under the tongue every 5 (five) minutes as needed  for chest pain.     NON FORMULARY Take 1 capsule by mouth daily. [place] Advanced Nerve Support     simvastatin (ZOCOR) 40 MG tablet Take 40 mg by mouth daily.      warfarin (COUMADIN) 5 MG tablet Take 5 mg by mouth daily.      No current facility-administered medications for this encounter.   BP (!) 144/80   Pulse 81   Wt 94.4 kg (208 lb 3.2 oz)   SpO2 90%   BMI 28.24 kg/m   Wt Readings from Last 3 Encounters:  05/19/21 94.4 kg (208 lb 3.2 oz)  09/26/20 95.3 kg (210 lb)  02/27/20 100.2 kg (221 lb)   General:  NAD. No resp difficulty, elderly HEENT: HOH, bilateral hearing aids in Neck: Supple. No JVD. Carotids 2+ bilat; no bruits. No lymphadenopathy or thryomegaly appreciated. Cor: PMI nondisplaced. Regular rate & rhythm. No rubs, gallops,  Lungs: Clear Abdomen: Soft, nontender, nondistended. No hepatosplenomegaly. No bruits or masses. Good bowel sounds. Extremities: No cyanosis, clubbing, rash,  edema, scattered ecchymosis on arms Neuro: Alert & oriented x 3, cranial nerves grossly intact. Moves all 4 extremities w/o difficulty. Affect pleasant.  Assessment/Plan: 1. Chronic diastolic CHF with RV failure: TEE in 5/21 showed EF 45-50% (chronic) but with severely dilated RV and moderately decreased RV systolic function with D-shaped septum, severe TR with flail segment.  PA pressure estimate was not significantly elevated.  This looks like primary tricuspid valve disease with causing RV failure (?old endocarditis damaging the leaflet - no evidence for active endocarditis). NYHA class II-early III symptoms, he does not appear volume overloaded. - Continue Lasix 40 mg bid.  2. Atrial fibrillation: Persistent.  Had Maze in 11/14.  He was off anticoagulation until a TIA and was started on apixaban 2.5 mg bid.  TEE while on the low dose apixaban showed a layered mural thrombus in the LA.  He was switched over to warfarin, which he continues.  He has been back in atrial fibrillation recently, which may have triggered CHF exacerbation. DCCV was done in 5/21 on amiodarone. Per chart review, he was off amiodarone after his surgery in January. He is AV paced today on ECG. Per chart review, he has been non-pacer dependent SR with 1st degree AVB.  - Stop amio. Unclear why he re-started. - Continue Toprol XL 25 mg daily.  3. Mitral valve disorder: History of mitral regurgitation, now s/p minimally invasive MV repair.  MV was stable on 5/21 TEE.   4. CKD: Stage IV.  Follows with Dr. Alger Simons at [facility].  5. TIA: Suspect Mr Reinoehl had another TIA in 1/18 with transient left-sided weakness.  TEE in 10/17 showed layered LA thrombus.  He had been on Eliquis => transitioned to warfarin.  He was seen by neurology.  - He will continue warfarin.  As above, bridge with Lovenox if it is stopped.  6. Hyperlipidemia: LDL 62 HDL 40  6/21. 7. Tricuspid regurgitation: Severe TR, this looks like a primary problem with partial  flail septal leaflet.  ?Prior endocarditis (no evidence for active infection).  By TEE, estimated PA pressure is only 34 mmHg, this supports a primary TV problem.   Dr. Aundra Dubin reviewed his TEE with Dr. Roxy Manns, both agreed that surgical TV repair would not be a good idea. Dr. Bridgette Habermann at [facility] was contacted regarding percutaneous TV repair trials.  However, his elevated creatinine excluded him from trials. - Now s/p TVR 1/22 at [facility]. He is on Coumadin, INRs managed  by PCP. 8. CHB: s/p MDT PPM (abdominal)  Patient now follows with Nephrology and EP at [facility]. He wishes to transfer his heart failure care to [facility]. Will arrange. Should he wish to return, we will be happy to see him.  [place] FNP 05/19/2021

## 2021-05-26 ENCOUNTER — Telehealth (HOSPITAL_COMMUNITY): Payer: Self-pay | Admitting: Cardiology

## 2021-05-26 NOTE — Telephone Encounter (Signed)
At the request of the patient during most recent OV Referral,demo, and H&P sent to [facility] via fax Fax # [phone]

## 2021-10-04 ENCOUNTER — Encounter (HOSPITAL_BASED_OUTPATIENT_CLINIC_OR_DEPARTMENT_OTHER): Payer: Self-pay | Admitting: *Deleted

## 2021-10-04 ENCOUNTER — Emergency Department (HOSPITAL_BASED_OUTPATIENT_CLINIC_OR_DEPARTMENT_OTHER): Payer: Medicare HMO

## 2021-10-04 ENCOUNTER — Emergency Department (HOSPITAL_BASED_OUTPATIENT_CLINIC_OR_DEPARTMENT_OTHER)
Admission: EM | Admit: 2021-10-04 | Discharge: 2021-10-04 | Disposition: A | Discharge status: Home / Self Care | Payer: Medicare HMO | Attending: Emergency Medicine | Admitting: Emergency Medicine

## 2021-10-04 ENCOUNTER — Other Ambulatory Visit: Payer: Self-pay

## 2021-10-04 DIAGNOSIS — N189 Chronic kidney disease, unspecified: Secondary | ICD-10-CM | POA: Insufficient documentation

## 2021-10-04 DIAGNOSIS — R519 Headache, unspecified: Secondary | ICD-10-CM | POA: Diagnosis not present

## 2021-10-04 DIAGNOSIS — I129 Hypertensive chronic kidney disease with stage 1 through stage 4 chronic kidney disease, or unspecified chronic kidney disease: Secondary | ICD-10-CM | POA: Diagnosis not present

## 2021-10-04 DIAGNOSIS — Z79899 Other long term (current) drug therapy: Secondary | ICD-10-CM | POA: Insufficient documentation

## 2021-10-04 DIAGNOSIS — I2510 Atherosclerotic heart disease of native coronary artery without angina pectoris: Secondary | ICD-10-CM | POA: Diagnosis not present

## 2021-10-04 DIAGNOSIS — W1811XA Fall from or off toilet without subsequent striking against object, initial encounter: Secondary | ICD-10-CM | POA: Insufficient documentation

## 2021-10-04 DIAGNOSIS — I4891 Unspecified atrial fibrillation: Secondary | ICD-10-CM | POA: Insufficient documentation

## 2021-10-04 DIAGNOSIS — M542 Cervicalgia: Secondary | ICD-10-CM | POA: Diagnosis not present

## 2021-10-04 DIAGNOSIS — W19XXXA Unspecified fall, initial encounter: Secondary | ICD-10-CM

## 2021-10-04 DIAGNOSIS — Z7901 Long term (current) use of anticoagulants: Secondary | ICD-10-CM | POA: Insufficient documentation

## 2021-10-04 MED ORDER — LIDOCAINE 5 % EX PTCH
1.0000 | MEDICATED_PATCH | CUTANEOUS | 0 refills | Status: AC
Start: 2021-10-04 — End: ?

## 2021-10-04 MED ORDER — LIDOCAINE 5 % EX PTCH
2.0000 | MEDICATED_PATCH | CUTANEOUS | Status: DC
  Administered 2021-10-04: 2 via TRANSDERMAL
  Filled 2021-10-04: qty 2

## 2021-10-04 MED ORDER — OXYCODONE-ACETAMINOPHEN 5-325 MG PO TABS: 1.0000 | ORAL_TABLET | Freq: Four times a day (QID) | ORAL | 0 refills | Status: AC | PRN

## 2021-10-04 MED ORDER — OXYCODONE-ACETAMINOPHEN 5-325 MG PO TABS
1.0000 | ORAL_TABLET | Freq: Once | ORAL | Status: AC
  Administered 2021-10-04: 1 via ORAL
  Filled 2021-10-04: qty 1

## 2021-10-04 NOTE — ED Notes (Signed)
Pt standing at door ready for discharge, unable to obtain vitals at time of discharge, NAD noted

## 2021-10-04 NOTE — ED Provider Notes (Signed)
[facility] Provider Note   CSN: [id number] Arrival date & time: 10/04/21  1703     History  Chief Complaint  Patient presents with   Daniel Mayo is a 87 y.o. male who presents with his daughter, Daniel Mayo, at the bedside with concern for severe right-sided neck pain and headaches for the last 9 days after a fall.  The neck pain and muscle spasms more significant in the last 3 days.  He is anticoagulated on warfarin for atrial fibrillation.  He did not seek care the day he fell.  He states that he was getting up to go to the restroom when he slipped on the floor and fell into the nearby.  Furniture.  He denies any head trauma, LOC, nausea, vomiting, blurred or double vision.  According to his daughter at bedside she did not hear from him for the last several weeks which is normal for them until he called her today requesting to come to the emergency department.  Patient denies any blurry or double vision, but is unwilling to discuss his symptoms any further.  He does state that his only concern is his right-sided neck pain.  He has been taking hydrocodone provided to him by a friend who has a prescription for this medication and that that is the only thing that has helped him.  He is unable to tell me what else he has tried at home to help with his discomfort.  He does not have a prescription for narcotics on his own.  I have personally reviewed this patient's medical records he has.  He has history of atrial fibrillation, hyperlipidemia, mitral regurg, tricuspid regurg, status post maze operation for his atrial fibrillation, CKD, CAD, hypertension, insomnia, mitral stenosis, and hardness of hearing.  HPI     Home Medications Prior to Admission medications   Medication Sig Start Date End Date Taking? Authorizing Provider  cholecalciferol (VITAMIN D3) 25 MCG (1000 UNIT) tablet Take 1,000 Units by mouth daily.    [provider]  furosemide  (LASIX) 40 MG tablet Take 1 tablet (40 mg total) by mouth 2 (two) times daily. 02/27/20   Larey Dresser, MD  metoprolol succinate (TOPROL-XL) 25 MG 24 hr tablet Take 1 tablet (25 mg total) by mouth daily. Must keep appointment for further refills 05/19/21   Rafael Bihari, FNP  Multiple Vitamins-Minerals (MULTIVITAL-M PO) Take 1 tablet by mouth daily.     [provider]  nitroGLYCERIN (NITROSTAT) 0.4 MG SL tablet Place 0.4 mg under the tongue every 5 (five) minutes as needed for chest pain.    [provider]  NON FORMULARY Take 1 capsule by mouth daily. [facility]    [provider]  simvastatin (ZOCOR) 40 MG tablet Take 40 mg by mouth daily.     [provider]  warfarin (COUMADIN) 5 MG tablet Take 5 mg by mouth daily.     [provider]      Allergies    Patient has no known allergies.    Review of Systems   Review of Systems  Constitutional: Negative.   HENT: Negative.    Eyes: Negative.   Respiratory: Negative.    Genitourinary: Negative.   Musculoskeletal:  Positive for myalgias and neck stiffness.  Neurological:  Positive for headaches.  Hematological:  Bruises/bleeds easily.   Physical Exam Updated Vital Signs BP (!) 153/85 (BP Location: Left Arm)    Pulse 80  Temp 98.1 F (36.7 C) (Oral)    Resp 17    Ht 6' (1.829 m)    Wt 93 kg    SpO2 100%    BMI 27.80 kg/m  Physical Exam Vitals and nursing note reviewed.  Constitutional:      Appearance: He is not ill-appearing or toxic-appearing.  HENT:     Head: Normocephalic and atraumatic.     Right Ear: Tympanic membrane normal.     Left Ear: Tympanic membrane normal.     Nose: Nose normal.     Mouth/Throat:     Mouth: Mucous membranes are moist.     Pharynx: Oropharynx is clear. Uvula midline. No oropharyngeal exudate or posterior oropharyngeal erythema.  Eyes:     General: Lids are normal. Vision grossly intact.        Right eye: No discharge.         Left eye: No discharge.     Extraocular Movements: Extraocular movements intact.     Conjunctiva/sclera: Conjunctivae normal.     Pupils: Pupils are equal, round, and reactive to light.  Neck:     Trachea: Trachea and phonation normal.     Comments: Patient unwilling to attempt to range the neck due to pain.  Spasm and tenderness to palpation of the bilateral paraspinous musculature along the C-spine, worse on the right than the left.  Spasm and tenderness palpation over the right trapezius as well.  There is no bruising, deformity, or skin damage to the neck circumferentially Cardiovascular:     Rate and Rhythm: Normal rate and regular rhythm.     Pulses: Normal pulses.  Pulmonary:     Effort: Pulmonary effort is normal. No respiratory distress.     Breath sounds: Normal breath sounds. No wheezing or rales.  Abdominal:     General: Bowel sounds are normal. There is no distension.     Tenderness: There is no abdominal tenderness.  Musculoskeletal:        General: No deformity.     Cervical back: Neck supple. Tenderness present. No edema or crepitus. Pain with movement, spinous process tenderness and muscular tenderness present. Decreased range of motion.     Right lower leg: No edema.     Left lower leg: No edema.  Lymphadenopathy:     Cervical: No cervical adenopathy.  Skin:    General: Skin is warm and dry.     Capillary Refill: Capillary refill takes less than 2 seconds.  Neurological:     General: No focal deficit present.     Mental Status: He is alert and oriented to person, place, and time. Mental status is at baseline.     GCS: GCS eye subscore is 4. GCS verbal subscore is 5. GCS motor subscore is 6.     Cranial Nerves: Cranial nerves 2-12 are intact.     Sensory: Sensation is intact.     Motor: Motor function is intact.     Coordination: Coordination is intact.     Gait: Gait is intact.  Psychiatric:        Attention and Perception: Attention normal.        Mood and  Affect: Mood normal. Affect is angry.        Behavior: Behavior is agitated.     Comments: Does not appear to be responding to internal stimuli    ED Results / Procedures / Treatments   Labs (all labs ordered are listed, but only abnormal results are displayed) Labs Reviewed -  No data to display  EKG None  Radiology CT Head Wo Contrast  Result Date: 10/04/2021 CLINICAL DATA:  Head trauma, minor (Age >= 65y) EXAM: CT HEAD WITHOUT CONTRAST TECHNIQUE: Contiguous axial images were obtained from the base of the skull through the vertex without intravenous contrast. COMPARISON:  None. BRAIN: BRAIN Cerebral ventricle sizes are concordant with the degree of cerebral volume loss. Patchy and confluent areas of decreased attenuation are noted throughout the deep and periventricular white matter of the cerebral hemispheres bilaterally, compatible with chronic microvascular ischemic disease. No evidence of large-territorial acute infarction. No parenchymal hemorrhage. No mass lesion. No extra-axial collection. No mass effect or midline shift. No hydrocephalus. Basilar cisterns are patent. Vascular: No hyperdense vessel. Atherosclerotic calcifications are present within the cavernous internal carotid arteries. Skull: No acute fracture or focal lesion. Sinuses/Orbits: Paranasal sinuses and mastoid air cells are clear. The orbits are unremarkable. Other: None. IMPRESSION: No acute intracranial abnormality. Electronically Signed   By: Iven Finn M.D.   On: 10/04/2021 19:04   CT Cervical Spine Wo Contrast  Result Date: 10/04/2021 CLINICAL DATA:  Fall. EXAM: CT CERVICAL SPINE WITHOUT CONTRAST TECHNIQUE: Multidetector CT imaging of the cervical spine was performed without intravenous contrast. Multiplanar CT image reconstructions were also generated. COMPARISON:  Cervical spine x-ray 10/06/2017. MRI cervical spine [id number]. FINDINGS: Alignment: Normal. There is straightening of normal cervical lordosis. Skull base  and vertebrae: No acute fracture. No primary bone lesion or focal pathologic process. Soft tissues and spinal canal: No prevertebral fluid or swelling. No visible canal hematoma. Disc levels: There is moderate severe disc space narrowing throughout the cervical spine compatible degenerative change. There is severe left neural foraminal stenosis at C3-C4 secondary to facet arthropathy and uncovertebral spurring. Otherwise, there is mild to moderate diffuse bilateral neural foraminal stenosis throughout the cervical spine secondary to uncovertebral spurring. Posterior disc osteophyte complex causes mild central canal stenosis at C5-C6. Upper chest: There are emphysematous changes apices. Other: None. IMPRESSION: 1. No acute fracture or traumatic subluxation of the cervical spine. Moderate degenerative changes throughout the cervical spine. Electronically Signed   By: Ronney Asters M.D.   On: 10/04/2021 19:07    Procedures Procedures    Medications Ordered in ED Medications  lidocaine (LIDODERM) 5 % 2 patch (2 patches Transdermal Patch Applied 10/04/21 1856)    ED Course/ Medical Decision Making/ A&P                           Medical Decision Making This patient presents to the ED for concern of neck pain and headache, this involves an extensive number of treatment options, and is a complaint that carries with it a high risk of complications and morbidity.  The differential diagnosis includes but is not limited to acute osseous abnormality such as a fracture or dislocation, muscle spasm or strain, contusion, intracranial hemorrhage.  Vital signs are normal on intake.  Cardiopulmonary exam is normal, abdominal exam is benign.  Head is without evidence of trauma.  Patient with extensive muscular spasm and tenderness palpation of the paraspinous muscles of the C-spine and trapezius bilaterally, right greater than left.  Nonfocal neurologic exam.  Imaging was reviewed and interpreted by me as above and is  negative for acute intracranial abnormality or osseous injury that is acute for the patient.  Does have extensive cervical stenosis for which he should follow-up in the outpatient setting.  I have reviewed and agree with the radiologists reading of  this imaging study.  Lidoderm patch offered as well as stable dose of oxycodone.  Patient remains quite angry, agitated, expressing wishes to be discharged home.  Given lack of neurologic findings on exam as well as reassuring imaging, do not feel any further work-up is necessary in the emergency department at this time.  Suspect acute muscle spasm from trauma of his recent fall.  He may continue to use topical analgesia or Tylenol as needed.  Will provide a few pills of Percocet to allow the patient to rest.  Recommend he follows up closely with his PCP with whom he has an appointment tomorrow afternoon.  He was encouraged to keep this appointment.  Farrel and his daughter voiced understanding of his medical evaluation and treatment plan.  Each of their questions was answered to their expressed satisfaction.  Return precautions were given.  Patient is stable hemodynamically and was discharged home in good condition.  Comorbidities that complicate the patient evaluation: Atrial fibrillation Chronic back pain Hardness of hearing   Additional history obtained:  Additional history obtained from daughter at the bedside External records from outside source obtained and reviewed including none   Lab Tests:  None  Imaging Studies ordered:  I ordered imaging studies including CT head and C-spine I independently visualized and interpreted imaging which showed no acute traumatic finding I agree with the radiologist interpretation   Cardiac Monitoring:  None  Medicines ordered and prescription drug management:  I ordered medication including Lidoderm patch and Percocet for neck pain Reevaluation of the patient after these medicines showed that the  patient stayed the same, however patient discharged shortly after administration of these analgesic medications at his request I have reviewed the patients home medicines and have made adjustments as needed  Tests Considered: MRI of the brain, however given lack of neurologic findings on exam and normal CT of the head, no further work-up was determined to be necessary   Critical Interventions:  None   Consultations Obtained:  None  Problem List / ED Course: Headache and neck pain    Reevaluation:  After the interventions noted above, I reevaluated the patient and found that they have :improved  Social Determinants of Health Lives alone, has advanced age and multiple chronic medical conditions.  He is at high risk for falls given history of chronic back pain  Dispostion:  After consideration of the diagnostic results and the patients response to treatment feel that the patent would benefit from close outpatient follow-up after discharge home.  No acute findings to warrant further work-up or admission to the hospital today.  HPI and physical exam most consistent with posttraumatic muscle spasms.  Chetan voiced understanding of his medical evaluation and treatment plan, and she was questions answered to his expressed satisfaction.  He was discharged home in good condition..   This chart was dictated using voice recognition software, Dragon. Despite the best efforts of this provider to proofread and correct errors, errors may still occur which can change documentation meaning.    Final Clinical Impression(s) / ED Diagnoses Final diagnoses:  None    Rx / DC Orders ED Discharge Orders     None         Aura Dials 10/05/21 1738    Blanchie Dessert, MD 10/08/21 0715

## 2021-10-04 NOTE — ED Notes (Signed)
Patient transported to CT 

## 2021-10-04 NOTE — Discharge Instructions (Addendum)
You were seen in the emergency department today for your neck pain.  Your physical exam and vital signs are very reassuring.  The muscles in your neck and shoulders are in what is called spasm, meaning they are inappropriately tightened up.  This can be quite painful.  To help with your pain you may take Tylenol or the prescribed pain medication.   You may also utilize topical pain relief such as Biofreeze, IcyHot, or topical lidocaine patches.  I also recommend that you apply heat to the area, such as a hot shower or heating pad, and follow heat application with massage of the muscles that are most tight.  Follow up with your PCP tomorrow as scheduled.   Please return to the emergency department if you develop any numbness/tingling/weakness in your arms or legs, any difficulty urinating, or urinary incontinence chest pain, shortness of breath, abdominal pain, nausea or vomiting that does not stop, or any other new severe symptoms.

## 2021-10-04 NOTE — ED Triage Notes (Signed)
Lost his balance and fell 9 days ago. Pain in his left shoulder. Head pain 3 days ago. He takes blood thinners.

## 2024-01-25 DEATH — deceased
